# Patient Record
Sex: Female | Born: 1965
Health system: Southern US, Community
[De-identification: ages and names within clinical notes are randomized; demographics above are authoritative.]

## PROBLEM LIST (undated history)

## (undated) DIAGNOSIS — Z720 Tobacco use: Secondary | ICD-10-CM

## (undated) DIAGNOSIS — I429 Cardiomyopathy, unspecified: Secondary | ICD-10-CM

## (undated) DIAGNOSIS — I10 Essential (primary) hypertension: Secondary | ICD-10-CM

## (undated) HISTORY — DX: Cardiomyopathy, unspecified: I42.9

## (undated) HISTORY — DX: Tobacco use: Z72.0

## (undated) HISTORY — PX: CARDIAC CATHETERIZATION: SHX172

## (undated) HISTORY — DX: Essential (primary) hypertension: I10

---

## 2006-12-12 ENCOUNTER — Emergency Department: Payer: Self-pay | Admitting: Unknown Physician Specialty

## 2017-08-29 ENCOUNTER — Telehealth: Payer: Self-pay | Admitting: Oncology

## 2017-08-29 NOTE — Telephone Encounter (Signed)
08/24/17 [facility] phone message. pls call pts daughter

## 2017-08-29 NOTE — Telephone Encounter (Signed)
Please disregard message. Sent in error

## 2017-11-25 ENCOUNTER — Encounter: Payer: Self-pay | Admitting: Emergency Medicine

## 2017-11-25 ENCOUNTER — Observation Stay
Admission: EM | Admit: 2017-11-25 | Discharge: 2017-11-26 | Disposition: A | Discharge status: Home / Self Care | Payer: BLUE CROSS/BLUE SHIELD | Attending: Internal Medicine | Admitting: Internal Medicine

## 2017-11-25 ENCOUNTER — Other Ambulatory Visit: Payer: Self-pay

## 2017-11-25 DIAGNOSIS — F17210 Nicotine dependence, cigarettes, uncomplicated: Secondary | ICD-10-CM | POA: Insufficient documentation

## 2017-11-25 DIAGNOSIS — J309 Allergic rhinitis, unspecified: Secondary | ICD-10-CM | POA: Insufficient documentation

## 2017-11-25 DIAGNOSIS — R7989 Other specified abnormal findings of blood chemistry: Secondary | ICD-10-CM

## 2017-11-25 DIAGNOSIS — E669 Obesity, unspecified: Secondary | ICD-10-CM | POA: Insufficient documentation

## 2017-11-25 DIAGNOSIS — Z7982 Long term (current) use of aspirin: Secondary | ICD-10-CM | POA: Diagnosis not present

## 2017-11-25 DIAGNOSIS — E785 Hyperlipidemia, unspecified: Secondary | ICD-10-CM | POA: Insufficient documentation

## 2017-11-25 DIAGNOSIS — I10 Essential (primary) hypertension: Secondary | ICD-10-CM | POA: Diagnosis not present

## 2017-11-25 DIAGNOSIS — J209 Acute bronchitis, unspecified: Secondary | ICD-10-CM | POA: Diagnosis not present

## 2017-11-25 DIAGNOSIS — F17200 Nicotine dependence, unspecified, uncomplicated: Secondary | ICD-10-CM | POA: Diagnosis not present

## 2017-11-25 DIAGNOSIS — J019 Acute sinusitis, unspecified: Secondary | ICD-10-CM

## 2017-11-25 DIAGNOSIS — R946 Abnormal results of thyroid function studies: Secondary | ICD-10-CM | POA: Diagnosis not present

## 2017-11-25 DIAGNOSIS — Z79899 Other long term (current) drug therapy: Secondary | ICD-10-CM | POA: Diagnosis not present

## 2017-11-25 DIAGNOSIS — R748 Abnormal levels of other serum enzymes: Secondary | ICD-10-CM | POA: Insufficient documentation

## 2017-11-25 DIAGNOSIS — I160 Hypertensive urgency: Principal | ICD-10-CM | POA: Insufficient documentation

## 2017-11-25 DIAGNOSIS — R778 Other specified abnormalities of plasma proteins: Secondary | ICD-10-CM

## 2017-11-25 DIAGNOSIS — Z6831 Body mass index (BMI) 31.0-31.9, adult: Secondary | ICD-10-CM | POA: Diagnosis not present

## 2017-11-25 LAB — CBC WITH DIFFERENTIAL/PLATELET
BASOS ABS: 0 10*3/uL (ref 0–0.1)
BASOS PCT: 1 %
EOS PCT: 1 %
Eosinophils Absolute: 0 10*3/uL (ref 0–0.7)
HCT: 49.7 % — ABNORMAL HIGH (ref 35.0–47.0)
Hemoglobin: 16.7 g/dL — ABNORMAL HIGH (ref 12.0–16.0)
Lymphocytes Relative: 22 %
Lymphs Abs: 0.6 10*3/uL — ABNORMAL LOW (ref 1.0–3.6)
MCH: 32 pg (ref 26.0–34.0)
MCHC: 33.5 g/dL (ref 32.0–36.0)
MCV: 95.6 fL (ref 80.0–100.0)
MONO ABS: 0.2 10*3/uL (ref 0.2–0.9)
Monocytes Relative: 7 %
Neutro Abs: 2 10*3/uL (ref 1.4–6.5)
Neutrophils Relative %: 69 %
PLATELETS: 111 10*3/uL — AB (ref 150–440)
RBC: 5.2 MIL/uL (ref 3.80–5.20)
RDW: 13.2 % (ref 11.5–14.5)
WBC: 2.9 10*3/uL — ABNORMAL LOW (ref 3.6–11.0)

## 2017-11-25 LAB — URINALYSIS, COMPLETE (UACMP) WITH MICROSCOPIC
Bacteria, UA: NONE SEEN
Bilirubin Urine: NEGATIVE
GLUCOSE, UA: NEGATIVE mg/dL
KETONES UR: 5 mg/dL — AB
LEUKOCYTES UA: NEGATIVE
Nitrite: NEGATIVE
PROTEIN: NEGATIVE mg/dL
Specific Gravity, Urine: 1.01 (ref 1.005–1.030)
WBC, UA: NONE SEEN WBC/hpf (ref 0–5)
pH: 6 (ref 5.0–8.0)

## 2017-11-25 LAB — TSH
TSH: 3.717 u[IU]/mL (ref 0.350–4.500)
TSH: 5.503 u[IU]/mL — ABNORMAL HIGH (ref 0.350–4.500)

## 2017-11-25 LAB — COMPREHENSIVE METABOLIC PANEL
ALBUMIN: 4.5 g/dL (ref 3.5–5.0)
ALK PHOS: 86 U/L (ref 38–126)
ALT: 24 U/L (ref 14–54)
ANION GAP: 9 (ref 5–15)
AST: 31 U/L (ref 15–41)
BILIRUBIN TOTAL: 0.8 mg/dL (ref 0.3–1.2)
BUN: 10 mg/dL (ref 6–20)
CALCIUM: 9.2 mg/dL (ref 8.9–10.3)
CO2: 26 mmol/L (ref 22–32)
CREATININE: 0.74 mg/dL (ref 0.44–1.00)
Chloride: 100 mmol/L — ABNORMAL LOW (ref 101–111)
GFR calc non Af Amer: >60 mL/min (ref >60)
GLUCOSE: 123 mg/dL — AB (ref 65–99)
Potassium: 4 mmol/L (ref 3.5–5.1)
SODIUM: 135 mmol/L (ref 135–145)
TOTAL PROTEIN: 7.6 g/dL (ref 6.5–8.1)

## 2017-11-25 LAB — URINE DRUG SCREEN, QUALITATIVE (ARMC ONLY)
AMPHETAMINES, UR SCREEN: NOT DETECTED
BENZODIAZEPINE, UR SCRN: NOT DETECTED
Barbiturates, Ur Screen: NOT DETECTED
Cannabinoid 50 Ng, Ur ~~LOC~~: NOT DETECTED
Cocaine Metabolite,Ur ~~LOC~~: NOT DETECTED
MDMA (ECSTASY) UR SCREEN: NOT DETECTED
METHADONE SCREEN, URINE: NOT DETECTED
OPIATE, UR SCREEN: NOT DETECTED
Phencyclidine (PCP) Ur S: NOT DETECTED
Tricyclic, Ur Screen: NOT DETECTED

## 2017-11-25 LAB — RAPID HIV SCREEN (HIV 1/2 AB+AG)
HIV 1/2 ANTIBODIES: NONREACTIVE
HIV-1 P24 Antigen - HIV24: NONREACTIVE

## 2017-11-25 LAB — TROPONIN I
Troponin I: 0.07 ng/mL (ref <0.03)
Troponin I: 0.07 ng/mL (ref <0.03)

## 2017-11-25 MED ORDER — ACETAMINOPHEN 650 MG RE SUPP: 650.0000 mg | Freq: Four times a day (QID) | RECTAL | Status: DC | PRN

## 2017-11-25 MED ORDER — NICOTINE 14 MG/24HR TD PT24
14.0000 mg | MEDICATED_PATCH | Freq: Every day | TRANSDERMAL | Status: DC
  Filled 2017-11-25: qty 1

## 2017-11-25 MED ORDER — LABETALOL HCL 5 MG/ML IV SOLN
20.0000 mg | Freq: Once | INTRAVENOUS | Status: AC
  Administered 2017-11-25: 20 mg via INTRAVENOUS

## 2017-11-25 MED ORDER — ACETAMINOPHEN 325 MG PO TABS: 650.0000 mg | ORAL_TABLET | Freq: Four times a day (QID) | ORAL | Status: DC | PRN

## 2017-11-25 MED ORDER — DIAZEPAM 5 MG PO TABS
5.0000 mg | ORAL_TABLET | Freq: Once | ORAL | Status: AC
Start: 2017-11-25 — End: 2017-11-25
  Administered 2017-11-25: 5 mg via ORAL
  Filled 2017-11-25: qty 1

## 2017-11-25 MED ORDER — LABETALOL HCL 5 MG/ML IV SOLN
INTRAVENOUS | Status: AC
  Administered 2017-11-25: 20 mg via INTRAVENOUS
  Filled 2017-11-25: qty 4

## 2017-11-25 MED ORDER — LORATADINE 10 MG PO TABS
10.0000 mg | ORAL_TABLET | Freq: Every day | ORAL | Status: DC
  Administered 2017-11-26: 10 mg via ORAL
  Filled 2017-11-25: qty 1

## 2017-11-25 MED ORDER — ASPIRIN 81 MG PO CHEW
CHEWABLE_TABLET | ORAL | Status: AC
  Filled 2017-11-25: qty 4

## 2017-11-25 MED ORDER — POLYETHYLENE GLYCOL 3350 17 G PO PACK: 17.0000 g | PACK | Freq: Every day | ORAL | Status: DC | PRN

## 2017-11-25 MED ORDER — ONDANSETRON HCL 4 MG/2ML IJ SOLN: 4.0000 mg | Freq: Four times a day (QID) | INTRAMUSCULAR | Status: DC | PRN

## 2017-11-25 MED ORDER — METOPROLOL TARTRATE 25 MG/10 ML ORAL SUSPENSION: 50.0000 mg | Freq: Two times a day (BID) | ORAL | Status: DC

## 2017-11-25 MED ORDER — MORPHINE SULFATE (PF) 2 MG/ML IV SOLN: 2.0000 mg | INTRAVENOUS | Status: DC | PRN

## 2017-11-25 MED ORDER — ASPIRIN EC 81 MG PO TBEC
81.0000 mg | DELAYED_RELEASE_TABLET | Freq: Every day | ORAL | Status: DC
  Administered 2017-11-25 – 2017-11-26 (×2): 81 mg via ORAL
  Filled 2017-11-25 (×2): qty 1

## 2017-11-25 MED ORDER — NITROGLYCERIN 0.4 MG SL SUBL: 0.4000 mg | SUBLINGUAL_TABLET | SUBLINGUAL | Status: DC | PRN

## 2017-11-25 MED ORDER — IBUPROFEN 400 MG PO TABS: 400.0000 mg | ORAL_TABLET | Freq: Four times a day (QID) | ORAL | Status: DC | PRN

## 2017-11-25 MED ORDER — LISINOPRIL 10 MG PO TABS
10.0000 mg | ORAL_TABLET | Freq: Two times a day (BID) | ORAL | Status: DC
  Administered 2017-11-25 – 2017-11-26 (×2): 10 mg via ORAL
  Filled 2017-11-25 (×2): qty 1

## 2017-11-25 MED ORDER — ENOXAPARIN SODIUM 40 MG/0.4ML ~~LOC~~ SOLN
40.0000 mg | SUBCUTANEOUS | Status: DC
  Administered 2017-11-25: 40 mg via SUBCUTANEOUS
  Filled 2017-11-25: qty 0.4

## 2017-11-25 MED ORDER — ONDANSETRON HCL 4 MG PO TABS: 4.0000 mg | ORAL_TABLET | Freq: Four times a day (QID) | ORAL | Status: DC | PRN

## 2017-11-25 MED ORDER — SALINE SPRAY 0.65 % NA SOLN
1.0000 | NASAL | Status: DC | PRN
  Administered 2017-11-26: 1 via NASAL
  Filled 2017-11-25: qty 44

## 2017-11-25 MED ORDER — HYDRALAZINE HCL 20 MG/ML IJ SOLN: 10.0000 mg | INTRAMUSCULAR | Status: DC | PRN

## 2017-11-25 MED ORDER — ASPIRIN 81 MG PO CHEW
324.0000 mg | CHEWABLE_TABLET | Freq: Once | ORAL | Status: AC
  Administered 2017-11-25: 324 mg via ORAL

## 2017-11-25 MED ORDER — METOPROLOL TARTRATE 50 MG PO TABS
50.0000 mg | ORAL_TABLET | Freq: Two times a day (BID) | ORAL | Status: DC
  Administered 2017-11-25 – 2017-11-26 (×2): 50 mg via ORAL
  Filled 2017-11-25 (×2): qty 1

## 2017-11-25 NOTE — ED Provider Notes (Signed)
[facility] Emergency Department Provider Note       Time seen: ----------------------------------------- 3:45 PM on 11/25/2017 -----------------------------------------   I have reviewed the triage vital signs and the nursing notes.  HISTORY   Chief Complaint Hypertension    HPI Victoria Avery is a 51 y.o. female with no significant past medical history who presents to the ED for hypertension.  [facility] for [facility] problems where she is having pressure in her sinuses, ear pain and runny nose.  Patient states she does not have a primary care doctor and does not know if she actually has high blood pressure.  She was found to have high blood pressure at the clinic today and was sent to the ER for further evaluation.  Blood pressure was well into the 200s when it was checked there.  She denies any fevers, chills, chest pain, shortness of breath or other complaints at this time.  History reviewed. No pertinent past medical history.  There are no active problems to display for this patient.   Past Surgical History:  Procedure Laterality Date  . CESAREAN SECTION      Allergies Patient has no known allergies.  Social History Social History   Tobacco Use  . Smoking status: Current Every Day Smoker    Packs/day: 1.00    Types: Cigarettes  . Smokeless tobacco: Never Used  Substance Use Topics  . Alcohol use: Yes  . Drug use: No    Review of Systems Constitutional: Negative for fever. Eyes: Negative for vision changes ENT: Positive for sinus congestion and pressure Cardiovascular: Negative for chest pain. Respiratory: Negative for shortness of breath. Gastrointestinal: Negative for abdominal pain, vomiting and diarrhea. Genitourinary: Negative for dysuria. Musculoskeletal: Negative for back pain. Skin: Negative for rash. Neurological: Negative for headaches, focal weakness or numbness.  All systems  negative/normal/unremarkable except as stated in the HPI  ____________________________________________   PHYSICAL EXAM:  VITAL SIGNS: ED Triage Vitals  Enc Vitals Group     BP 11/25/17 1254 (!) 197/137     Pulse Rate 11/25/17 1254 (!) 117     Resp 11/25/17 1254 18     Temp 11/25/17 1254 99.6 F (37.6 C)     Temp Source 11/25/17 1254 Oral     SpO2 11/25/17 1254 97 %     Weight 11/25/17 1255 180 lb (81.6 kg)     Height 11/25/17 1255 5\' 3"  (1.6 m)     Head Circumference --      Peak Flow --      Pain Score --      Pain Loc --      Pain Edu? --      Excl. in GC? --     Constitutional: Alert and oriented. Well appearing and in no distress. Eyes: Conjunctivae are normal. Normal extraocular movements. ENT   Head: Normocephalic and atraumatic.   Nose: No congestion/rhinnorhea.   Mouth/Throat: Mucous membranes are moist.   Neck: No stridor. Cardiovascular: Rapid rate, regular rhythm. No murmurs, rubs, or gallops. Respiratory: Normal respiratory effort without tachypnea nor retractions. Breath sounds are clear and equal bilaterally. No wheezes/rales/rhonchi. Gastrointestinal: Soft and nontender. Normal bowel sounds Musculoskeletal: Nontender with normal range of motion in extremities. No lower extremity tenderness nor edema. Neurologic:  Normal speech and language. No gross focal neurologic deficits are appreciated.  Skin:  Skin is warm, dry and intact. No rash noted. Psychiatric: Mood and affect are normal. Speech and behavior are  normal.  ____________________________________________  EKG: Interpreted by me.  Sinus tachycardia with a rate of 121 bpm, normal PR interval, normal QRS, normal QT.  Nonspecific T wave changes.  ____________________________________________  ED COURSE:  As part of my medical decision making, I reviewed the following data within the electronic MEDICAL RECORD NUMBER History obtained from family if available, nursing notes, old chart and ekg, as  well as notes from prior ED visits. Patient presented for hypertension and was found to be tachycardic, we will assess with labs and imaging as indicated at this time.   Procedures ____________________________________________   LABS (pertinent positives/negatives)  Labs Reviewed  CBC WITH DIFFERENTIAL/PLATELET - Abnormal; Notable for the following components:      Result Value   WBC 2.9 (*)    Hemoglobin 16.7 (*)    HCT 49.7 (*)    Platelets 111 (*)    Lymphs Abs 0.6 (*)    All other components within normal limits  COMPREHENSIVE METABOLIC PANEL - Abnormal; Notable for the following components:   Chloride 100 (*)    Glucose, Bld 123 (*)    All other components within normal limits  TROPONIN I - Abnormal; Notable for the following components:   Troponin I 0.07 (*)    All other components within normal limits  TSH  RAPID HIV SCREEN (HIV 1/2 AB+AG)  URINE DRUG SCREEN, QUALITATIVE ([facility] ONLY)  URINALYSIS, COMPLETE (UACMP) WITH MICROSCOPIC  ____________________________________________  CRITICAL CARE Performed by: Emily FilbertWilliams, Jonathan E   Total critical care time: 30 minutes  Critical care time was exclusive of separately billable procedures and treating other patients.  Critical care was necessary to treat or prevent imminent or life-threatening deterioration.  Critical care was time spent personally by me on the following activities: development of treatment plan with patient and/or surrogate as well as nursing, discussions with consultants, evaluation of patient's response to treatment, examination of patient, obtaining history from patient or surrogate, ordering and performing treatments and interventions, ordering and review of laboratory studies, ordering and review of radiographic studies, pulse oximetry and re-evaluation of patient's condition.  DIFFERENTIAL DIAGNOSIS   Essential hypertension, anxiety, dehydration, electrolyte abnormality,  FINAL ASSESSMENT AND  PLAN  Hypertension, elevated troponin   Plan: Patient had presented for reevaluation concerning hypertension that was found earlier today. Patient's labs reveal slightly low white blood cell count of uncertain etiology.  She has recently had a viral illness.  Were concerning was her elevated troponin of 0.07 for which we will be treating with IV labetalol.  We will also give her oral aspirin.  Due to the abnormal findings and markedly elevated blood pressure I will discussed with the hospitalist for admission.   Emily FilbertWilliams, Jonathan E, MD   Note: This note was generated in part or whole with voice recognition software. Voice recognition is usually quite accurate but there are transcription errors that can and very often do occur. I apologize for any typographical errors that were not detected and corrected.     Emily FilbertWilliams, Jonathan E, MD 11/25/17 [phone]

## 2017-11-25 NOTE — H&P (Signed)
[facility] [place] [facility]   PATIENT NAME: Victoria Avery    MR#:  [id number]  DATE OF BIRTH:  [birth year]  DATE OF ADMISSION:  11/25/2017  PRIMARY CARE PHYSICIAN: Patient, No Pcp Per   REQUESTING/REFERRING PHYSICIAN:   CHIEF COMPLAINT:   Chief Complaint  Patient presents with  . Hypertension    HISTORY OF PRESENT ILLNESS: Victoria Avery  is a 51 y.o. female with a known history of allergic rhinitis and tobacco smoking abuse presenting with hypertension, patient's blood pressure noted to be in the 200s systolically-referred to ER for further evaluation/care, only complaint is of sinus disease/chronic postnasal drip with sinus problems, patient denies sinus tenderness/face pain at this time, in the emergency room patient noted to have sinus tachycardia on EKG, systolic blood pressure greater than 220/1 100s, improved on IV labetalol, white count 2.9, hemoglobin 16, TSH was normal, troponin 0.07, patient evaluated emergency room, husband at the bedside, patient denies chest pain/shortness of breath, patient is now admitted for acute hypertensive urgency, acute elevated troponins.  PAST MEDICAL HISTORY:  History reviewed. No pertinent past medical history.  PAST SURGICAL HISTORY:  Past Surgical History:  Procedure Laterality Date  . CESAREAN SECTION      SOCIAL HISTORY:  Social History   Tobacco Use  . Smoking status: Current Every Day Smoker    Packs/day: 1.00    Types: Cigarettes  . Smokeless tobacco: Never Used  Substance Use Topics  . Alcohol use: Yes    FAMILY HISTORY: No family history on file.  DRUG ALLERGIES: No Known Allergies  REVIEW OF SYSTEMS:   CONSTITUTIONAL: No fever, fatigue or weakness.  EYES: No blurred or double vision.  EARS, NOSE, AND THROAT: No tinnitus or ear pain.  Postnasal drip, chronic allergies RESPIRATORY: No cough, shortness of breath, wheezing or hemoptysis.  CARDIOVASCULAR: No chest pain, orthopnea, edema.   GASTROINTESTINAL: No nausea, vomiting, diarrhea or abdominal pain.  GENITOURINARY: No dysuria, hematuria.  ENDOCRINE: No polyuria, nocturia,  HEMATOLOGY: No anemia, easy bruising or bleeding SKIN: No rash or lesion. MUSCULOSKELETAL: No joint pain or arthritis.   NEUROLOGIC: No tingling, numbness, weakness.  PSYCHIATRY: No anxiety or depression.   MEDICATIONS AT HOME:  Prior to Admission medications   Not on File      PHYSICAL EXAMINATION:   VITAL SIGNS: Blood pressure (!) 194/124, pulse (!) 137, temperature 99.6 F (37.6 C), temperature source Oral, resp. rate 18, height 5\' 3"  (1.6 m), weight 81.6 kg (180 lb), last menstrual period 11/25/2017, SpO2 98 %.  GENERAL:  [age] y.o.-year-old patient lying in the bed with no acute distress.  Obese, nontoxic-appearing EYES: Pupils equal, round, reactive to light and accommodation. No scleral icterus. Extraocular muscles intact.  HEENT: Head atraumatic, normocephalic. Oropharynx and nasopharynx clear.  NECK:  Supple, no jugular venous distention. No thyroid enlargement, no tenderness.  LUNGS: Normal breath sounds bilaterally, no wheezing, rales,rhonchi or crepitation. No use of accessory muscles of respiration.  CARDIOVASCULAR: S1, S2 normal. No murmurs, rubs, or gallops.  ABDOMEN: Soft, nontender, nondistended. Bowel sounds present. No organomegaly or mass.  EXTREMITIES: No pedal edema, cyanosis, or clubbing.  NEUROLOGIC: Cranial nerves II through XII are intact. MAES.  Gait not checked.  PSYCHIATRIC: The patient is alert and oriented x 3.  SKIN: No obvious rash, lesion, or ulcer.   LABORATORY PANEL:   CBC Recent Labs  Lab 11/25/17 1528  WBC 2.9*  HGB 16.7*  HCT 49.7*  PLT 111*  MCV 95.6  MCH 32.0  MCHC 33.5  RDW 13.2  LYMPHSABS 0.6*  MONOABS 0.2  EOSABS 0.0  BASOSABS 0.0   ------------------------------------------------------------------------------------------------------------------  Chemistries  Recent Labs  Lab  11/25/17 1528  NA 135  K 4.0  CL 100*  CO2 26  GLUCOSE 123*  BUN 10  CREATININE 0.74  CALCIUM 9.2  AST 31  ALT 24  ALKPHOS 86  BILITOT 0.8   ------------------------------------------------------------------------------------------------------------------ estimated creatinine clearance is 86.1 mL/min (by C-G formula based on SCr of 0.74 mg/dL). ------------------------------------------------------------------------------------------------------------------ Recent Labs    11/25/17 1528  TSH 3.717     Coagulation profile No results for input(s): INR, PROTIME in the last 168 hours. ------------------------------------------------------------------------------------------------------------------- No results for input(s): DDIMER in the last 72 hours. -------------------------------------------------------------------------------------------------------------------  Cardiac Enzymes Recent Labs  Lab 11/25/17 1528  TROPONINI 0.07*   ------------------------------------------------------------------------------------------------------------------ Invalid input(s): POCBNP  ---------------------------------------------------------------------------------------------------------------  Urinalysis No results found for: COLORURINE, APPEARANCEUR, LABSPEC, PHURINE, GLUCOSEU, HGBUR, BILIRUBINUR, KETONESUR, PROTEINUR, UROBILINOGEN, NITRITE, LEUKOCYTESUR   RADIOLOGY: No results found.  EKG: Orders placed or performed during the hospital encounter of 11/25/17  . ED EKG  . ED EKG    IMPRESSION AND PLAN: 1 Acute hypertensive urgency Admit to regular nursing floor bed, start Lopressor, lisinopril, as needed hydralazine for systolic blood pressure greater than 160, low-sodium/cardiac diet, vitals per routine, make changes as per necessary  2 acute elevated troponins Most likely secondary to above No clinical signs/symptoms of acute coronary syndrome We will cycle a set of  cardiac enzymes, check echocardiogram, consult cardiology for expert opinion, and blood pressure control per above  3 chronic tobacco smoking abuse/dependency Nicotine patch and cessation counseling ordered  4 chronic allergic rhinitis Claritin daily Nasal saline as needed  5 obesity, chronic Most likely secondary to excess calories  Full code  Condition stable Prognosis good DVT prophylaxis with Lovenox subcu Disposition Home in 1-2 days barring any complications    All the records are reviewed and case discussed with ED provider. Management plans discussed with the patient, family and they are in agreement.  CODE STATUS: Code Status History    This patient does not have a recorded code status. Please follow your organizational policy for patients in this situation.       TOTAL TIME TAKING CARE OF THIS PATIENT: 45 minutes.    Evelena AsaMontell D Mccayla Shimada M.D on 11/25/2017   Between 7am to 6pm - Pager - [phone]  After 6pm go to [email or web address] - password [email or web address]  [facility]  Office  [phone]  CC: Primary care physician; Patient, No Pcp Per   Note: This dictation was prepared with Dragon dictation along with smaller phrase technology. Any transcriptional errors that result from this process are unintentional.

## 2017-11-25 NOTE — Progress Notes (Signed)
Pt received from ED. Admission profile and assessment completed. Pt acclimated to the room, call system, and how to order meals through dining services. Inquired with admitting provider regarding the need for telemetry, he stated the pt did not need cardiac monitoring. Reviewed plan of care with patient and family.

## 2017-11-25 NOTE — ED Notes (Signed)
Pt reports high blood pressure. No PCP. States she had high BP in pregnancy x 21 years ago. Denies chest pain, shortness of breath. Does report pain while coughing. No vomiting, no diarrhea. ++ anxiety.

## 2017-11-25 NOTE — ED Notes (Signed)
Lab reports they will add on Rapid HIV test at this time

## 2017-11-25 NOTE — ED Triage Notes (Signed)
Pt comes into the ED via POV c/o HTN.  [facility] problems where she is having pressure on her sinus cavities, ear pain, and runny nose.  Patient states she doesn't have a pcp and doesn't know if she has high blood pressure.  Patient states she did have it when she was pregnant with her son, but she does not check it now.  Patient has a BP of 220/140 [facility].  Denies any chest pain, shortness of breath, dizziness, or blurred vision.  Patient asymptomatic at this time and in NAD.

## 2017-11-25 NOTE — ED Triage Notes (Signed)
FIRST NURSE NOTE-here for elevated bp. Went to doctor for sinus problems and bp was elevated so told pt to come to ED.  No hx of htn but doesn't go to doctor. 240/140. Denies symptoms.

## 2017-11-25 NOTE — Consult Note (Signed)
Cardiology Consultation:   Patient ID: KENYON ESHLEMAN; [id number]; 10/04/1966   Admit date: 11/25/2017 Date of Consult: 11/25/2017  Primary Care Provider: Patient, No Pcp Per Primary Cardiologist: New to [facility] Physician requesting consult: Dr. Katheren Shams Reason for consult: Malignant hypertension, elevated troponin   Patient Profile:   Victoria Avery is a 51 y.o. female with a hx  Of smoking, who continues to smoke 1 pack/day Presenting with sinus pressure, congestion, pain in her face, Referred from urgent care for markedly elevated blood pressure   History of Present Illness:   Ms. Victoria Avery presented to the emergency room noted to have sinus tachycardia rate 120 bpm, markedly elevated blood pressure 220/110 She was given IV labetalol, hydralazine with minimal improvement of her blood pressure Initial troponin 0 0.07 Admitted to the hospital for further management of her acute hypertensive urgency and rule out MI  Patient denies any chest pain, shortness of breath Main complaint is sinus pressure and pain  She does not have primary care, does not check her blood pressure at home Reports that she has been in usual state of health until recent sinus pain  Currently on the floor feels well, continued sinus drainage, cough Reports cough has been significant, has "pulled" some of her stomach muscles  History reviewed. No pertinent past medical history.  Past Surgical History:  Procedure Laterality Date  . CESAREAN SECTION       Home Medications:  Prior to Admission medications   Not on File    Inpatient Medications: Scheduled Meds: . aspirin      . aspirin EC  81 mg Oral Daily  . enoxaparin (LOVENOX) injection  40 mg Subcutaneous Q24H  . lisinopril  10 mg Oral BID  . [START ON 11/26/2017] loratadine  10 mg Oral Daily  . metoprolol tartrate  50 mg Oral BID  . nicotine  14 mg Transdermal Daily   Continuous Infusions:  PRN Meds: acetaminophen **OR**  acetaminophen, hydrALAZINE, ibuprofen, morphine injection, nitroGLYCERIN, ondansetron **OR** ondansetron (ZOFRAN) IV, polyethylene glycol, sodium chloride  Allergies:   No Known Allergies  Social History:   Social History   Socioeconomic History  . Marital status: Married    Spouse name: Not on file  . Number of children: Not on file  . Years of education: Not on file  . Highest education level: Not on file  Social Needs  . Financial resource strain: Not on file  . Food insecurity - worry: Not on file  . Food insecurity - inability: Not on file  . Transportation needs - medical: Not on file  . Transportation needs - non-medical: Not on file  Occupational History  . Not on file  Tobacco Use  . Smoking status: Current Every Day Smoker    Packs/day: 1.00    Types: Cigarettes  . Smokeless tobacco: Never Used  Substance and Sexual Activity  . Alcohol use: Yes  . Drug use: No  . Sexual activity: Not on file  Other Topics Concern  . Not on file  Social History Narrative  . Not on file    Family History:   No family history on file.   ROS:  Please see the history of present illness.  Review of Systems  Constitution: Negative for diaphoresis, fever, weakness, malaise/fatigue and night sweats.  HENT: Negative.        Sinus pressure and pain  Eyes: Negative.   Cardiovascular: Negative for chest pain, claudication, cyanosis, dyspnea on exertion, irregular heartbeat, leg swelling, near-syncope, orthopnea, palpitations  and paroxysmal nocturnal dyspnea.  Respiratory: Negative for cough, shortness of breath, sleep disturbances due to breathing and wheezing.   Endocrine: Negative.   Hematologic/Lymphatic: Negative.   Skin: Negative.   Musculoskeletal: Negative for falls, joint pain, joint swelling and myalgias.  Gastrointestinal: Negative.   Neurological: Negative for difficulty with concentration, excessive daytime sleepiness, dizziness, focal weakness, light-headedness and  numbness.  Psychiatric/Behavioral: Negative.    All other ROS reviewed and negative.     Physical Exam/Data:   Vitals:   11/25/17 1723 11/25/17 1754 11/25/17 1817 11/25/17 1929  BP: (!) 171/105 (!) 166/100 (!) 167/100 (!) 157/82  Pulse: 93  93 92  Resp: 18  18 17   Temp:   99.2 F (37.3 C) 99.1 F (37.3 C)  TempSrc:   Oral Oral  SpO2: 94% 94% 97% 94%  Weight:      Height:       No intake or output data in the 24 hours ending 11/25/17 2154 Filed Weights   11/25/17 1255  Weight: 180 lb (81.6 kg)   Body mass index is 31.89 kg/m.  General:  Well nourished, well developed, in no acute distress HEENT: normal Lymph: no adenopathy Neck: no JVD Endocrine:  No thryomegaly Vascular: No carotid bruits; FA pulses 2+ bilaterally without bruits  Cardiac:  normal S1, S2; RRR; no murmur  Lungs:  clear to auscultation bilaterally, no wheezing, rhonchi or rales  Abd: soft, nontender, no hepatomegaly  Ext: no edema Musculoskeletal:  No deformities, BUE and BLE strength normal and equal Skin: warm and dry  Neuro:  CNs 2-12 intact, no focal abnormalities noted Psych:  Normal affect   EKG:  The EKG was personally reviewed and demonstrates: Sinus tachycardia rate 121 bpm with nonspecific T wave abnormality anterolateral leads Telemetry:  Telemetry was personally reviewed and demonstrates: Normal sinus rhythm  Relevant CV Studies: Echocardiogram pending  Laboratory Data:  Chemistry Recent Labs  Lab 11/25/17 1528  NA 135  K 4.0  CL 100*  CO2 26  GLUCOSE 123*  BUN 10  CREATININE 0.74  CALCIUM 9.2  GFRNONAA >60  GFRAA >60  ANIONGAP 9    Recent Labs  Lab 11/25/17 1528  PROT 7.6  ALBUMIN 4.5  AST 31  ALT 24  ALKPHOS 86  BILITOT 0.8   Hematology Recent Labs  Lab 11/25/17 1528  WBC 2.9*  RBC 5.20  HGB 16.7*  HCT 49.7*  MCV 95.6  MCH 32.0  MCHC 33.5  RDW 13.2  PLT 111*   Cardiac Enzymes Recent Labs  Lab 11/25/17 1528 11/25/17 1815  TROPONINI 0.07* 0.07*    No results for input(s): TROPIPOC in the last 168 hours.  BNPNo results for input(s): BNP, PROBNP in the last 168 hours.  DDimer No results for input(s): DDIMER in the last 168 hours.  Radiology/Studies:  No results found.  Assessment and Plan:   1 acute hypertensive urgency.  Likely exacerbated by sinus pain and pressure, possible sinusitis Received labetalol and hydralazine in the emergency room Started on metoprolol, lisinopril on the floor Mild improvement in her pressures Currently running 150 up to 160s systolic diastolic of 100 -Consider changing metoprolol tartrate to metoprolol succinate 50 mg daily  -Continue lisinopril or ARB, can titrate dose upwards as needed recommended she purchase blood pressure cuff, Outpatient monitoring with follow-up in clinic for further medication titration  2.  Elevated troponin  0.07 x 2, likely minimal elevation in setting of severe hypertension Risk factors include smoking but she is asymptomatic, likely does  not warrant further ischemic workup at this time including stress testing.  Echocardiogram has been ordered to evaluate ejection fraction  3.  Smoker We have encouraged her to continue to work on weaning her cigarettes and smoking cessation. She will continue to work on this and does not want any assistance with chantix.   4. Sinusitis Symptoms may warrant short course of antibiotics Will defer to primary medicine service   Total encounter time more than 110 minutes  Greater than 50% was spent in counseling and coordination of care with the patient   For questions or updates, please contact [facility] Please consult [email or web address] for contact info under Cardiology/STEMI.   Signed, Julien Nordmannimothy Gollan, MD  11/25/2017 9:54 PM

## 2017-11-26 ENCOUNTER — Observation Stay (HOSPITAL_BASED_OUTPATIENT_CLINIC_OR_DEPARTMENT_OTHER)
Admit: 2017-11-26 | Discharge: 2017-11-26 | Disposition: A | Discharge status: Home / Self Care | Payer: BLUE CROSS/BLUE SHIELD | Attending: Cardiovascular Disease | Admitting: Cardiovascular Disease

## 2017-11-26 DIAGNOSIS — I10 Essential (primary) hypertension: Secondary | ICD-10-CM | POA: Diagnosis not present

## 2017-11-26 DIAGNOSIS — J309 Allergic rhinitis, unspecified: Secondary | ICD-10-CM | POA: Diagnosis not present

## 2017-11-26 DIAGNOSIS — J329 Chronic sinusitis, unspecified: Secondary | ICD-10-CM | POA: Diagnosis not present

## 2017-11-26 DIAGNOSIS — I340 Nonrheumatic mitral (valve) insufficiency: Secondary | ICD-10-CM

## 2017-11-26 DIAGNOSIS — J019 Acute sinusitis, unspecified: Secondary | ICD-10-CM | POA: Diagnosis not present

## 2017-11-26 DIAGNOSIS — I160 Hypertensive urgency: Secondary | ICD-10-CM | POA: Diagnosis not present

## 2017-11-26 DIAGNOSIS — R748 Abnormal levels of other serum enzymes: Secondary | ICD-10-CM | POA: Diagnosis not present

## 2017-11-26 LAB — TROPONIN I
TROPONIN I: 0.06 ng/mL — AB (ref <0.03)
Troponin I: 0.06 ng/mL (ref <0.03)

## 2017-11-26 LAB — LIPID PANEL
CHOLESTEROL: 200 mg/dL (ref 0–200)
HDL: 30 mg/dL — AB (ref >40)
LDL CALC: 126 mg/dL — AB (ref 0–99)
TRIGLYCERIDES: 219 mg/dL — AB (ref <150)
Total CHOL/HDL Ratio: 6.7 RATIO
VLDL: 44 mg/dL — ABNORMAL HIGH (ref 0–40)

## 2017-11-26 LAB — ECHOCARDIOGRAM COMPLETE
Height: 63 in
Weight: 2880 oz

## 2017-11-26 MED ORDER — ASPIRIN 81 MG PO TBEC: 81.0000 mg | DELAYED_RELEASE_TABLET | Freq: Every day | ORAL | 0 refills | Status: DC

## 2017-11-26 MED ORDER — LISINOPRIL-HYDROCHLOROTHIAZIDE 20-12.5 MG PO TABS: 1.0000 | ORAL_TABLET | Freq: Every day | ORAL | 11 refills | Status: DC

## 2017-11-26 MED ORDER — AMOXICILLIN-POT CLAVULANATE 875-125 MG PO TABS: 1.0000 | ORAL_TABLET | Freq: Two times a day (BID) | ORAL | 0 refills | Status: DC

## 2017-11-26 MED ORDER — METOPROLOL SUCCINATE ER 50 MG PO TB24: 50.0000 mg | ORAL_TABLET | Freq: Every day | ORAL | 0 refills | Status: DC

## 2017-11-26 MED ORDER — AMOXICILLIN-POT CLAVULANATE 875-125 MG PO TABS
1.0000 | ORAL_TABLET | Freq: Two times a day (BID) | ORAL | Status: DC
  Administered 2017-11-26: 1 via ORAL
  Filled 2017-11-26: qty 1

## 2017-11-26 MED ORDER — ATORVASTATIN CALCIUM 40 MG PO TABS: 40.0000 mg | ORAL_TABLET | Freq: Every day | ORAL | 11 refills | Status: DC

## 2017-11-26 MED ORDER — ALBUTEROL SULFATE HFA 108 (90 BASE) MCG/ACT IN AERS: 2.0000 | INHALATION_SPRAY | Freq: Four times a day (QID) | RESPIRATORY_TRACT | 2 refills | Status: DC | PRN

## 2017-11-26 MED ORDER — ATORVASTATIN CALCIUM 10 MG PO TABS: 10.0000 mg | ORAL_TABLET | Freq: Every day | ORAL | 11 refills | Status: DC

## 2017-11-26 NOTE — Discharge Instructions (Signed)
Hydrochlorothiazide, HCTZ; Lisinopril tablets What is this medicine? HYDROCHLOROTHIAZIDE; LISINOPRIL (hye droe klor oh THYE a zide; lyse IN oh pril) is a combination of a diuretic and an ACE inhibitor. It is used to treat high blood pressure. This medicine may be used for other purposes; ask your health care provider or pharmacist if you have questions. COMMON BRAND NAME(S): Prinzide, Zestoretic What should I tell my health care provider before I take this medicine? They need to know if you have any of these conditions: -bone marrow disease -decreased urine -heart or blood vessel disease -if you are on a special diet like a low salt diet -immune system problems, like lupus -kidney disease -liver disease -previous swelling of the tongue, face, or lips with difficulty breathing, difficulty swallowing, hoarseness, or tightening of the throat -recent heart attack or stroke -an unusual or allergic reaction to lisinopril, hydrochlorothiazide, sulfa drugs, other medicines, insect venom, foods, dyes, or preservatives -pregnant or trying to get pregnant -breast-feeding How should I use this medicine? Take this medicine by mouth with a glass of water. Follow the directions on the prescription label. You can take it with or without food. If it upsets your stomach, take it with food. Take your medicine at regular intervals. Do not take it more often than directed. Do not stop taking except on your doctor's advice. Talk to your pediatrician regarding the use of this medicine in children. Special care may be needed. Overdosage: If you think you have taken too much of this medicine contact a poison control center or emergency room at once. NOTE: This medicine is only for you. Do not share this medicine with others. What if I miss a dose? If you miss a dose, take it as soon as you can. If it is almost time for your next dose, take only that dose. Do not take double or extra doses. What may interact with  this medicine? Do not take this medication with any of the following medications: -sacubitril; valsartan This medicine may also interact with the following: -barbiturates like phenobarbital -blood pressure medicines -corticosteroids like prednisone -diabetic medications -diuretics, especially triamterene, spironolactone or amiloride -lithium -NSAIDs, medicines for pain and inflammation, like ibuprofen or naproxen -potassium salts or potassium supplements -prescription pain medicines -skeletal muscle relaxants like tubocurarine -some cholesterol lowering medications like cholestyramine or colestipol This list may not describe all possible interactions. Give your health care provider a list of all the medicines, herbs, non-prescription drugs, or dietary supplements you use. Also tell them if you smoke, drink alcohol, or use illegal drugs. Some items may interact with your medicine. What should I watch for while using this medicine? Visit your doctor or health care professional for regular checks on your progress. Check your blood pressure as directed. Ask your doctor or health care professional what your blood pressure should be and when you should contact him or her. Call your doctor or health care professional if you notice an irregular or fast heart beat. You must not get dehydrated. Ask your doctor or health care professional how much fluid you need to drink a day. Check with him or her if you get an attack of severe diarrhea, nausea and vomiting, or if you sweat a lot. The loss of too much body fluid can make it dangerous for you to take this medicine. Women should inform their doctor if they wish to become pregnant or think they might be pregnant. There is a potential for serious side effects to an unborn child. Talk to  your health care professional or pharmacist for more information. You may get drowsy or dizzy. Do not drive, use machinery, or do anything that needs mental alertness until  you know how this drug affects you. Do not stand or sit up quickly, especially if you are an older patient. This reduces the risk of dizzy or fainting spells. Alcohol can make you more drowsy and dizzy. Avoid alcoholic drinks. This medicine may affect your blood sugar level. If you have diabetes, check with your doctor or health care professional before changing the dose of your diabetic medicine. Avoid salt substitutes unless you are told otherwise by your doctor or health care professional. This medicine can make you more sensitive to the sun. Keep out of the sun. If you cannot avoid being in the sun, wear protective clothing and use sunscreen. Do not use sun lamps or tanning beds/booths. Do not treat yourself for coughs, colds, or pain while you are taking this medicine without asking your doctor or health care professional for advice. Some ingredients may increase your blood pressure. What side effects may I notice from receiving this medicine? Side effects that you should report to your doctor or health care professional as soon as possible: -changes in vision -confusion, dizziness, light headedness or fainting spells -decreased amount of urine passed -difficulty breathing or swallowing, hoarseness, or tightening of the throat -eye pain -fast or irregular heart beat, palpitations, or chest pain -muscle cramps -nausea and vomiting -persistent dry cough -redness, blistering, peeling or loosening of the skin, including inside the mouth -stomach pain -swelling of your face, lips, tongue, hands, or feet -unusual rash, bleeding or bruising, or pinpoint red spots on the skin -worsened gout pain -yellowing of the eyes or skin Side effects that usually do not require medical attention (report to your doctor or health care professional if they continue or are bothersome): -change in sex drive or performance -cough -headache This list may not describe all possible side effects. Call your doctor  for medical advice about side effects. You may report side effects to FDA at 1-800-FDA-1088. Where should I keep my medicine? Keep out of the reach of children. Store at room temperature between 20 and 25 degrees C (68 and 77 degrees F). Protect from moisture and excessive light. Keep container tightly closed. Throw away any unused medicine after the expiration date. NOTE: This sheet is a summary. It may not cover all possible information. If you have questions about this medicine, talk to your doctor, pharmacist, or health care provider.  2018 Elsevier/Gold Standard (2016-01-09 11:42:20) Metoprolol extended-release tablets What is this medicine? METOPROLOL (me TOE proe lole) is a beta-blocker. Beta-blockers reduce the workload on the heart and help it to beat more regularly. This medicine is used to treat high blood pressure and to prevent chest pain. It is also used to after a heart attack and to prevent an additional heart attack from occurring. This medicine may be used for other purposes; ask your health care provider or pharmacist if you have questions. COMMON BRAND NAME(S): toprol, Toprol XL What should I tell my health care provider before I take this medicine? They need to know if you have any of these conditions: -diabetes -heart or vessel disease like slow heart rate, worsening heart failure, heart block, sick sinus syndrome or Raynaud's disease -kidney disease -liver disease -lung or breathing disease, like asthma or emphysema -pheochromocytoma -thyroid disease -an unusual or allergic reaction to metoprolol, other beta-blockers, medicines, foods, dyes, or preservatives -pregnant or trying  to get pregnant -breast-feeding How should I use this medicine? Take this medicine by mouth with a glass of water. Follow the directions on the prescription label. Do not crush or chew. Take this medicine with or immediately after meals. Take your doses at regular intervals. Do not take more  medicine than directed. Do not stop taking this medicine suddenly. This could lead to serious heart-related effects. Talk to your pediatrician regarding the use of this medicine in children. While this drug may be prescribed for children as young as 6 years for selected conditions, precautions do apply. Overdosage: If you think you have taken too much of this medicine contact a poison control center or emergency room at once. NOTE: This medicine is only for you. Do not share this medicine with others. What if I miss a dose? If you miss a dose, take it as soon as you can. If it is almost time for your next dose, take only that dose. Do not take double or extra doses. What may interact with this medicine? This medicine may interact with the following medications: -certain medicines for blood pressure, heart disease, irregular heart beat -certain medicines for depression, like monoamine oxidase (MAO) inhibitors, fluoxetine, or paroxetine -clonidine -dobutamine -epinephrine -isoproterenol -reserpine This list may not describe all possible interactions. Give your health care provider a list of all the medicines, herbs, non-prescription drugs, or dietary supplements you use. Also tell them if you smoke, drink alcohol, or use illegal drugs. Some items may interact with your medicine. What should I watch for while using this medicine? Visit your doctor or health care professional for regular check ups. Contact your doctor right away if your symptoms worsen. Check your blood pressure and pulse rate regularly. Ask your health care professional what your blood pressure and pulse rate should be, and when you should contact them. You may get drowsy or dizzy. Do not drive, use machinery, or do anything that needs mental alertness until you know how this medicine affects you. Do not sit or stand up quickly, especially if you are an older patient. This reduces the risk of dizzy or fainting spells. Contact your  doctor if these symptoms continue. Alcohol may interfere with the effect of this medicine. Avoid alcoholic drinks. What side effects may I notice from receiving this medicine? Side effects that you should report to your doctor or health care professional as soon as possible: -allergic reactions like skin rash, itching or hives -cold or numb hands or feet -depression -difficulty breathing -faint -fever with sore throat -irregular heartbeat, chest pain -rapid weight gain -swollen legs or ankles Side effects that usually do not require medical attention (report to your doctor or health care professional if they continue or are bothersome): -anxiety or nervousness -change in sex drive or performance -dry skin -headache -nightmares or trouble sleeping -short term memory loss -stomach upset or diarrhea -unusually tired This list may not describe all possible side effects. Call your doctor for medical advice about side effects. You may report side effects to FDA at 1-800-FDA-1088. Where should I keep my medicine? Keep out of the reach of children. Store at room temperature between 15 and 30 degrees C (59 and 86 degrees F). Throw away any unused medicine after the expiration date. NOTE: This sheet is a summary. It may not cover all possible information. If you have questions about this medicine, talk to your doctor, pharmacist, or health care provider.  2018 Elsevier/Gold Standard (2013-07-20 14:41:37) Aspirin and Your Heart Aspirin is  a medicine that affects the way blood clots. Aspirin can be used to help reduce the risk of blood clots, heart attacks, and other heart-related problems. Should I take aspirin? Your health care provider will help you determine whether it is safe and beneficial for you to take aspirin daily. Taking aspirin daily may be beneficial if you:  Have had a heart attack or chest pain.  Have undergone open heart surgery such as coronary artery bypass surgery  (CABG).  Have had coronary angioplasty.  Have experienced a stroke or transient ischemic attack (TIA).  Have peripheral vascular disease (PVD).  Have chronic heart rhythm problems such as atrial fibrillation.  Are there any risks of taking aspirin daily? Daily use of aspirin can increase your risk of side effects. Some of these include:  Bleeding. Bleeding problems can be minor or serious. An example of a minor problem is a cut that does not stop bleeding. An example of a more serious problem is stomach bleeding or bleeding into the brain. Your risk of bleeding is increased if you are also taking non-steroidal anti-inflammatory medicine (NSAIDs).  Increased bruising.  Upset stomach.  An allergic reaction. People who have nasal polyps have an increased risk of developing an aspirin allergy.  What are some guidelines I should follow when taking aspirin?  Take aspirin only as directed by your health care provider. Make sure you understand how much you should take and what form you should take. The two forms of aspirin are: ? Non-enteric-coated. This type of aspirin does not have a coating and is absorbed quickly. Non-enteric-coated aspirin is usually recommended for people with chest pain. This type of aspirin also comes in a chewable form. ? Enteric-coated. This type of aspirin has a special coating that releases the medicine very slowly. Enteric-coated aspirin causes less stomach upset than non-enteric-coated aspirin. This type of aspirin should not be chewed or crushed.  Drink alcohol in moderation. Drinking alcohol increases your risk of bleeding. When should I seek medical care?  You have unusual bleeding or bruising.  You have stomach pain.  You have an allergic reaction. Symptoms of an allergic reaction include: ? Hives. ? Itchy skin. ? Swelling of the lips, tongue, or face.  You have ringing in your ears. When should I seek immediate medical care?  Your bowel movements  are bloody, dark red, or black in color.  You vomit or cough up blood.  You have blood in your urine.  You cough, wheeze, or feel short of breath. If you have any of the following symptoms, this is an emergency. Do not wait to see if the pain will go away. Get medical help at once. Call your local emergency services (911 in the [place] Do not drive yourself to the hospital.  You have severe chest pain, especially if the pain is crushing or pressure-like and spreads to the arms, back, neck, or jaw.  You have stroke-like symptoms, such as: ? Loss of vision. ? Difficulty talking. ? Numbness or weakness on one side of your body. ? Numbness or weakness in your arm or leg. ? Not thinking clearly or feeling confused.  This information is not intended to replace advice given to you by your health care provider. Make sure you discuss any questions you have with your health care provider. Document Released: 10/28/2008 Document Revised: 03/24/2016 Document Reviewed: 02/20/2014 Elsevier Interactive Patient Education  2018 [employer]. Amoxicillin; Clavulanic Acid tablets What is this medicine? AMOXICILLIN; CLAVULANIC ACID (a mox i SIL  in; KLAV yoo lan ic AS id) is a penicillin antibiotic. It is used to treat certain kinds of bacterial infections. It will not work for colds, flu, or other viral infections. This medicine may be used for other purposes; ask your health care provider or pharmacist if you have questions. COMMON BRAND NAME(S): Augmentin What should I tell my health care provider before I take this medicine? They need to know if you have any of these conditions: -bowel disease, like colitis -kidney disease -liver disease -mononucleosis -an unusual or allergic reaction to amoxicillin, penicillin, cephalosporin, other antibiotics, clavulanic acid, other medicines, foods, dyes, or preservatives -pregnant or trying to get pregnant -breast-feeding How should I use this medicine? Take  this medicine by mouth with a full glass of water. Follow the directions on the prescription label. Take at the start of a meal. Do not crush or chew. If the tablet has a score line, you may cut it in half at the score line for easier swallowing. Take your medicine at regular intervals. Do not take your medicine more often than directed. Take all of your medicine as directed even if you think you are better. Do not skip doses or stop your medicine early. Talk to your pediatrician regarding the use of this medicine in children. Special care may be needed. Overdosage: If you think you have taken too much of this medicine contact a poison control center or emergency room at once. NOTE: This medicine is only for you. Do not share this medicine with others. What if I miss a dose? If you miss a dose, take it as soon as you can. If it is almost time for your next dose, take only that dose. Do not take double or extra doses. What may interact with this medicine? -allopurinol -anticoagulants -birth control pills -methotrexate -probenecid This list may not describe all possible interactions. Give your health care provider a list of all the medicines, herbs, non-prescription drugs, or dietary supplements you use. Also tell them if you smoke, drink alcohol, or use illegal drugs. Some items may interact with your medicine. What should I watch for while using this medicine? Tell your doctor or health care professional if your symptoms do not improve. Do not treat diarrhea with over the counter products. Contact your doctor if you have diarrhea that lasts more than 2 days or if it is severe and watery. If you have diabetes, you may get a false-positive result for sugar in your urine. Check with your doctor or health care professional. Birth control pills may not work properly while you are taking this medicine. Talk to your doctor about using an extra method of birth control. What side effects may I notice from  receiving this medicine? Side effects that you should report to your doctor or health care professional as soon as possible: -allergic reactions like skin rash, itching or hives, swelling of the face, lips, or tongue -breathing problems -dark urine -fever or chills, sore throat -redness, blistering, peeling or loosening of the skin, including inside the mouth -seizures -trouble passing urine or change in the amount of urine -unusual bleeding, bruising -unusually weak or tired -white patches or sores in the mouth or throat Side effects that usually do not require medical attention (report to your doctor or health care professional if they continue or are bothersome): -diarrhea -dizziness -headache -nausea, vomiting -stomach upset -vaginal or anal irritation This list may not describe all possible side effects. Call your doctor for medical advice about side  effects. You may report side effects to FDA at 1-800-FDA-1088. Where should I keep my medicine? Keep out of the reach of children. Store at room temperature below 25 degrees C (77 degrees F). Keep container tightly closed. Throw away any unused medicine after the expiration date. NOTE: This sheet is a summary. It may not cover all possible information. If you have questions about this medicine, talk to your doctor, pharmacist, or health care provider.  2018 Elsevier/Gold Standard (2008-02-08 12:04:30) Albuterol inhalation aerosol What is this medicine? ALBUTEROL (al Gaspar Bidding) is a bronchodilator. It helps open up the airways in your lungs to make it easier to breathe. This medicine is used to treat and to prevent bronchospasm. This medicine may be used for other purposes; ask your health care provider or pharmacist if you have questions. COMMON BRAND NAME(S): Proair HFA, Proventil, Proventil HFA, Respirol, Ventolin, Ventolin HFA What should I tell my health care provider before I take this medicine? They need to know if you have  any of the following conditions: -diabetes -heart disease or irregular heartbeat -high blood pressure -pheochromocytoma -seizures -thyroid disease -an unusual or allergic reaction to albuterol, levalbuterol, sulfites, other medicines, foods, dyes, or preservatives -pregnant or trying to get pregnant -breast-feeding How should I use this medicine? This medicine is for inhalation through the mouth. Follow the directions on your prescription label. Take your medicine at regular intervals. Do not use more often than directed. Make sure that you are using your inhaler correctly. Ask you doctor or health care provider if you have any questions. Talk to your pediatrician regarding the use of this medicine in children. Special care may be needed. Overdosage: If you think you have taken too much of this medicine contact a poison control center or emergency room at once. NOTE: This medicine is only for you. Do not share this medicine with others. What if I miss a dose? If you miss a dose, use it as soon as you can. If it is almost time for your next dose, use only that dose. Do not use double or extra doses. What may interact with this medicine? -anti-infectives like chloroquine and pentamidine -caffeine -cisapride -diuretics -medicines for colds -medicines for depression or for emotional or psychotic conditions -medicines for weight loss including some herbal products -methadone -some antibiotics like clarithromycin, erythromycin, levofloxacin, and linezolid -some heart medicines -steroid hormones like dexamethasone, cortisone, hydrocortisone -theophylline -thyroid hormones This list may not describe all possible interactions. Give your health care provider a list of all the medicines, herbs, non-prescription drugs, or dietary supplements you use. Also tell them if you smoke, drink alcohol, or use illegal drugs. Some items may interact with your medicine. What should I watch for while using  this medicine? Tell your doctor or health care professional if your symptoms do not improve. Do not use extra albuterol. If your asthma or bronchitis gets worse while you are using this medicine, call your doctor right away. If your mouth gets dry try chewing sugarless gum or sucking hard candy. Drink water as directed. What side effects may I notice from receiving this medicine? Side effects that you should report to your doctor or health care professional as soon as possible: -allergic reactions like skin rash, itching or hives, swelling of the face, lips, or tongue -breathing problems -chest pain -feeling faint or lightheaded, falls -high blood pressure -irregular heartbeat -fever -muscle cramps or weakness -pain, tingling, numbness in the hands or feet -vomiting Side effects that usually do not  require medical attention (report to your doctor or health care professional if they continue or are bothersome): -cough -difficulty sleeping -headache -nervousness or trembling -stomach upset -stuffy or runny nose -throat irritation -unusual taste This list may not describe all possible side effects. Call your doctor for medical advice about side effects. You may report side effects to FDA at 1-800-FDA-1088. Where should I keep my medicine? Keep out of the reach of children. Store at room temperature between 15 and 30 degrees C (59 and 86 degrees F). The contents are under pressure and may burst when exposed to heat or flame. Do not freeze. This medicine does not work as well if it is too cold. Throw away any unused medicine after the expiration date. Inhalers need to be thrown away after the labeled number of puffs have been used or by the expiration date; whichever comes first. Ventolin HFA should be thrown away 12 months after removing from foil pouch. Check the instructions that come with your medicine. NOTE: This sheet is a summary. It may not cover all possible information. If you have  questions about this medicine, talk to your doctor, pharmacist, or health care provider.  2018 Elsevier/Gold Standard (2013-05-03 10:57:17) Hypertension Hypertension is another name for high blood pressure. High blood pressure forces your heart to work harder to pump blood. This can cause problems over time. There are two numbers in a blood pressure reading. There is a top number (systolic) over a bottom number (diastolic). It is best to have a blood pressure below 120/80. Healthy choices can help lower your blood pressure. You may need medicine to help lower your blood pressure if:  Your blood pressure cannot be lowered with healthy choices.  Your blood pressure is higher than 130/80.  Follow these instructions at home: Eating and drinking  If directed, follow the DASH eating plan. This diet includes: ? Filling half of your plate at each meal with fruits and vegetables. ? Filling one quarter of your plate at each meal with whole grains. Whole grains include whole wheat pasta, brown rice, and whole grain bread. ? Eating or drinking low-fat dairy products, such as skim milk or low-fat yogurt. ? Filling one quarter of your plate at each meal with low-fat (lean) proteins. Low-fat proteins include fish, skinless chicken, eggs, beans, and tofu. ? Avoiding fatty meat, cured and processed meat, or chicken with skin. ? Avoiding premade or processed food.  Eat less than 1,500 mg of salt (sodium) a day.  Limit alcohol use to no more than 1 drink a day for nonpregnant women and 2 drinks a day for men. One drink equals 12 oz of beer, 5 oz of wine, or 1 oz of hard liquor. Lifestyle  Work with your doctor to stay at a healthy weight or to lose weight. Ask your doctor what the best weight is for you.  Get at least 30 minutes of exercise that causes your heart to beat faster (aerobic exercise) most days of the week. This may include walking, swimming, or biking.  Get at least 30 minutes of exercise  that strengthens your muscles (resistance exercise) at least 3 days a week. This may include lifting weights or pilates.  Do not use any products that contain nicotine or tobacco. This includes cigarettes and e-cigarettes. If you need help quitting, ask your doctor.  Check your blood pressure at home as told by your doctor.  Keep all follow-up visits as told by your doctor. This is important. Medicines  Take  over-the-counter and prescription medicines only as told by your doctor. Follow directions carefully.  Do not skip doses of blood pressure medicine. The medicine does not work as well if you skip doses. Skipping doses also puts you at risk for problems.  Ask your doctor about side effects or reactions to medicines that you should watch for. Contact a doctor if:  You think you are having a reaction to the medicine you are taking.  You have headaches that keep coming back (recurring).  You feel dizzy.  You have swelling in your ankles.  You have trouble with your vision. Get help right away if:  You get a very bad headache.  You start to feel confused.  You feel weak or numb.  You feel faint.  You get very bad pain in your: ? Chest. ? Belly (abdomen).  You throw up (vomit) more than once.  You have trouble breathing. Summary  Hypertension is another name for high blood pressure.  Making healthy choices can help lower blood pressure. If your blood pressure cannot be controlled with healthy choices, you may need to take medicine. This information is not intended to replace advice given to you by your health care provider. Make sure you discuss any questions you have with your health care provider. Document Released: 05/03/2008 Document Revised: 10/13/2016 Document Reviewed: 10/13/2016 Elsevier Interactive Patient Education  2018 [employer].  Sinusitis, Adult Sinusitis is soreness and inflammation of your sinuses. Sinuses are hollow spaces in the bones around  your face. They are located:  Around your eyes.  In the middle of your forehead.  Behind your nose.  In your cheekbones.  Your sinuses and nasal passages are lined with a stringy fluid (mucus). Mucus normally drains out of your sinuses. When your nasal tissues get inflamed or swollen, the mucus can get trapped or blocked so air cannot flow through your sinuses. This lets bacteria, viruses, and funguses grow, and that leads to infection. Follow these instructions at home: Medicines  Take, use, or apply over-the-counter and prescription medicines only as told by your doctor. These may include nasal sprays.  If you were prescribed an antibiotic medicine, take it as told by your doctor. Do not stop taking the antibiotic even if you start to feel better. Hydrate and Humidify  Drink enough water to keep your pee (urine) clear or pale yellow.  Use a cool mist humidifier to keep the humidity level in your home above 50%.  Breathe in steam for 10-15 minutes, 3-4 times a day or as told by your doctor. You can do this in the bathroom while a hot shower is running.  Try not to spend time in cool or dry air. Rest  Rest as much as possible.  Sleep with your head raised (elevated).  Make sure to get enough sleep each night. General instructions  Put a warm, moist washcloth on your face 3-4 times a day or as told by your doctor. This will help with discomfort.  Wash your hands often with soap and water. If there is no soap and water, use hand sanitizer.  Do not smoke. Avoid being around people who are smoking (secondhand smoke).  Keep all follow-up visits as told by your doctor. This is important. Contact a doctor if:  You have a fever.  Your symptoms get worse.  Your symptoms do not get better within 10 days. Get help right away if:  You have a very bad headache.  You cannot stop throwing up (vomiting).  You have pain or swelling around your face or eyes.  You have trouble  seeing.  You feel confused.  Your neck is stiff.  You have trouble breathing. This information is not intended to replace advice given to you by your health care provider. Make sure you discuss any questions you have with your health care provider. Document Released: 05/03/2008 Document Revised: 07/11/2016 Document Reviewed: 09/10/2015 Elsevier Interactive Patient Education  [employer].

## 2017-11-26 NOTE — Progress Notes (Signed)
Progress Note  Patient Name: Victoria Avery Date of Encounter: 11/26/2017  Primary Cardiologist: New to [facility]  Subjective   Continued sinus pressure, postnasal drip, coughing "Ribs are sore" from coughing Blood pressure trending 150 up to 160 systolic No other complaints Echocardiogram pending ambulating without significant shortness of breath or chest discomfort   Inpatient Medications    Scheduled Meds: . amoxicillin-clavulanate  1 tablet Oral BID  . aspirin EC  81 mg Oral Daily  . enoxaparin (LOVENOX) injection  40 mg Subcutaneous Q24H  . lisinopril  10 mg Oral BID  . loratadine  10 mg Oral Daily  . metoprolol tartrate  50 mg Oral BID  . nicotine  14 mg Transdermal Daily   Continuous Infusions:  PRN Meds: acetaminophen **OR** acetaminophen, hydrALAZINE, ibuprofen, morphine injection, nitroGLYCERIN, ondansetron **OR** ondansetron (ZOFRAN) IV, polyethylene glycol, sodium chloride   Vital Signs    Vitals:   11/25/17 1929 11/26/17 0402 11/26/17 0406 11/26/17 0800  BP: (!) 157/82 (!) 169/99 (!) 150/96 (!) 155/96  Pulse: 92 87 85 87  Resp: 17 18  17   Temp: 99.1 F (37.3 C) 98.6 F (37 C)  98.7 F (37.1 C)  TempSrc: Oral Oral  Oral  SpO2: 94% 96%  96%  Weight:      Height:        Intake/Output Summary (Last 24 hours) at 11/26/2017 1243 Last data filed at 11/26/2017 1029 Gross per 24 hour  Intake 0 ml  Output 400 ml  Net -400 ml   Filed Weights   11/25/17 1255  Weight: 180 lb (81.6 kg)    Telemetry    Normal sinus rhythm- Personally Reviewed  ECG      Physical Exam   GEN: No acute distress.   Neck: No JVD Cardiac: RRR, no murmurs, rubs, or gallops.  Respiratory: Clear to auscultation bilaterally. GI: Soft, nontender, non-distended  MS: No edema; No deformity. Neuro:  Nonfocal  Psych: Normal affect   Labs    Chemistry Recent Labs  Lab 11/25/17 1528  NA 135  K 4.0  CL 100*  CO2 26  GLUCOSE 123*  BUN 10  CREATININE 0.74    CALCIUM 9.2  PROT 7.6  ALBUMIN 4.5  AST 31  ALT 24  ALKPHOS 86  BILITOT 0.8  GFRNONAA >60  GFRAA >60  ANIONGAP 9     Hematology Recent Labs  Lab 11/25/17 1528  WBC 2.9*  RBC 5.20  HGB 16.7*  HCT 49.7*  MCV 95.6  MCH 32.0  MCHC 33.5  RDW 13.2  PLT 111*    Cardiac Enzymes Recent Labs  Lab 11/25/17 1528 11/25/17 1815 11/26/17 0006 11/26/17 0555  TROPONINI 0.07* 0.07* 0.06* 0.06*   No results for input(s): TROPIPOC in the last 168 hours.   BNPNo results for input(s): BNP, PROBNP in the last 168 hours.   DDimer No results for input(s): DDIMER in the last 168 hours.   Radiology    No results found.  Cardiac Studies   Echocardiogram pending  Patient Profile     Victoria Avery is a 51 y.o. female with a hx  Of smoking, who continues to smoke 1 pack/day Presenting with sinus pressure, congestion, pain in her face, Referred from urgent care for markedly elevated blood pressure, elevated troponin  Assessment & Plan    1 acute hypertensive urgency.  Likely exacerbated by sinus pain and pressure, possible sinusitis Received labetalol and hydralazine in the emergency room Started on metoprolol, lisinopril on the floor  Currently running 150 up to 160s systolic diastolic of 100 --- Consider changing  to metoprolol succinate 50 mg daily, hold the metoprolol tartrate --- Consider changing lisinopril 10 twice daily to lisinopril HCTZ 10/25 mg daily Outpatient monitoring with follow-up in clinic for further medication titration  2.  Elevated troponin  0.07 - 0.06 x4 Likely secondary to hypertension Risk factors include smoking asymptomatic,  Current findings do  not warrant further ischemic workup at this time including stress testing.  Echocardiogram can be done as inpatient or outpatient if unable to be performed in a timely manner  3.  Smoker We have encouraged her to continue to work on weaning her cigarettes and smoking cessation. She will continue  to work on this and does not want any assistance with chantix.  Discussed with her again today  4. Sinusitis Symptoms may warrant short course of antibiotics   Total encounter time more than 25 minutes  Greater than 50% was spent in counseling and coordination of care with the patient   For questions or updates, please contact [facility] Please consult [email or web address] for contact info under Cardiology/STEMI.      Signed, Julien Nordmannimothy Felice Deem, MD  11/26/2017, 12:43 PM

## 2017-11-26 NOTE — Discharge Summary (Signed)
Victoria Avery, is a 51 y.o. female  DOB [birth year]  MRN [id number].  Admission date:  11/25/2017  Admitting Physician  Bertrum SolMontell D Salary, MD  Discharge Date:  11/26/2017   Primary MD  Patient, No Pcp Per  Recommendations for primary care physician for things to follow:   Follow Up with Dr. Dossie Arbourim Gollan in 1 week  Admission Diagnosis  Elevated troponin I level [R74.8] Hypertension, unspecified type [I10]   Discharge Diagnosis  Elevated troponin I level [R74.8] Hypertension, unspecified type [I10]    Active Problems:   HTN (hypertension)      History reviewed. No pertinent past medical history.  Past Surgical History:  Procedure Laterality Date  . CESAREAN SECTION         History of present illness and  Hospital Course:     Kindly see H&P for history of present illness and admission details, please review complete Labs, Consult reports and Test reports for all details in brief  HPI  from the history and physical done on the day of admission 51 year old female patient with history of tobacco abuse, continues to smoke 1 pack of cigarettes a day came in because of sinus pressure, congestion, cough, referred from urgent care because of markedly elevated blood pressure.  Pressure was 220/110 in the emergency room with sinus tachycardia with heart rate 120.  Hospital Course   #1. malignant hypertension: Admitted to telemetry, received IV labetalol, hydralazine, started on metoprolol tartrate 50 mg twice daily, lisinopril 20 mg daily, troponins trended down.  Patient troponins stayed around 0.07, 0.06 without any EKG changes.  Cardiology from [facility] Dr. Mariah MillingGollan, saw the patient, commended Toprol-XL 50 mg daily, h lisinopril HCTZ combination 20/25.  Patient blood pressure improved today and it is around 155/96.   And Dr.: Recommended that patient can be discharged and follow-up with him as an outpatient for echocardiogram and blood pressure checks.  Acute sinusitis, acute bronchitis: Added Augmentin, patient also has slight wheezing with her king history added albuterol also.  Advised her to quit smoking.   #3 .hyperlipidemia: LDL 126.  Will add small dose statins and have follow-up with Dr. Dossie Arbourim Gollan as an outpatient. Impaired thyroid function with elevated TSH up to 5.5.  Needs repeat TSH levels in Dr. Windell HummingbirdGollan's office just to make sure if she needs she needs to be on Synthyroid.  Discharge Condition: full  Follow UP  Follow-up Information    Antonieta IbaGollan, Timothy J, MD Follow up in 1 week(s).   Specialty:  Cardiology Contact information: [address] [place] [address] [phone]             Discharge Instructions  and  Discharge Medications      Allergies as of 11/26/2017   No Known Allergies     Medication List    TAKE these medications   albuterol 108 (90 Base) MCG/ACT inhaler Commonly known as:  PROVENTIL HFA;VENTOLIN HFA Inhale 2 puffs into the lungs every 6 (six) hours as needed for wheezing or shortness of breath.   amoxicillin-clavulanate 875-125 MG tablet Commonly known as:  AUGMENTIN Take 1 tablet by mouth 2 (two) times daily.   aspirin 81 MG EC tablet Take 1 tablet (81 mg total) by mouth daily. Start taking on:  11/27/2017   lisinopril-hydrochlorothiazide 20-12.5 MG tablet Commonly known as:  ZESTORETIC Take 1 tablet by mouth daily.   metoprolol succinate 50 MG 24 hr tablet Commonly known as:  TOPROL XL Take 1 tablet (50  mg total) by mouth daily. Take with or immediately following a meal.         Diet and Activity recommendation: See Discharge Instructions above   Consults obtained - cardiology   Major procedures and Radiology Reports - PLEASE review detailed and final reports for all details, in brief -     No results  found.  Micro Results    No results found for this or any previous visit (from the past 240 hour(s)).     Today   Subjective:   Victoria Avery today has no headache,no chest abdominal pain,no new weakness tingling or numbness, feels much better wants to go home today.  Objective:   Blood pressure (!) 155/96, pulse 87, temperature 98.7 F (37.1 C), temperature source Oral, resp. rate 17, height 5\' 3"  (1.6 m), weight 81.6 kg (180 lb), last menstrual period 11/25/2017, SpO2 96 %.   Intake/Output Summary (Last 24 hours) at 11/26/2017 1325 Last data filed at 11/26/2017 1029 Gross per 24 hour  Intake 0 ml  Output 400 ml  Net -400 ml    Exam Awake Alert, Oriented x 3, No new F.N deficits, Normal affect [place].AT,PERRAL Supple Neck,No JVD, No cervical lymphadenopathy appriciated.  Symmetrical Chest wall movement, Good air movement bilaterally, CTAB RRR,No Gallops,Rubs or new Murmurs, No Parasternal Heave +ve B.Sounds, Abd Soft, Non tender, No organomegaly appriciated, No rebound -guarding or rigidity. No Cyanosis, Clubbing or edema, No new Rash or bruise  Data Review   CBC w Diff:  Lab Results  Component Value Date   WBC 2.9 (L) 11/25/2017   HGB 16.7 (H) 11/25/2017   HCT 49.7 (H) 11/25/2017   PLT 111 (L) 11/25/2017   LYMPHOPCT 22 11/25/2017   MONOPCT 7 11/25/2017   EOSPCT 1 11/25/2017   BASOPCT 1 11/25/2017    CMP:  Lab Results  Component Value Date   NA 135 11/25/2017   K 4.0 11/25/2017   CL 100 (L) 11/25/2017   CO2 26 11/25/2017   BUN 10 11/25/2017   CREATININE 0.74 11/25/2017   PROT 7.6 11/25/2017   ALBUMIN 4.5 11/25/2017   BILITOT 0.8 11/25/2017   ALKPHOS 86 11/25/2017   AST 31 11/25/2017   ALT 24 11/25/2017  .   Total Time in preparing paper work, data evaluation and todays exam - 35 minutes  Katha HammingSnehalatha Kely Dohn M.D on 11/26/2017 at 1:25 PM    Note: This dictation was prepared with Dragon dictation along with smaller phrase technology. Any  transcriptional errors that result from this process are unintentional.

## 2017-11-30 ENCOUNTER — Telehealth: Payer: Self-pay | Admitting: *Deleted

## 2017-11-30 NOTE — Telephone Encounter (Signed)
-----   Message from Coralee RudSabrina F Gilley sent at 11/30/2017 10:52 AM EST ----- Regarding: tcm/ph 1/14 4:00 Dr. Mariah MillingGollan

## 2017-11-30 NOTE — Telephone Encounter (Signed)
Patient contacted regarding [facility] on 11/26/17.   Patient understands to follow up with provider ? [facility]  Patient understands discharge instructions? Yes   Patient understands medications and regiment? Yes  Patient understands to bring all medications to this visit? Yes

## 2017-12-10 DIAGNOSIS — E785 Hyperlipidemia, unspecified: Secondary | ICD-10-CM | POA: Insufficient documentation

## 2017-12-10 DIAGNOSIS — R7989 Other specified abnormal findings of blood chemistry: Secondary | ICD-10-CM | POA: Insufficient documentation

## 2017-12-10 DIAGNOSIS — F17200 Nicotine dependence, unspecified, uncomplicated: Secondary | ICD-10-CM | POA: Insufficient documentation

## 2017-12-10 DIAGNOSIS — J329 Chronic sinusitis, unspecified: Secondary | ICD-10-CM | POA: Insufficient documentation

## 2017-12-10 DIAGNOSIS — I160 Hypertensive urgency: Secondary | ICD-10-CM | POA: Insufficient documentation

## 2017-12-10 DIAGNOSIS — R778 Other specified abnormalities of plasma proteins: Secondary | ICD-10-CM | POA: Insufficient documentation

## 2017-12-10 NOTE — Progress Notes (Signed)
Cardiology Office Note  Date:  12/12/2017   ID:  Victoria Avery, DOB 17-Mar-1966, MRN [id number]  PCP:  Patient, No Pcp Per   Chief Complaint  Patient presents with  . other    Follow up from [facility]; HTN. Meds reviewed by the pt. verbally. Denies chest pain nor shortness of breath.     HPI:  Victoria Avery is a 52 y.o. female with a hx  Of malignant hypertension Of smoking, who continues to smoke 1 pack/day Presenting to the hospital December 2018 with sinus pressure, congestion, pain in her face, Referred from urgent care for markedly elevated blood pressure, elevated troponin Who presents to the outpatient clinic after recent hospital discharge for follow-up of her hypertension  Hospital records reviewed with the patient in detail acute hypertensive urgency in the hospital  exacerbated by sinus pain and pressure, possible sinusitis Received labetalol and hydralazine in the emergency room Started on metoprolol, lisinopril on the floor  At discharge she was changed to metoprolol succinate 50 mg daily,  lisinopril HCTZ 20/12.5 mg daily  Minimally elevated troponin  0.07 - 0.06 x4 Likely secondary to hypertension Risk factors include smoking asymptomatic,   Smoker We have encouraged her to continue to work on weaning her cigarettes and smoking cessation. She will continue to work on this and does not want any assistance with chantix.  Discussed with her again today  Sinusitis Received antibiotics  Blood pressure at home ranging 116 up to 150s systolic  EKG personally reviewed by myself on todays visit Shows normal sinus rhythm rate 100 bpm nonspecific T wave abnormality V5, V6 1 and aVL  Echocardiogram results reviewed with her from the hospital showing ejection fraction 45-50%    PMH:   has a past medical history of Hypertension.  PSH:    Past Surgical History:  Procedure Laterality Date  . CESAREAN SECTION      Current Outpatient Medications   Medication Sig Dispense Refill  . albuterol (PROVENTIL HFA;VENTOLIN HFA) 108 (90 Base) MCG/ACT inhaler Inhale 2 puffs into the lungs every 6 (six) hours as needed for wheezing or shortness of breath. 1 Inhaler 2  . aspirin EC 81 MG EC tablet Take 1 tablet (81 mg total) by mouth daily. 30 tablet 0  . lisinopril-hydrochlorothiazide (ZESTORETIC) 20-12.5 MG tablet Take 1 tablet by mouth daily. 30 tablet 11  . metoprolol succinate (TOPROL XL) 50 MG 24 hr tablet Take 1 tablet (50 mg total) by mouth daily. Take with or immediately following a meal. 30 tablet 0   No current facility-administered medications for this visit.      Allergies:   Patient has no known allergies.   Social History:  The patient  reports that she has been smoking cigarettes.  She has a 30.00 pack-year smoking history. she has never used smokeless tobacco. She reports that she drinks alcohol. She reports that she does not use drugs.   Family History:   family history is not on file.    Review of Systems: Review of Systems  Constitutional: Negative.   Respiratory: Negative.   Cardiovascular: Negative.   Gastrointestinal: Negative.   Musculoskeletal: Negative.   Neurological: Negative.   Psychiatric/Behavioral: Negative.   All other systems reviewed and are negative.    PHYSICAL EXAM: VS:  BP (!) 152/104 (BP Location: Left Arm, Patient Position: Sitting, Cuff Size: Normal)   Pulse 100   Ht 5\' 3"  (1.6 m)   Wt 178 lb 12 oz (81.1 kg)   LMP 11/25/2017  BMI 31.66 kg/m  , BMI Body mass index is 31.66 kg/m. GEN: Well nourished, well developed, in no acute distress  HEENT: normal  Neck: no JVD, carotid bruits, or masses Cardiac: RRR; no murmurs, rubs, or gallops,no edema  Respiratory:  clear to auscultation bilaterally, normal work of breathing GI: soft, nontender, nondistended, + BS MS: no deformity or atrophy  Skin: warm and dry, no rash Neuro:  Strength and sensation are intact Psych: euthymic mood, full  affect    Recent Labs: 11/25/2017: ALT 24; BUN 10; Creatinine, Ser 0.74; Hemoglobin 16.7; Platelets 111; Potassium 4.0; Sodium 135; TSH 5.503    Lipid Panel Lab Results  Component Value Date   CHOL 200 11/26/2017   HDL 30 (L) 11/26/2017   LDLCALC 126 (H) 11/26/2017   TRIG 219 (H) 11/26/2017      Wt Readings from Last 3 Encounters:  12/12/17 178 lb 12 oz (81.1 kg)  11/25/17 180 lb (81.6 kg)       ASSESSMENT AND PLAN:  Hypertensive urgency - Plan: EKG 12-Lead Reasonable blood pressureReasonable blood pressure 116 up to 150 systolic 116 up to 150 systolic.  Recommend she continue to take current medications, monitor numbersRecommend she continue to take current medications, monitor numbers and if blood pressure runs high and blood pressure runs high we would increase lisinopril HCTZ up to up to 40 mg / 25 mg daily 40 mg/25 mill grams daily we would increase lisinopril HCTZ  Smoker We have encouraged her to continue to work on weaning her cigarettes and smoking cessation. She will continue to work on this and does not want any assistance with chantix.   Elevated troponin - Plan: EKG 12-Lead Minimal elevated troponin non trending in the setting of hypertension  Ejection fraction mildly depressed 45-50% She does not want ischemic workup at this time  Mixed hyperlipidemia She has declined cholesterol medication  Acute non-recurrent frontal sinusitis If symptoms persist she will contact ear nose throat Still with minimal postnasal drip, symptoms have improved  Disposition:   F/U  6 months   Total encounter time more than 45 minutes  Greater than 50% was spent in counseling and coordination of care with the patient    Orders Placed This Encounter  Procedures  . EKG 12-Lead     Signed, Dossie Arbourim Deamber Buckhalter, M.D., Ph.D. 12/12/2017  [facility] [place] [phone]

## 2017-12-12 ENCOUNTER — Ambulatory Visit: Payer: BLUE CROSS/BLUE SHIELD | Admitting: Cardiovascular Disease

## 2017-12-12 ENCOUNTER — Encounter: Payer: Self-pay | Admitting: Cardiovascular Disease

## 2017-12-12 DIAGNOSIS — I160 Hypertensive urgency: Secondary | ICD-10-CM | POA: Diagnosis not present

## 2017-12-12 DIAGNOSIS — J0110 Acute frontal sinusitis, unspecified: Secondary | ICD-10-CM

## 2017-12-12 DIAGNOSIS — F17200 Nicotine dependence, unspecified, uncomplicated: Secondary | ICD-10-CM

## 2017-12-12 DIAGNOSIS — R7989 Other specified abnormal findings of blood chemistry: Secondary | ICD-10-CM

## 2017-12-12 DIAGNOSIS — R748 Abnormal levels of other serum enzymes: Secondary | ICD-10-CM

## 2017-12-12 DIAGNOSIS — E782 Mixed hyperlipidemia: Secondary | ICD-10-CM

## 2017-12-12 DIAGNOSIS — R778 Other specified abnormalities of plasma proteins: Secondary | ICD-10-CM

## 2017-12-12 MED ORDER — LISINOPRIL-HYDROCHLOROTHIAZIDE 20-12.5 MG PO TABS: 1.0000 | ORAL_TABLET | Freq: Every day | ORAL | 3 refills | Status: DC

## 2017-12-12 MED ORDER — METOPROLOL SUCCINATE ER 50 MG PO TB24: 50.0000 mg | ORAL_TABLET | Freq: Every day | ORAL | 3 refills | Status: DC

## 2017-12-12 NOTE — Patient Instructions (Signed)

## 2018-02-24 ENCOUNTER — Telehealth: Payer: Self-pay | Admitting: Cardiovascular Disease

## 2018-02-24 DIAGNOSIS — I10 Essential (primary) hypertension: Secondary | ICD-10-CM

## 2018-02-24 MED ORDER — LISINOPRIL-HYDROCHLOROTHIAZIDE 20-12.5 MG PO TABS: ORAL_TABLET | ORAL | 1 refills | Status: DC

## 2018-02-24 NOTE — Telephone Encounter (Signed)
Reviewed with Dr. Mariah MillingGollan- ok to refill lisinopril 20/12.5 mg - take 1 & 1/2 tablets (30/18.75 mg) by mouth once daily. The patient will need a repeat BMP.  I have notified the patient of the above. [facility] placed [facility]. RX refill [facility] to [facility] for [facility] supply. The patient voices understanding and is agreeable with the above.

## 2018-02-24 NOTE — Telephone Encounter (Signed)
Please call to discuss Lisinopril doseage 

## 2018-02-24 NOTE — Telephone Encounter (Signed)
I spoke with the patient.  She called today to report she has been taking lisinopril 20/12.5 mg- 1 & 1/2 tablets (30/18.75 mg) by mouth once daily for BP control since she saw Dr. Mariah MillingGollan on 12/12/17. BP's have been running about 140/80 on average.  She is currently out of her lisinopril/hctz and is requesting a refill.  She states she feels good on the 1 & 1/2 tablets (30/18.75 mg) dose that she has been taking. I advised her that per Dr. Windell HummingbirdGollan's note, she could increase lisinopril/hctz up to 40/25 mg once daily. She advises she would prefer to stay on the pill and half a day. I advised her I would review with Dr. Mariah MillingGollan as her pressures are still 140/80 on average. I will call her back with recommendations. RX needs to [facility] to [facility] on [address]

## 2018-09-09 ENCOUNTER — Other Ambulatory Visit: Payer: Self-pay | Admitting: Cardiovascular Disease

## 2018-12-09 ENCOUNTER — Telehealth: Payer: Self-pay | Admitting: Cardiovascular Disease

## 2018-12-10 ENCOUNTER — Other Ambulatory Visit: Payer: Self-pay | Admitting: Cardiovascular Disease

## 2018-12-11 NOTE — Telephone Encounter (Signed)
Patient never went and got blood work done from her increasing her medication to a pill and a half per phone note 01/2018. Please advise if it is okay to refill this or if we need to contact patient to get blood work done before hand.   Thanks!

## 2018-12-12 ENCOUNTER — Telehealth: Payer: Self-pay | Admitting: Cardiovascular Disease

## 2018-12-12 NOTE — Telephone Encounter (Signed)
LMOV to schedule follow up 

## 2018-12-12 NOTE — Telephone Encounter (Signed)
-----   Message from Margrett Rud, [place] sent at 12/11/2018 10:34 AM EST ----- Regarding: appointment for refills Patient needs an appointment for further refills. If patient does not want to schedule an appointment please make them aware to contact PCP for refills. I have sent in enough medication until appointment can be made.   Thanks Ladies!

## 2018-12-12 NOTE — Telephone Encounter (Signed)
Patient last saw Dr Mariah Milling 11/2017. She is overdue for follow up appointment.  Left message for patient to call back to discuss and arrange a follow up appointment.

## 2018-12-13 NOTE — Telephone Encounter (Signed)
Called patient and she is agreeable to scheduling follow up appointment. She will need in the afternoon as she works until 3 pm. She is agreeable to seeing Ryan on 01/03/19 at 3:30 pm. Patient then stated she had enough medication to get to that appointment. Refill refused.

## 2019-01-02 ENCOUNTER — Telehealth: Payer: Self-pay | Admitting: Cardiovascular Disease

## 2019-01-02 ENCOUNTER — Other Ambulatory Visit: Payer: Self-pay

## 2019-01-02 MED ORDER — LISINOPRIL-HYDROCHLOROTHIAZIDE 20-12.5 MG PO TABS: ORAL_TABLET | ORAL | 0 refills | Status: DC

## 2019-01-02 NOTE — Telephone Encounter (Signed)
°*  STAT* If patient is at the pharmacy, call can be transferred to refill team.   1. Which medications need to be refilled? (please list name of each medication and dose if known) Lisinopril Hctz 20-12.5 mg take 1.5 tabs po BID   2. Which pharmacy/location (including street and city if local pharmacy) is medication to be sent to? [facility] [address] [place] [place]    3. Do they need a 30 day or 90 day supply? 30

## 2019-01-02 NOTE — Telephone Encounter (Signed)
Requested Prescriptions   Signed Prescriptions Disp Refills  . lisinopril-hydrochlorothiazide (PRINZIDE,ZESTORETIC) 20-12.5 MG tablet 45 tablet 0    Sig: Take 1.5 tablets (30/18.75MG) by mouth once daily    Authorizing Provider: GOLLAN, TIMOTHY J    Ordering User: Kamarri Fischetti N    

## 2019-01-02 NOTE — Telephone Encounter (Signed)
Requested Prescriptions   Signed Prescriptions Disp Refills  . lisinopril-hydrochlorothiazide (PRINZIDE,ZESTORETIC) 20-12.5 MG tablet 45 tablet 0    Sig: Take 1.5 tablets (30/18.75MG ) by mouth once daily    Authorizing Provider: Antonieta IbaGOLLAN, TIMOTHY J    Ordering User: Margrett RudSLAYTON, Sheleen Conchas N

## 2019-01-03 ENCOUNTER — Ambulatory Visit: Payer: BLUE CROSS/BLUE SHIELD | Admitting: Physician Assistant

## 2019-01-30 ENCOUNTER — Encounter: Payer: Self-pay | Admitting: Nurse Practitioner

## 2019-01-30 ENCOUNTER — Ambulatory Visit (INDEPENDENT_AMBULATORY_CARE_PROVIDER_SITE_OTHER): Payer: BLUE CROSS/BLUE SHIELD | Admitting: Nurse Practitioner

## 2019-01-30 VITALS — BP 140/84 | HR 88 | Ht 63.0 in | Wt 185.0 lb

## 2019-01-30 DIAGNOSIS — I429 Cardiomyopathy, unspecified: Secondary | ICD-10-CM

## 2019-01-30 DIAGNOSIS — I10 Essential (primary) hypertension: Secondary | ICD-10-CM

## 2019-01-30 DIAGNOSIS — Z720 Tobacco use: Secondary | ICD-10-CM | POA: Diagnosis not present

## 2019-01-30 MED ORDER — LISINOPRIL-HYDROCHLOROTHIAZIDE 20-12.5 MG PO TABS: ORAL_TABLET | ORAL | 3 refills | Status: DC

## 2019-01-30 MED ORDER — METOPROLOL SUCCINATE ER 50 MG PO TB24: 50.0000 mg | ORAL_TABLET | Freq: Every day | ORAL | 3 refills | Status: DC

## 2019-01-30 NOTE — Patient Instructions (Signed)
Medication Instructions:  Your physician recommends that you continue on your current medications as directed. Please refer to the Current Medication list given to you today.  If you need a refill on your cardiac medications before your next appointment, please call your pharmacy.   Lab work: Your physician recommends that you return to clinic for lab work tomorrow @__________AM /PM. Labs included are; BMET. We will contact you with results in 1-2 business days.   If you have labs (blood work) drawn today and your tests are completely normal, you will receive your results only by: Marland Kitchen MyChart Message (if you have MyChart) OR . A paper copy in the mail If you have any lab test that is abnormal or we need to change your treatment, we will call you to review the results.  Testing/Procedures: None ordered   Follow-Up: At [facility], you and your health needs are our priority.  As part of our continuing mission to provide you with exceptional heart care, we have created designated Provider Care Teams.  These Care Teams include your primary Cardiologist (physician) and Advanced Practice Providers (APPs -  Physician Assistants and Nurse Practitioners) who all work together to provide you with the care you need, when you need it. You will need a follow up appointment in 1 years.  Please see Julien Nordmann, MD

## 2019-01-30 NOTE — Progress Notes (Signed)
Office Visit    Patient Name: Victoria Avery Date of Encounter: 01/30/2019  Primary Care Provider:  Patient, No Pcp Per Primary Cardiologist:  Julien Nordmann, MD  Chief Complaint    53 y/o ? w/ a h/o HTN, tob abuse, and mild LV dysfxn, who presents for f/u of HTN.  Past Medical History    Past Medical History:  Diagnosis Date  . Cardiomyopathy (HCC)    a. 10/2017 Echo: EF 45-50% Gr1 DD, mild MR. Nl LA size. Nl RV fxn and PASP.  Marland Kitchen Hypertension   . Tobacco abuse    Past Surgical History:  Procedure Laterality Date  . CESAREAN SECTION      Allergies  No Known Allergies  History of Present Illness    53 year old female with a history of hypertension, tobacco abuse, and mild LV dysfunction with prior admission in December 2018 in the setting of hypertensive urgency.  She was last seen in clinic in early 2019.  She wished to defer ischemic evaluation at that time..  Since her last visit, she reports doing well.  She walks regularly without chest pain or dyspnea.  She denies palpitations, PND, orthopnea, dizziness, syncope, edema, or early satiety.  Blood pressures at home typically run in the 120s to 130s and she is careful with her salt intake.  Home Medications    Prior to Admission medications   Medication Sig Start Date End Date Taking? Authorizing Provider  albuterol (PROVENTIL HFA;VENTOLIN HFA) 108 (90 Base) MCG/ACT inhaler Inhale 2 puffs into the lungs every 6 (six) hours as needed for wheezing or shortness of breath. 11/26/17  Yes Katha Hamming, MD  aspirin EC 81 MG EC tablet Take 1 tablet (81 mg total) by mouth daily. 11/27/17  Yes Katha Hamming, MD  lisinopril-hydrochlorothiazide (PRINZIDE,ZESTORETIC) 20-12.5 MG tablet Take 1.5 tablets (30/18.75MG ) by mouth once daily 01/02/19  Yes Gollan, Tollie Pizza, MD  metoprolol succinate (TOPROL-XL) 50 MG 24 hr tablet TAKE 1 TABLET (50 MG TOTAL) BY MOUTH DAILY. TAKE WITH OR IMMEDIATELY FOLLOWING A MEAL. 12/11/18   Yes Antonieta Iba, MD    Review of Systems    She denies chest pain, palpitations, dyspnea, pnd, orthopnea, n, v, dizziness, syncope, edema, weight gain, or early satiety.  All other systems reviewed and are otherwise negative except as noted above.  Physical Exam    VS:  BP 140/84 (BP Location: Left Arm, Patient Position: Sitting, Cuff Size: Normal)   Pulse 88   Ht 5\' 3"  (1.6 m)   Wt 185 lb (83.9 kg)   BMI 32.77 kg/m  , BMI Body mass index is 32.77 kg/m. GEN: Well nourished, well developed, in no acute distress. HEENT: normal. Neck: Supple, no JVD, carotid bruits, or masses. Cardiac: RRR, no murmurs, rubs, or gallops. No clubbing, cyanosis, edema.  Radials/DP/PT 2+ and equal bilaterally.  Respiratory:  Respirations regular and unlabored, clear to auscultation bilaterally. GI: Soft, nontender, nondistended, BS + x 4. MS: no deformity or atrophy. Skin: warm and dry, no rash. Neuro:  Strength and sensation are intact. Psych: Normal affect.  Accessory Clinical Findings    ECG personally reviewed by me today -regular sinus rhythm, 88, left atrial enlargement- no acute changes.  Assessment & Plan    1.  Essential hypertension: Blood pressure mildly elevated today at 140/84.  She is due for refills on her lisinopril HCTZ and metoprolol.  She says blood pressures at home typically run in the 120s to 130s.  I will follow-up a basic  metabolic panel today and send in refills for current medications.  I have asked her to continue to follow her blood pressures closely at home and contact us if she is consistently greater than 130, at which point we can titrate her lisinopril HCTZ further.  2.  Tobacco abuse: Still smoking a pack a half a day.  She says that she is not ready to consider quitting.  I did recommend complete cessation and offered her any pharmacologic assistance she might require in helping her to quit.  She will contact us in the future if she reconsiders.  3.   Cardiomyopathy: Echocardiogram performed in December 2018 in the setting of hypertensive urgency showed an EF of 45 to 50%.  She is not interested in ischemic evaluation.  She has not had any chest pain or dyspnea.  She remains on beta-blocker and ACE inhibitor therapy.  4.  Disposition: Follow-up basic metabolic panel today.  Provided that this is stable, follow-up in clinic in 1 year or sooner if necessary.   Nicolasa Ducking, NP 01/30/2019, 5:03 PM

## 2019-01-31 ENCOUNTER — Other Ambulatory Visit (INDEPENDENT_AMBULATORY_CARE_PROVIDER_SITE_OTHER): Payer: BLUE CROSS/BLUE SHIELD

## 2019-01-31 DIAGNOSIS — I10 Essential (primary) hypertension: Secondary | ICD-10-CM | POA: Diagnosis not present

## 2019-02-01 ENCOUNTER — Telehealth: Payer: Self-pay | Admitting: *Deleted

## 2019-02-01 DIAGNOSIS — Z79899 Other long term (current) drug therapy: Secondary | ICD-10-CM

## 2019-02-01 DIAGNOSIS — I10 Essential (primary) hypertension: Secondary | ICD-10-CM

## 2019-02-01 LAB — BASIC METABOLIC PANEL
BUN / CREAT RATIO: 12 (ref 9–23)
BUN: 15 mg/dL (ref 6–24)
CALCIUM: 10.1 mg/dL (ref 8.7–10.2)
CHLORIDE: 101 mmol/L (ref 96–106)
CO2: 25 mmol/L (ref 20–29)
Creatinine, Ser: 1.22 mg/dL — ABNORMAL HIGH (ref 0.57–1.00)
GFR calc Af Amer: 60 mL/min/{1.73_m2} (ref >59)
GFR calc non Af Amer: 52 mL/min/{1.73_m2} — ABNORMAL LOW (ref >59)
Glucose: 86 mg/dL (ref 65–99)
Potassium: 3.9 mmol/L (ref 3.5–5.2)
Sodium: 139 mmol/L (ref 134–144)

## 2019-02-01 NOTE — Telephone Encounter (Signed)
-----   Message from Creig Hines, NP sent at 02/01/2019  8:47 AM EST ----- Creatinine mildly elevated on lisinopril-hctz.  Be sure to adequately hydrate and f/u bmet in 2 wks to reassess.

## 2019-02-01 NOTE — Telephone Encounter (Signed)
Results called to pt. Pt verbalized understanding. She is scheduled for 3/19 at 3:30 pm to come in for lab work.

## 2019-02-15 ENCOUNTER — Other Ambulatory Visit: Payer: Self-pay

## 2019-02-15 ENCOUNTER — Other Ambulatory Visit (INDEPENDENT_AMBULATORY_CARE_PROVIDER_SITE_OTHER): Payer: BLUE CROSS/BLUE SHIELD

## 2019-02-15 DIAGNOSIS — Z79899 Other long term (current) drug therapy: Secondary | ICD-10-CM

## 2019-02-15 DIAGNOSIS — I10 Essential (primary) hypertension: Secondary | ICD-10-CM | POA: Diagnosis not present

## 2019-02-16 LAB — BASIC METABOLIC PANEL
BUN / CREAT RATIO: 18 (ref 9–23)
BUN: 18 mg/dL (ref 6–24)
CO2: 21 mmol/L (ref 20–29)
CREATININE: 0.98 mg/dL (ref 0.57–1.00)
Calcium: 9.8 mg/dL (ref 8.7–10.2)
Chloride: 101 mmol/L (ref 96–106)
GFR calc Af Amer: 78 mL/min/{1.73_m2} (ref >59)
GFR calc non Af Amer: 67 mL/min/{1.73_m2} (ref >59)
GLUCOSE: 88 mg/dL (ref 65–99)
Potassium: 4 mmol/L (ref 3.5–5.2)
SODIUM: 139 mmol/L (ref 134–144)

## 2019-02-16 NOTE — Progress Notes (Signed)
Pt called and lab results reviewed. Pt verbalized understanding.

## 2019-09-24 DIAGNOSIS — M7751 Other enthesopathy of right foot: Secondary | ICD-10-CM | POA: Diagnosis not present

## 2019-09-24 DIAGNOSIS — M79671 Pain in right foot: Secondary | ICD-10-CM | POA: Diagnosis not present

## 2020-01-13 ENCOUNTER — Encounter: Payer: Self-pay | Admitting: Emergency Medicine

## 2020-01-13 ENCOUNTER — Inpatient Hospital Stay
Admission: EM | Admit: 2020-01-13 | Discharge: 2020-01-16 | DRG: 281 | Disposition: A | Discharge status: Home / Self Care | Payer: BC Managed Care – PPO | Attending: Internal Medicine | Admitting: Internal Medicine

## 2020-01-13 ENCOUNTER — Emergency Department: Payer: BC Managed Care – PPO

## 2020-01-13 ENCOUNTER — Other Ambulatory Visit: Payer: Self-pay

## 2020-01-13 DIAGNOSIS — I429 Cardiomyopathy, unspecified: Secondary | ICD-10-CM | POA: Diagnosis not present

## 2020-01-13 DIAGNOSIS — Z7982 Long term (current) use of aspirin: Secondary | ICD-10-CM | POA: Diagnosis not present

## 2020-01-13 DIAGNOSIS — E785 Hyperlipidemia, unspecified: Secondary | ICD-10-CM | POA: Diagnosis not present

## 2020-01-13 DIAGNOSIS — I214 Non-ST elevation (NSTEMI) myocardial infarction: Secondary | ICD-10-CM | POA: Diagnosis not present

## 2020-01-13 DIAGNOSIS — I519 Heart disease, unspecified: Secondary | ICD-10-CM | POA: Diagnosis not present

## 2020-01-13 DIAGNOSIS — F17210 Nicotine dependence, cigarettes, uncomplicated: Secondary | ICD-10-CM | POA: Diagnosis present

## 2020-01-13 DIAGNOSIS — I340 Nonrheumatic mitral (valve) insufficiency: Secondary | ICD-10-CM | POA: Diagnosis present

## 2020-01-13 DIAGNOSIS — F17200 Nicotine dependence, unspecified, uncomplicated: Secondary | ICD-10-CM | POA: Diagnosis not present

## 2020-01-13 DIAGNOSIS — I2510 Atherosclerotic heart disease of native coronary artery without angina pectoris: Secondary | ICD-10-CM | POA: Diagnosis not present

## 2020-01-13 DIAGNOSIS — Z20822 Contact with and (suspected) exposure to covid-19: Secondary | ICD-10-CM | POA: Diagnosis not present

## 2020-01-13 DIAGNOSIS — F419 Anxiety disorder, unspecified: Secondary | ICD-10-CM | POA: Diagnosis not present

## 2020-01-13 DIAGNOSIS — R079 Chest pain, unspecified: Secondary | ICD-10-CM | POA: Diagnosis not present

## 2020-01-13 DIAGNOSIS — Z79899 Other long term (current) drug therapy: Secondary | ICD-10-CM

## 2020-01-13 DIAGNOSIS — Z720 Tobacco use: Secondary | ICD-10-CM | POA: Diagnosis not present

## 2020-01-13 DIAGNOSIS — R0602 Shortness of breath: Secondary | ICD-10-CM | POA: Diagnosis not present

## 2020-01-13 DIAGNOSIS — I10 Essential (primary) hypertension: Secondary | ICD-10-CM | POA: Diagnosis present

## 2020-01-13 DIAGNOSIS — R739 Hyperglycemia, unspecified: Secondary | ICD-10-CM | POA: Diagnosis not present

## 2020-01-13 LAB — CBC
HCT: 43.3 % (ref 36.0–46.0)
Hemoglobin: 15 g/dL (ref 12.0–15.0)
MCH: 33.5 pg (ref 26.0–34.0)
MCHC: 34.6 g/dL (ref 30.0–36.0)
MCV: 96.7 fL (ref 80.0–100.0)
Platelets: 190 10*3/uL (ref 150–400)
RBC: 4.48 MIL/uL (ref 3.87–5.11)
RDW: 12.4 % (ref 11.5–15.5)
WBC: 8.4 10*3/uL (ref 4.0–10.5)
nRBC: 0 % (ref 0.0–0.2)

## 2020-01-13 LAB — PROTIME-INR
INR: 1 (ref 0.8–1.2)
Prothrombin Time: 12.6 seconds (ref 11.4–15.2)

## 2020-01-13 LAB — BASIC METABOLIC PANEL
Anion gap: 8 (ref 5–15)
BUN: 23 mg/dL — ABNORMAL HIGH (ref 6–20)
CO2: 26 mmol/L (ref 22–32)
Calcium: 9.4 mg/dL (ref 8.9–10.3)
Chloride: 104 mmol/L (ref 98–111)
Creatinine, Ser: 1.06 mg/dL — ABNORMAL HIGH (ref 0.44–1.00)
GFR calc Af Amer: >60 mL/min (ref >60)
GFR calc non Af Amer: >60 mL/min (ref >60)
Glucose, Bld: 122 mg/dL — ABNORMAL HIGH (ref 70–99)
Potassium: 4.2 mmol/L (ref 3.5–5.1)
Sodium: 138 mmol/L (ref 135–145)

## 2020-01-13 LAB — TROPONIN I (HIGH SENSITIVITY)
Troponin I (High Sensitivity): 6334 ng/L (ref <18)
Troponin I (High Sensitivity): 9364 ng/L (ref <18)

## 2020-01-13 LAB — APTT: aPTT: 29 seconds (ref 24–36)

## 2020-01-13 LAB — HEPARIN LEVEL (UNFRACTIONATED): Heparin Unfractionated: <0.1 IU/mL — ABNORMAL LOW (ref 0.30–0.70)

## 2020-01-13 MED ORDER — INFLUENZA VAC SPLIT QUAD 0.5 ML IM SUSY: 0.5000 mL | PREFILLED_SYRINGE | INTRAMUSCULAR | Status: DC

## 2020-01-13 MED ORDER — NITROGLYCERIN 0.4 MG SL SUBL
0.4000 mg | SUBLINGUAL_TABLET | SUBLINGUAL | Status: DC | PRN
  Administered 2020-01-13 – 2020-01-16 (×6): 0.4 mg via SUBLINGUAL
  Filled 2020-01-13 (×2): qty 1

## 2020-01-13 MED ORDER — NITROGLYCERIN 0.4 MG SL SUBL
0.4000 mg | SUBLINGUAL_TABLET | SUBLINGUAL | Status: DC | PRN
  Administered 2020-01-13: 0.4 mg via SUBLINGUAL
  Filled 2020-01-13 (×2): qty 1

## 2020-01-13 MED ORDER — ALPRAZOLAM 0.25 MG PO TABS
0.2500 mg | ORAL_TABLET | Freq: Three times a day (TID) | ORAL | Status: DC | PRN
  Administered 2020-01-14 – 2020-01-16 (×4): 0.25 mg via ORAL
  Filled 2020-01-13 (×4): qty 1

## 2020-01-13 MED ORDER — HEPARIN BOLUS VIA INFUSION
4000.0000 [IU] | Freq: Once | INTRAVENOUS | Status: AC
  Administered 2020-01-13: 4000 [IU] via INTRAVENOUS
  Filled 2020-01-13: qty 4000

## 2020-01-13 MED ORDER — LABETALOL HCL 5 MG/ML IV SOLN: 10.0000 mg | INTRAVENOUS | Status: DC | PRN

## 2020-01-13 MED ORDER — SODIUM CHLORIDE 0.9% FLUSH: 3.0000 mL | Freq: Once | INTRAVENOUS | Status: DC

## 2020-01-13 MED ORDER — METOPROLOL TARTRATE 25 MG PO TABS
25.0000 mg | ORAL_TABLET | Freq: Two times a day (BID) | ORAL | Status: DC
  Administered 2020-01-13 – 2020-01-16 (×6): 25 mg via ORAL
  Filled 2020-01-13 (×6): qty 1

## 2020-01-13 MED ORDER — ALBUTEROL SULFATE HFA 108 (90 BASE) MCG/ACT IN AERS
2.0000 | INHALATION_SPRAY | Freq: Four times a day (QID) | RESPIRATORY_TRACT | Status: DC | PRN
  Filled 2020-01-13: qty 6.7

## 2020-01-13 MED ORDER — ATORVASTATIN CALCIUM 80 MG PO TABS
80.0000 mg | ORAL_TABLET | Freq: Every day | ORAL | Status: DC
  Administered 2020-01-13 – 2020-01-15 (×3): 80 mg via ORAL
  Filled 2020-01-13 (×3): qty 1

## 2020-01-13 MED ORDER — ONDANSETRON HCL 4 MG/2ML IJ SOLN: 4.0000 mg | Freq: Four times a day (QID) | INTRAMUSCULAR | Status: DC | PRN

## 2020-01-13 MED ORDER — ASPIRIN 81 MG PO CHEW
324.0000 mg | CHEWABLE_TABLET | Freq: Once | ORAL | Status: AC
  Administered 2020-01-13: 324 mg via ORAL
  Filled 2020-01-13: qty 4

## 2020-01-13 MED ORDER — ACETAMINOPHEN 325 MG PO TABS
650.0000 mg | ORAL_TABLET | ORAL | Status: DC | PRN
  Administered 2020-01-14 – 2020-01-15 (×5): 650 mg via ORAL
  Filled 2020-01-13 (×4): qty 2

## 2020-01-13 MED ORDER — ASPIRIN EC 81 MG PO TBEC: 81.0000 mg | DELAYED_RELEASE_TABLET | Freq: Every day | ORAL | Status: DC

## 2020-01-13 MED ORDER — NITROGLYCERIN 0.4 MG/SPRAY TL SOLN: 1.0000 | Status: DC | PRN

## 2020-01-13 MED ORDER — ASPIRIN EC 81 MG PO TBEC
81.0000 mg | DELAYED_RELEASE_TABLET | Freq: Every day | ORAL | Status: DC
  Administered 2020-01-14 – 2020-01-16 (×3): 81 mg via ORAL
  Filled 2020-01-13 (×3): qty 1

## 2020-01-13 MED ORDER — HEPARIN (PORCINE) 25000 UT/250ML-% IV SOLN
1200.0000 [IU]/h | INTRAVENOUS | Status: DC
  Administered 2020-01-13: 800 [IU]/h via INTRAVENOUS
  Administered 2020-01-14: 1200 [IU]/h via INTRAVENOUS
  Filled 2020-01-13 (×2): qty 250

## 2020-01-13 NOTE — Progress Notes (Addendum)
Pt C/o chest pain, mid chest and back, 7/10 intensity. EKG done. Sl NTG administered. Cliffton Asters APP on call notified.   IVT in to place new IV as left AC IV positional and causing pump to alar. Pt very anxious about pump alarming.

## 2020-01-13 NOTE — H&P (Signed)
History and Physical    HENLEY BLYTH KDX:[id number] DOB: [birth year] DOA: 01/13/2020  PCP: Patient, No Pcp Per  Patient coming from: Home  I have personally briefly reviewed patient's old medical records in [facility]  Chief Complaint: Chest pain  HPI: Victoria Avery is a 54 y.o. female with medical history significant of hypertension, tobacco abuse who presents for evaluation after acute onset of chest pressure x1 day.  Per the patient the pain started approximately 2 hours prior to presentation.  Patient does endorse some radiation to the back however denies any of the neck or down the arm.  No other associated symptoms.  Denies shortness of breath.  Patient's only cardiac history is hypertension however she does have a active smoking history.  Patient smokes 1 pack a day and has been doing so "forever".  On my evaluation patient is resting comfortably in bed.  She is in no visible distress.  She answers all questions appropriately.  She is alert and oriented x3.  Vital signs within normal limits.  Initial laboratory investigation positive for high-sensitivity troponin greater than 6000.  EKG demonstrates normal sinus rhythm with nonspecific ST-T wave changes.  Cardiology consulted from the emergency department and recommended initiation of heparin infusion, nitroglycerin, aspirin with plans to trend troponin and evaluate patient for cardiac catheterization.   ED Course: On admission vital signs were overall stable except for some mild hypertension.  Initial labs revealed troponin as above.  ED contacted cardiology who recommended initiation of heparin infusion with plans to trend troponin and evaluate for need for catheterization.  Patient was administered IV heparin infusion, aspirin, and sublingual nitroglycerin.  Hospitalist called for admission.  Review of Systems: As per HPI otherwise 10 point review of systems negative.    Past Medical History:  Diagnosis Date  .  Cardiomyopathy (HCC)    a. 10/2017 Echo: EF 45-50% Gr1 DD, mild MR. Nl LA size. Nl RV fxn and PASP.  Marland Kitchen Hypertension   . Tobacco abuse     Past Surgical History:  Procedure Laterality Date  . CESAREAN SECTION       reports that she has been smoking cigarettes. She has a 30.00 pack-year smoking history. She has never used smokeless tobacco. She reports current alcohol use. She reports that she does not use drugs.  No Known Allergies  History reviewed. No pertinent family history. Family history negative for heart disease  Prior to Admission medications   Medication Sig Start Date End Date Taking? Authorizing Provider  albuterol (PROVENTIL HFA;VENTOLIN HFA) 108 (90 Base) MCG/ACT inhaler Inhale 2 puffs into the lungs every 6 (six) hours as needed for wheezing or shortness of breath. 11/26/17   Katha Hamming, MD  aspirin EC 81 MG EC tablet Take 1 tablet (81 mg total) by mouth daily. 11/27/17   Katha Hamming, MD  lisinopril-hydrochlorothiazide (PRINZIDE,ZESTORETIC) 20-12.5 MG tablet Take 1.5 tablets (30/18.75MG ) by mouth once daily 01/30/19   04/01/19, NP  metoprolol succinate (TOPROL-XL) 50 MG 24 hr tablet Take 1 tablet (50 mg total) by mouth daily. Take with or immediately following a meal. 01/30/19   04/01/19, NP    Physical Exam: Vitals:   01/13/20 1514 01/13/20 1614 01/13/20 1628 01/13/20 1629  BP:    (!) 143/95  Pulse:  73 92   Resp:  12 16   Temp:      TempSrc:      SpO2:  98% 99%   Weight: 86.2 kg  Height: 5\' 2"  (1.575 m)       Vitals:   01/13/20 1514 01/13/20 1614 01/13/20 1628 01/13/20 1629  BP:    (!) 143/95  Pulse:  73 92   Resp:  12 16   Temp:      TempSrc:      SpO2:  98% 99%   Weight: 86.2 kg     Height: 5\' 2"  (1.575 m)      Constitutional: NAD, calm, comfortable Eyes: PERRL, lids and conjunctivae normal ENMT: Mucous membranes are moist. Posterior pharynx clear of any exudate or lesions.Normal dentition.    Neck: normal, supple, no masses, no thyromegaly Respiratory: clear to auscultation bilaterally, no wheezing, no crackles. Normal respiratory effort. No accessory muscle use.  Cardiovascular: Regular rate and rhythm, no murmurs / rubs / gallops. No extremity edema. 2+ pedal pulses. No carotid bruits.  Abdomen: no tenderness, no masses palpated. No hepatosplenomegaly. Bowel sounds positive.  Musculoskeletal: no clubbing / cyanosis. No joint deformity upper and lower extremities. Good ROM, no contractures. Normal muscle tone.  Skin: no rashes, lesions, ulcers. No induration Neurologic: CN 2-12 grossly intact. Sensation intact, DTR normal. Strength 5/5 in all 4.  Psychiatric: Normal judgment and insight. Alert and oriented x 3. Normal mood.    Labs on Admission: I have personally reviewed following labs and imaging studies  CBC: Recent Labs  Lab 01/13/20 1517  WBC 8.4  HGB 15.0  HCT 43.3  MCV 96.7  PLT 190   Basic Metabolic Panel: Recent Labs  Lab 01/13/20 1517  NA 138  K 4.2  CL 104  CO2 26  GLUCOSE 122*  BUN 23*  CREATININE 1.06*  CALCIUM 9.4   GFR: Estimated Creatinine Clearance: 63.9 mL/min (A) (by C-G formula based on SCr of 1.06 mg/dL (H)). Liver Function Tests: No results for input(s): AST, ALT, ALKPHOS, BILITOT, PROT, ALBUMIN in the last 168 hours. No results for input(s): LIPASE, AMYLASE in the last 168 hours. No results for input(s): AMMONIA in the last 168 hours. Coagulation Profile: No results for input(s): INR, PROTIME in the last 168 hours. Cardiac Enzymes: No results for input(s): CKTOTAL, CKMB, CKMBINDEX, TROPONINI in the last 168 hours. BNP (last 3 results) No results for input(s): PROBNP in the last 8760 hours. HbA1C: No results for input(s): HGBA1C in the last 72 hours. CBG: No results for input(s): GLUCAP in the last 168 hours. Lipid Profile: No results for input(s): CHOL, HDL, LDLCALC, TRIG, CHOLHDL, LDLDIRECT in the last 72 hours. Thyroid  Function Tests: No results for input(s): TSH, T4TOTAL, FREET4, T3FREE, THYROIDAB in the last 72 hours. Anemia Panel: No results for input(s): VITAMINB12, FOLATE, FERRITIN, TIBC, IRON, RETICCTPCT in the last 72 hours. Urine analysis:    Component Value Date/Time   COLORURINE YELLOW (A) 11/25/2017 1638   APPEARANCEUR CLEAR (A) 11/25/2017 1638   LABSPEC 1.010 11/25/2017 1638   PHURINE 6.0 11/25/2017 1638   GLUCOSEU NEGATIVE 11/25/2017 1638   HGBUR MODERATE (A) 11/25/2017 1638   BILIRUBINUR NEGATIVE 11/25/2017 1638   KETONESUR 5 (A) 11/25/2017 1638   PROTEINUR NEGATIVE 11/25/2017 1638   NITRITE NEGATIVE 11/25/2017 1638   LEUKOCYTESUR NEGATIVE 11/25/2017 1638    Radiological Exams on Admission: DG Chest 2 View  Result Date: 01/13/2020 CLINICAL DATA:  Chest pain chest tightness mild shortness of breath EXAM: CHEST - 2 VIEW COMPARISON:  None. FINDINGS: The heart size and mediastinal contours are within normal limits. Both lungs are clear. The visualized skeletal structures are unremarkable. IMPRESSION: No active cardiopulmonary disease.  Electronically Signed   By: Donavan Foil M.D.   On: 01/13/2020 15:33    EKG: Independently reviewed.  Sinus rhythm  Assessment/Plan Active Problems:   NSTEMI (non-ST elevated myocardial infarction) ([place])  NSTEMI Patient with pressure-like chest pain 2 to 3 hours prior to admission Does have history of hypertension and tobacco abuse No known family history of coronary disease Plan: Admit to telemetry Trend troponins Heparin GTT Metoprolol tartrate 25 mg twice daily Atorvastatin 80 mg daily Daily aspirin Nitro sublingual as needed 2D echo Cardiac diet for today, n.p.o. after midnight in case any intervention necessary Dr. Nehemiah Massed consulted by emergency department  Hypertension Metoprolol tartrate 25 mg twice daily As needed labetalol  Tobacco abuse Counseled patient Offered nicotine patch, patient:  DVT prophylaxis: Heparin GTT Code  Status: Full code Family Communication: None Disposition Plan: Home, anticipate 24 to 48 hours Consults called: Cardiology-[place] Admission status: Telemetry   Sidney Ace MD [facility] Pager [phone]  If 7PM-7AM, please contact night-coverage   01/13/2020, 4:43 PM

## 2020-01-13 NOTE — Progress Notes (Signed)
NTG repeated, no relief from first NTG

## 2020-01-13 NOTE — ED Notes (Signed)
Placed pt on cardiac monitor in room

## 2020-01-13 NOTE — ED Notes (Signed)
Called to give heads up on pt arrival. RN busy per Licensed conveyancer. Will call back. Room 235 per Unit.

## 2020-01-13 NOTE — ED Notes (Signed)
See triage note. Pt c/o chest pressure only at this time. States she was seen for HTN years ago and follows with a cardiologist.

## 2020-01-13 NOTE — ED Provider Notes (Addendum)
[facility] Emergency Department Provider Note  ____________________________________________  Time seen: Approximately 4:09 PM  I have reviewed the triage vital signs and the nursing notes.   HISTORY  Chief Complaint Chest Pain    HPI Victoria Avery is a 54 y.o. female who presents the emergency department complaining of substernal pressure-like chest pain.  Patient states that she began to experience this symptom approximately 2 to 3 hours ago.  Patient denies any radiation to her neck, down the left arm.  Patient states that it does feel like it is traveling to her back somewhat.  She denies any other complaints of fevers or chills, nasal congestion, sore throat, shortness of breath, abdominal pain, nausea vomiting, diarrhea or constipation.  Patient does have a history of cardiomyopathy, hypertension.  Patient does not have any other cardiac history such as CHF, MI.  Patient does not take any medications prior to arrival.         Past Medical History:  Diagnosis Date  . Cardiomyopathy (HCC)    a. 10/2017 Echo: EF 45-50% Gr1 DD, mild MR. Nl LA size. Nl RV fxn and PASP.  Marland Kitchen Hypertension   . Tobacco abuse     Patient Active Problem List   Diagnosis Date Noted  . NSTEMI (non-ST elevated myocardial infarction) (HCC) 01/13/2020  . Hypertensive urgency 12/10/2017  . Smoker 12/10/2017  . Elevated troponin 12/10/2017  . Hyperlipidemia 12/10/2017  . Sinusitis 12/10/2017  . HTN (hypertension) 11/25/2017    Past Surgical History:  Procedure Laterality Date  . CESAREAN SECTION      Prior to Admission medications   Medication Sig Start Date End Date Taking? Authorizing Provider  lisinopril-hydrochlorothiazide (ZESTORETIC) 20-12.5 MG tablet Take 1.5 tablets by mouth daily.    Yes [provider]  metoprolol succinate (TOPROL-XL) 50 MG 24 hr tablet Take 50 mg by mouth daily. Take with or immediately following a meal.   Yes [provider]    Allergies Patient has no known allergies.  History reviewed. No pertinent family history.  Social History Social History   Tobacco Use  . Smoking status: Current Every Day Smoker    Packs/day: 1.00    Years: 30.00    Pack years: 30.00    Types: Cigarettes  . Smokeless tobacco: Never Used  Substance Use Topics  . Alcohol use: Yes    Comment: social   . Drug use: No     Review of Systems  Constitutional: No fever/chills Eyes: No visual changes. No discharge ENT: No upper respiratory complaints. Cardiovascular: Positive for substernal chest pain. Respiratory: no cough. No SOB. Gastrointestinal: No abdominal pain.  No nausea, no vomiting.  No diarrhea.  No constipation. Musculoskeletal: Negative for musculoskeletal pain. Skin: Negative for rash, abrasions, lacerations, ecchymosis. Neurological: Negative for headaches, focal weakness or numbness. 10-point ROS otherwise negative.  ____________________________________________   PHYSICAL EXAM:  VITAL SIGNS: ED Triage Vitals  Enc Vitals Group     BP 01/13/20 1513 (!) 166/96     Pulse Rate 01/13/20 1513 79     Resp 01/13/20 1513 18     Temp 01/13/20 1513 98.5 F (36.9 C)     Temp Source 01/13/20 1513 Oral     SpO2 01/13/20 1513 97 %     Weight 01/13/20 1514 190 lb (86.2 kg)     Height 01/13/20 1514 5\' 2"  (1.575 m)     Head Circumference --      Peak Flow --  Pain Score 01/13/20 1513 2     Pain Loc --      Pain Edu? --      Excl. in GC? --      Constitutional: Alert and oriented. Well appearing and in no acute distress. Eyes: Conjunctivae are normal. PERRL. EOMI. Head: Atraumatic. ENT:      Ears:       Nose: No congestion/rhinnorhea.      Mouth/Throat: Mucous membranes are moist.  Neck: No stridor.   Hematological/Lymphatic/Immunilogical: No cervical lymphadenopathy. Cardiovascular: Normal rate, regular rhythm. Normal S1 and S2.  No murmurs, rubs, gallops.  No apical heave.  Good peripheral  circulation. Respiratory: Normal respiratory effort without tachypnea or retractions. Lungs CTAB. Good air entry to the bases with no decreased or absent breath sounds. Musculoskeletal: Full range of motion to all extremities. No gross deformities appreciated. Neurologic:  Normal speech and language. No gross focal neurologic deficits are appreciated.  Skin:  Skin is warm, dry and intact. No rash noted. Psychiatric: Mood and affect are normal. Speech and behavior are normal. Patient exhibits appropriate insight and judgement.   ____________________________________________   LABS (all labs ordered are listed, but only abnormal results are displayed)  Labs Reviewed  BASIC METABOLIC PANEL - Abnormal; Notable for the following components:      Result Value   Glucose, Bld 122 (*)    BUN 23 (*)    Creatinine, Ser 1.06 (*)    All other components within normal limits  TROPONIN I (HIGH SENSITIVITY) - Abnormal; Notable for the following components:   Troponin I (High Sensitivity) 6,334 (*)    All other components within normal limits  SARS CORONAVIRUS 2 (TAT 6-24 HRS)  CBC  PROTIME-INR  APTT  HIV ANTIBODY (ROUTINE TESTING W REFLEX)  BASIC METABOLIC PANEL  CBC  PROTIME-INR  HEPARIN LEVEL (UNFRACTIONATED)  TROPONIN I (HIGH SENSITIVITY)   ____________________________________________  EKG   ____________________________________________  RADIOLOGY I personally viewed and evaluated these images as part of my medical decision making, as well as reviewing the written report by the radiologist.  DG Chest 2 View  Result Date: 01/13/2020 CLINICAL DATA:  Chest pain chest tightness mild shortness of breath EXAM: CHEST - 2 VIEW COMPARISON:  None. FINDINGS: The heart size and mediastinal contours are within normal limits. Both lungs are clear. The visualized skeletal structures are unremarkable. IMPRESSION: No active cardiopulmonary disease. Electronically Signed   By: Jasmine Pang M.D.   On:  01/13/2020 15:33    ____________________________________________    PROCEDURES  Procedure(s) performed:    .Critical Care Performed by: Racheal Patches, PA-C Authorized by: Racheal Patches, PA-C   Critical care provider statement:    Critical care time (minutes):  30   Critical care time was exclusive of:  Separately billable procedures and treating other patients   Critical care was necessary to treat or prevent imminent or life-threatening deterioration of the following conditions:  Cardiac failure   Critical care was time spent personally by me on the following activities:  Obtaining history from patient or surrogate, development of treatment plan with patient or surrogate, examination of patient, discussions with consultants, ordering and performing treatments and interventions, ordering and review of laboratory studies, ordering and review of radiographic studies and re-evaluation of patient's condition Comments:     Patient's findings were consistent with NSTEMI.  Discussed patient's symptoms, physical exam, EKG, labs with cardiology.  Recommended starting heparin, nitro, aspirin.  Admit to hospital service.      Medications  sodium chloride flush (NS) 0.9 % injection 3 mL (3 mLs Intravenous Not Given 01/13/20 1621)  heparin ADULT infusion 100 units/mL (25000 units/262mL sodium chloride 0.45%) (800 Units/hr Intravenous Transfusing/Transfer 01/13/20 1715)  aspirin EC tablet 81 mg (81 mg Oral Not Given 01/13/20 1708)  albuterol (VENTOLIN HFA) 108 (90 Base) MCG/ACT inhaler 2 puff (has no administration in time range)  nitroGLYCERIN (NITROSTAT) SL tablet 0.4 mg (0.4 mg Sublingual Given 01/13/20 1708)  acetaminophen (TYLENOL) tablet 650 mg (has no administration in time range)  ondansetron (ZOFRAN) injection 4 mg (has no administration in time range)  metoprolol tartrate (LOPRESSOR) tablet 25 mg (has no administration in time range)  atorvastatin (LIPITOR) tablet 80 mg  (has no administration in time range)  aspirin chewable tablet 324 mg (324 mg Oral Given 01/13/20 1637)  heparin bolus via infusion 4,000 Units (4,000 Units Intravenous Bolus from Bag 01/13/20 1706)     ____________________________________________   INITIAL IMPRESSION / ASSESSMENT AND PLAN / ED COURSE  Pertinent labs & imaging results that were available during my care of the patient were reviewed by me and considered in my medical decision making (see chart for details).  Review of the [place] CSRS was performed in accordance of the [place] prior to dispensing any controlled drugs.           Patient's diagnosis is consistent with NSTEMI.  Patient presented to emergency department complaining of substernal chest pain.  Patient had no cardiac history other than hypertension.  Basic labs were reassuring however her troponin was 6334.  At this time, I consulted cardiology who advised to start patient on heparin, nitro, aspirin, admit to the hospital service for observation and trending of troponin.. Patient is started on heparin, nitro, aspirin.  Patient will be admitted to the hospitalist service.  Hospital service accepts the patient.     ____________________________________________  FINAL CLINICAL IMPRESSION(S) / ED DIAGNOSES  Final diagnoses:  NSTEMI (non-ST elevated myocardial infarction) ([place])      NEW MEDICATIONS STARTED DURING THIS VISIT:  ED Discharge Orders    None          This chart was dictated using voice recognition software/Dragon. Despite best efforts to proofread, errors can occur which can change the meaning. Any change was purely unintentional.    Darletta Moll, PA-C 01/13/20 1715    Denell Cothern, Charline Bills, PA-C 01/13/20 Cato Mulligan    Lavonia Drafts, MD 01/13/20 [phone]

## 2020-01-13 NOTE — Progress Notes (Signed)
ANTICOAGULATION CONSULT NOTE - Initial Consult  Pharmacy Consult for Heparin  Indication: chest pain/ACS  No Known Allergies  Patient Measurements: Height: 5\' 2"  (157.5 cm) Weight: 190 lb (86.2 kg) IBW/kg (Calculated) : 50.1 Heparin Dosing Weight: 69.7 kg   Vital Signs: Temp: 98.5 F (36.9 C) (02/14 1513) Temp Source: Oral (02/14 1513) BP: 143/95 (02/14 1629) Pulse Rate: 92 (02/14 1628)  Labs: Recent Labs    01/13/20 1517  HGB 15.0  HCT 43.3  PLT 190  CREATININE 1.06*  TROPONINIHS 6,334*    Estimated Creatinine Clearance: 63.9 mL/min (A) (by C-G formula based on SCr of 1.06 mg/dL (H)).   Medical History: Past Medical History:  Diagnosis Date  . Cardiomyopathy (HCC)    a. 10/2017 Echo: EF 45-50% Gr1 DD, mild MR. Nl LA size. Nl RV fxn and PASP.  11/2017 Hypertension   . Tobacco abuse     Medications:  (Not in a hospital admission)   Assessment: Pharmacy consulted to dose heparin in this 54 year old female admitted with ACS/NSTEMI.  CrCl = 63.9 ml/min , no prior anticoag noted.   Goal of Therapy:  Heparin level 0.3-0.7 units/ml Monitor platelets by anticoagulation protocol: Yes   Plan:  Give 4000 units bolus x 1 Start heparin infusion at 800 units/hr Check anti-Xa level in 6 hours and daily while on heparin Continue to monitor H&H and platelets  Victoria Avery D 01/13/2020,4:33 PM

## 2020-01-13 NOTE — ED Triage Notes (Signed)
Pt to triage with c/o chest tightness that started approximately 1 hours PTA.  Pt staets mild sHOB, denies n/v.  Pt denies precipitating or alleviating factors.  States pain is mostly subsided at this time.

## 2020-01-13 NOTE — ED Notes (Signed)
Report given to Jennifer RN

## 2020-01-14 ENCOUNTER — Encounter
Admission: EM | Disposition: A | Discharge status: Home / Self Care | Payer: Self-pay | Source: Home / Self Care | Attending: Internal Medicine

## 2020-01-14 ENCOUNTER — Encounter: Payer: Self-pay | Admitting: Internal Medicine

## 2020-01-14 ENCOUNTER — Inpatient Hospital Stay (HOSPITAL_COMMUNITY)
Admit: 2020-01-14 | Discharge: 2020-01-14 | Disposition: A | Discharge status: Home / Self Care | Payer: BC Managed Care – PPO | Attending: Internal Medicine | Admitting: Internal Medicine

## 2020-01-14 DIAGNOSIS — Z720 Tobacco use: Secondary | ICD-10-CM

## 2020-01-14 DIAGNOSIS — I519 Heart disease, unspecified: Secondary | ICD-10-CM

## 2020-01-14 DIAGNOSIS — I214 Non-ST elevation (NSTEMI) myocardial infarction: Principal | ICD-10-CM

## 2020-01-14 DIAGNOSIS — I10 Essential (primary) hypertension: Secondary | ICD-10-CM

## 2020-01-14 DIAGNOSIS — E785 Hyperlipidemia, unspecified: Secondary | ICD-10-CM

## 2020-01-14 DIAGNOSIS — I2510 Atherosclerotic heart disease of native coronary artery without angina pectoris: Secondary | ICD-10-CM

## 2020-01-14 HISTORY — PX: LEFT HEART CATH AND CORONARY ANGIOGRAPHY: CATH118249

## 2020-01-14 LAB — ECHOCARDIOGRAM COMPLETE
Height: 62 in
Weight: 2821.89 oz

## 2020-01-14 LAB — CBC
HCT: 41.7 % (ref 36.0–46.0)
Hemoglobin: 14.3 g/dL (ref 12.0–15.0)
MCH: 32.7 pg (ref 26.0–34.0)
MCHC: 34.3 g/dL (ref 30.0–36.0)
MCV: 95.4 fL (ref 80.0–100.0)
Platelets: 168 10*3/uL (ref 150–400)
RBC: 4.37 MIL/uL (ref 3.87–5.11)
RDW: 12.3 % (ref 11.5–15.5)
WBC: 7.1 10*3/uL (ref 4.0–10.5)
nRBC: 0 % (ref 0.0–0.2)

## 2020-01-14 LAB — SARS CORONAVIRUS 2 (TAT 6-24 HRS): SARS Coronavirus 2: NEGATIVE

## 2020-01-14 LAB — LIPID PANEL
Cholesterol: 197 mg/dL (ref 0–200)
HDL: 38 mg/dL — ABNORMAL LOW (ref >40)
LDL Cholesterol: 112 mg/dL — ABNORMAL HIGH (ref 0–99)
Total CHOL/HDL Ratio: 5.2 RATIO
Triglycerides: 234 mg/dL — ABNORMAL HIGH (ref <150)
VLDL: 47 mg/dL — ABNORMAL HIGH (ref 0–40)

## 2020-01-14 LAB — BASIC METABOLIC PANEL
Anion gap: 9 (ref 5–15)
BUN: 18 mg/dL (ref 6–20)
CO2: 24 mmol/L (ref 22–32)
Calcium: 9.2 mg/dL (ref 8.9–10.3)
Chloride: 102 mmol/L (ref 98–111)
Creatinine, Ser: 0.97 mg/dL (ref 0.44–1.00)
GFR calc Af Amer: >60 mL/min (ref >60)
GFR calc non Af Amer: >60 mL/min (ref >60)
Glucose, Bld: 140 mg/dL — ABNORMAL HIGH (ref 70–99)
Potassium: 3.9 mmol/L (ref 3.5–5.1)
Sodium: 135 mmol/L (ref 135–145)

## 2020-01-14 LAB — PROTIME-INR
INR: 1 (ref 0.8–1.2)
Prothrombin Time: 12.8 seconds (ref 11.4–15.2)

## 2020-01-14 LAB — TROPONIN I (HIGH SENSITIVITY)
Troponin I (High Sensitivity): 10477 ng/L (ref <18)
Troponin I (High Sensitivity): 11713 ng/L (ref <18)

## 2020-01-14 LAB — HEPARIN LEVEL (UNFRACTIONATED)
Heparin Unfractionated: 0.19 IU/mL — ABNORMAL LOW (ref 0.30–0.70)
Heparin Unfractionated: 0.37 IU/mL (ref 0.30–0.70)

## 2020-01-14 LAB — HEMOGLOBIN A1C
Hgb A1c MFr Bld: 5.7 % — ABNORMAL HIGH (ref 4.8–5.6)
Mean Plasma Glucose: 116.89 mg/dL

## 2020-01-14 LAB — HIV ANTIBODY (ROUTINE TESTING W REFLEX): HIV Screen 4th Generation wRfx: NONREACTIVE

## 2020-01-14 SURGERY — LEFT HEART CATH AND CORONARY ANGIOGRAPHY
Anesthesia: Moderate Sedation

## 2020-01-14 MED ORDER — HEPARIN SODIUM (PORCINE) 1000 UNIT/ML IJ SOLN
INTRAMUSCULAR | Status: DC | PRN
  Administered 2020-01-14: 4000 [IU] via INTRAVENOUS

## 2020-01-14 MED ORDER — IOHEXOL 300 MG/ML  SOLN
INTRAMUSCULAR | Status: DC | PRN
  Administered 2020-01-14: 45 mL

## 2020-01-14 MED ORDER — SODIUM CHLORIDE 0.9 % IV SOLN: INTRAVENOUS | Status: DC

## 2020-01-14 MED ORDER — HEPARIN BOLUS VIA INFUSION
2100.0000 [IU] | Freq: Once | INTRAVENOUS | Status: AC
  Administered 2020-01-14: 2100 [IU] via INTRAVENOUS
  Filled 2020-01-14: qty 2100

## 2020-01-14 MED ORDER — SODIUM CHLORIDE 0.9% FLUSH: 3.0000 mL | INTRAVENOUS | Status: DC | PRN

## 2020-01-14 MED ORDER — SODIUM CHLORIDE 0.9 % IV SOLN: INTRAVENOUS | Status: AC

## 2020-01-14 MED ORDER — HEPARIN SODIUM (PORCINE) 1000 UNIT/ML IJ SOLN
INTRAMUSCULAR | Status: AC
  Filled 2020-01-14: qty 1

## 2020-01-14 MED ORDER — VERAPAMIL HCL 2.5 MG/ML IV SOLN
INTRAVENOUS | Status: DC | PRN
  Administered 2020-01-14: 2.5 mg via INTRA_ARTERIAL

## 2020-01-14 MED ORDER — VERAPAMIL HCL 2.5 MG/ML IV SOLN
INTRAVENOUS | Status: AC
  Filled 2020-01-14: qty 2

## 2020-01-14 MED ORDER — SODIUM CHLORIDE 0.9 % IV SOLN: 250.0000 mL | INTRAVENOUS | Status: DC | PRN

## 2020-01-14 MED ORDER — SODIUM CHLORIDE 0.9% FLUSH: 3.0000 mL | Freq: Two times a day (BID) | INTRAVENOUS | Status: DC

## 2020-01-14 MED ORDER — FENTANYL CITRATE (PF) 100 MCG/2ML IJ SOLN
INTRAMUSCULAR | Status: AC
  Filled 2020-01-14: qty 2

## 2020-01-14 MED ORDER — MORPHINE SULFATE (PF) 2 MG/ML IV SOLN
2.0000 mg | INTRAVENOUS | Status: DC | PRN
  Administered 2020-01-15: 2 mg via INTRAVENOUS
  Filled 2020-01-14: qty 1

## 2020-01-14 MED ORDER — HEPARIN (PORCINE) 25000 UT/250ML-% IV SOLN
1600.0000 [IU]/h | INTRAVENOUS | Status: DC
  Administered 2020-01-15: 1200 [IU]/h via INTRAVENOUS
  Administered 2020-01-15 – 2020-01-16 (×2): 1600 [IU]/h via INTRAVENOUS
  Filled 2020-01-14 (×3): qty 250

## 2020-01-14 MED ORDER — SODIUM CHLORIDE 0.9% FLUSH
3.0000 mL | Freq: Two times a day (BID) | INTRAVENOUS | Status: DC
  Administered 2020-01-14 – 2020-01-16 (×4): 3 mL via INTRAVENOUS

## 2020-01-14 MED ORDER — FENTANYL CITRATE (PF) 100 MCG/2ML IJ SOLN
INTRAMUSCULAR | Status: DC | PRN
  Administered 2020-01-14: 50 ug via INTRAVENOUS

## 2020-01-14 MED ORDER — HEPARIN (PORCINE) IN NACL 1000-0.9 UT/500ML-% IV SOLN
INTRAVENOUS | Status: DC | PRN
  Administered 2020-01-14: 500 mL

## 2020-01-14 MED ORDER — MIDAZOLAM HCL 2 MG/2ML IJ SOLN
INTRAMUSCULAR | Status: DC | PRN
  Administered 2020-01-14: 1 mg via INTRAVENOUS

## 2020-01-14 MED ORDER — MIDAZOLAM HCL 2 MG/2ML IJ SOLN
INTRAMUSCULAR | Status: AC
  Filled 2020-01-14: qty 2

## 2020-01-14 MED ORDER — ACETAMINOPHEN 325 MG PO TABS
ORAL_TABLET | ORAL | Status: AC
  Filled 2020-01-14: qty 2

## 2020-01-14 SURGICAL SUPPLY — 6 items
CATH INFINITI 5FR JK (CATHETERS) ×2 IMPLANT
DEVICE RAD TR BAND REGULAR (VASCULAR PRODUCTS) ×2 IMPLANT
GLIDESHEATH SLEND SS 6F .021 (SHEATH) ×2 IMPLANT
KIT MANI 3VAL PERCEP (MISCELLANEOUS) ×2 IMPLANT
PACK CARDIAC CATH (CUSTOM PROCEDURE TRAY) ×2 IMPLANT
WIRE ROSEN-J .035X260CM (WIRE) ×2 IMPLANT

## 2020-01-14 NOTE — Progress Notes (Signed)
Heparin adjusted as ordered. Bolus 2100 units given and drip increased to 1000 units per hour. Pt was sleeping soundly, but awoke when this RN entered room to adjust heparin. She reports 3/10 intensity CP and headache. Given Tylenol and Xanax. Reminded of NPO after midnight status. Troponin trending upwards, cycling every 2 hours; B. Morrison notified of troponin greater than 13,000. Pt notified of q 2 hour blood draws for troponin. Pt verbalized understanding of all information given. Cliffton Asters updated regarding pt's troponin, and level of CP.

## 2020-01-14 NOTE — Progress Notes (Signed)
ANTICOAGULATION CONSULT NOTE - Initial Consult  Pharmacy Consult for Heparin  Indication: chest pain/ACS  No Known Allergies  Patient Measurements: Height: 5\' 2"  (157.5 cm) Weight: 176 lb 5.9 oz (80 kg) IBW/kg (Calculated) : 50.1 Heparin Dosing Weight: 69.7 kg   Vital Signs: Temp: 98.3 F (36.8 C) (02/15 0402) Temp Source: Oral (02/15 0402) BP: 130/86 (02/15 0402) Pulse Rate: 92 (02/15 0402)  Labs: Recent Labs    01/13/20 1517 01/13/20 1517 01/13/20 1638 01/13/20 1809 01/13/20 2304 01/14/20 0058 01/14/20 0613  HGB 15.0  --   --   --   --   --  14.3  HCT 43.3  --   --   --   --   --  41.7  PLT 190  --   --   --   --   --  168  APTT  --   --  29  --   --   --   --   LABPROT  --   --  12.6  --   --   --  12.8  INR  --   --  1.0  --   --   --  1.0  HEPARINUNFRC  --   --   --   --  <0.10*  --  0.19*  CREATININE 1.06*  --   --   --   --   --  0.97  TROPONINIHS 6,334*   < >  --  9,364* 11,713* 10,477*  --    < > = values in this interval not displayed.    Estimated Creatinine Clearance: 67.3 mL/min (by C-G formula based on SCr of 0.97 mg/dL).   Medical History: Past Medical History:  Diagnosis Date  . Cardiomyopathy (HCC)    a. 10/2017 Echo: EF 45-50% Gr1 DD, mild MR. Nl LA size. Nl RV fxn and PASP.  11/2017 Hypertension   . Tobacco abuse     Medications:  Medications Prior to Admission  Medication Sig Dispense Refill Last Dose  . lisinopril-hydrochlorothiazide (ZESTORETIC) 20-12.5 MG tablet Take 1.5 tablets by mouth daily.    01/13/2020 at 0800  . metoprolol succinate (TOPROL-XL) 50 MG 24 hr tablet Take 50 mg by mouth daily. Take with or immediately following a meal.   01/13/2020 at 0800    Assessment: Pharmacy consulted to dose heparin in this 54 year old female admitted with ACS/NSTEMI.  CrCl = 63.9 ml/min , no prior anticoag noted.   Heparin Course: 2/14 1707 Heparin initiated at 800 units/hr 2/14 2304 HL <0.10, bolus 2100 units and increase to 1000  units/hr 2/15 0613 HL 0.19  Goal of Therapy:  Heparin level 0.3-0.7 units/ml Monitor platelets by anticoagulation protocol: Yes   Plan:  2/15 0613 HL 0.19 is subtherapeutic. Will order Heparin bolus of 2100 units and increase rate to 1200 units/hr. Next HL in 6 hours at 1300. Daily CBC while on Heparin drip.  3/15, PharmD, BCPS 01/14/2020 7:10 AM

## 2020-01-14 NOTE — Progress Notes (Signed)
Dr. Kirke Corin at bedside, speaking with pt. And her spouse re: cath results. Both verbalize understanding of conversation.

## 2020-01-14 NOTE — Progress Notes (Signed)
*  PRELIMINARY RESULTS* Echocardiogram 2D Echocardiogram has been performed.  Joanette Gula Haislee Corso 01/14/2020, 10:00 AM

## 2020-01-14 NOTE — Progress Notes (Signed)
ANTICOAGULATION CONSULT NOTE - Initial Consult  Pharmacy Consult for Heparin  Indication: chest pain/ACS  No Known Allergies  Patient Measurements: Height: 5\' 2"  (157.5 cm) Weight: 180 lb 11.2 oz (82 kg) IBW/kg (Calculated) : 50.1 Heparin Dosing Weight: 69.7 kg   Vital Signs: Temp: 98 F (36.7 C) (02/14 2326) Temp Source: Oral (02/14 2105) BP: 114/79 (02/14 2141) Pulse Rate: 87 (02/14 2326)  Labs: Recent Labs    01/13/20 1517 01/13/20 1638 01/13/20 1809 01/13/20 2304  HGB 15.0  --   --   --   HCT 43.3  --   --   --   PLT 190  --   --   --   APTT  --  29  --   --   LABPROT  --  12.6  --   --   INR  --  1.0  --   --   HEPARINUNFRC  --   --   --  <0.10*  CREATININE 1.06*  --   --   --   TROPONINIHS 6,334*  --  9,364* 11,713*    Estimated Creatinine Clearance: 62.3 mL/min (A) (by C-G formula based on SCr of 1.06 mg/dL (H)).   Medical History: Past Medical History:  Diagnosis Date  . Cardiomyopathy (HCC)    a. 10/2017 Echo: EF 45-50% Gr1 DD, mild MR. Nl LA size. Nl RV fxn and PASP.  11/2017 Hypertension   . Tobacco abuse     Medications:  Medications Prior to Admission  Medication Sig Dispense Refill Last Dose  . lisinopril-hydrochlorothiazide (ZESTORETIC) 20-12.5 MG tablet Take 1.5 tablets by mouth daily.    01/13/2020 at 0800  . metoprolol succinate (TOPROL-XL) 50 MG 24 hr tablet Take 50 mg by mouth daily. Take with or immediately following a meal.   01/13/2020 at 0800    Assessment: Pharmacy consulted to dose heparin in this 54 year old female admitted with ACS/NSTEMI.  CrCl = 63.9 ml/min , no prior anticoag noted.   Heparin Course: 2/14 1707 Heparin initiated at 800 units/hr 2/14 2304 HL <0.10  Goal of Therapy:  Heparin level 0.3-0.7 units/ml Monitor platelets by anticoagulation protocol: Yes   Plan:  2/14 23004 HL <0.10 is subtherapeutic. Will order Heparin bolus of 2100 units and increase rate to 1000 units/hr. Next HL in 6 hours at 0600. Daily CBC while  on Heparin drip.  3/14, PharmD, BCPS 01/14/2020 12:08 AM

## 2020-01-14 NOTE — Progress Notes (Addendum)
ANTICOAGULATION CONSULT NOTE - Initial Consult  Pharmacy Consult for Heparin  Indication: chest pain/ACS  No Known Allergies  Patient Measurements: Height: 5\' 2"  (157.5 cm) Weight: 176 lb 5.9 oz (80 kg) IBW/kg (Calculated) : 50.1 Heparin Dosing Weight: 69.7 kg   Vital Signs: Temp: 98.6 F (37 C) (02/15 1456) Temp Source: Oral (02/15 1456) BP: 119/86 (02/15 1620) Pulse Rate: 97 (02/15 1630)  Labs: Recent Labs    01/13/20 1517 01/13/20 1517 01/13/20 1638 01/13/20 1809 01/13/20 2304 01/14/20 0058 01/14/20 0613 01/14/20 1415  HGB 15.0  --   --   --   --   --  14.3  --   HCT 43.3  --   --   --   --   --  41.7  --   PLT 190  --   --   --   --   --  168  --   APTT  --   --  29  --   --   --   --   --   LABPROT  --   --  12.6  --   --   --  12.8  --   INR  --   --  1.0  --   --   --  1.0  --   HEPARINUNFRC  --   --   --   --  <0.10*  --  0.19* 0.37  CREATININE 1.06*  --   --   --   --   --  0.97  --   TROPONINIHS 6,334*   < >  --  9,364* 11,713* 10,477*  --   --    < > = values in this interval not displayed.    Estimated Creatinine Clearance: 67.3 mL/min (by C-G formula based on SCr of 0.97 mg/dL).   Medical History: Past Medical History:  Diagnosis Date  . Cardiomyopathy (HCC)    a. 10/2017 Echo: EF 45-50% Gr1 DD, mild MR. Nl LA size. Nl RV fxn and PASP.  11/2017 Hypertension   . Tobacco abuse     Medications:  Medications Prior to Admission  Medication Sig Dispense Refill Last Dose  . lisinopril-hydrochlorothiazide (ZESTORETIC) 20-12.5 MG tablet Take 1.5 tablets by mouth daily.    01/13/2020 at 0800  . metoprolol succinate (TOPROL-XL) 50 MG 24 hr tablet Take 50 mg by mouth daily. Take with or immediately following a meal.   01/13/2020 at 0800    Assessment: Pharmacy consulted to dose heparin in this 54 year old female admitted with ACS/NSTEMI.  CrCl = 63.9 ml/min , no prior anticoag noted.   Heparin Course: 2/14 1707 Heparin initiated at 800 units/hr 2/14 2304 HL  <0.10, bolus 2100 units and increase to 1000 units/hr 2/15 0613 HL 0.19 2/15 1415 HL: 0.37 Level therapeutic x 1   Goal of Therapy:  Heparin level 0.3-0.7 units/ml Monitor platelets by anticoagulation protocol: Yes   Plan:  2/15 1415 HL: 0.37 Level therapeutic x 1.Heparin infusion rate of 1200 units/hr. Heparin infusion stopped @ 1455 when patient taken to Cath Lab.    Pharmacy consulted for heparin infusion restart 8 hours post sheath removal.   Heparin infusion ordered to start @ 0030 2/16.   Will order HL and CBC 6 hours after infusion restarted.   Continue to monitor HL and CBCs daily while on heparin infusion.   3/16, PharmD, BCPS Clinical Pharmacist 01/14/2020 4:50 PM

## 2020-01-14 NOTE — Progress Notes (Signed)
PROGRESS NOTE    Victoria Avery  JSE:[id number] DOB: 05-21-1966 DOA: 01/13/2020 PCP: Patient, No Pcp Per    Brief Narrative:   HPI: Victoria Avery is a 54 y.o. female with medical history significant of hypertension, tobacco abuse who presents for evaluation after acute onset of chest pressure x1 day.  Per the patient the pain started approximately 2 hours prior to presentation.  Patient does endorse some radiation to the back however denies any of the neck or down the arm.  No other associated symptoms.  Denies shortness of breath.  Patient's only cardiac history is hypertension however she does have a active smoking history.  Patient smokes 1 pack a day and has been doing so "forever".  On my evaluation patient is resting comfortably in bed.  She is in no visible distress.  She answers all questions appropriately.  She is alert and oriented x3.  Vital signs within normal limits.  Initial laboratory investigation positive for high-sensitivity troponin greater than 6000.  EKG demonstrates normal sinus rhythm with nonspecific ST-T wave changes.  Cardiology consulted from the emergency department and recommended initiation of heparin infusion, nitroglycerin, aspirin with plans to trend troponin and evaluate patient for cardiac catheterization.  2/15: Patient seen and examined.  Remains on heparin infusion.  Troponins peaked at near 12,000.  After review it appears the patient is established with [facility].  Consult incorrectly placed by ED provider.  I have contacted the appropriate cardiologist. Plan for cath today   Assessment & Plan:   Active Problems:   NSTEMI (non-ST elevated myocardial infarction) (HCC)  NSTEMI Patient with pressure-like chest pain 2 to 3 hours prior to admission Does have history of hypertension and tobacco abuse No known family history of coronary disease Troponins peaked at near 12,000 Plan: Consulted [facility], discussed with Dr. Kirke Corin Plan for  cath today Heparin GTT Metoprolol 25 twice daily Statin Aspirin N.p.o. until completion of catheterization Further recommendations following completion of cath  Hypertension Metoprolol tartrate 25 mg twice daily As needed labetalol  Tobacco abuse Counseled patient Offered nicotine patch, patient declined but is interested at time of discharge   DVT prophylaxis: Heparin GTT Code Status: Full code Family Communication: None today Disposition Plan: Home, anticipate discharge on 01/15/2020 after completion of catheterization.  No anticipated barriers   Consultants:   Cardiology-[facility]  Procedures:   Cardiac cath (01/14/2020)  Antimicrobials:  None   Subjective: Seen and examined Chest pain improved but still present No other complaints  Objective: Vitals:   01/14/20 1456 01/14/20 1620 01/14/20 1625 01/14/20 1630  BP:  119/86    Pulse: 76 93 87 97  Resp: 20 18 10 16   Temp: 98.6 F (37 C)     TempSrc: Oral     SpO2: 96% 93% 94% 93%  Weight: 80 kg     Height: 5\' 2"  (1.575 m)       Intake/Output Summary (Last 24 hours) at 01/14/2020 1643 Last data filed at 01/14/2020 1500 Gross per 24 hour  Intake 280.09 ml  Output 250 ml  Net 30.09 ml   Filed Weights   01/13/20 1745 01/14/20 0402 01/14/20 1456  Weight: 82 kg 80 kg 80 kg    Examination:  General exam: Appears calm and comfortable  Respiratory system: Clear to auscultation. Respiratory effort normal. Cardiovascular system: S1 & S2 heard, RRR. No JVD, murmurs, rubs, gallops or clicks. No pedal edema. Gastrointestinal system: Abdomen is nondistended, soft and nontender. No organomegaly or  masses felt. Normal bowel sounds heard. Central nervous system: Alert and oriented. No focal neurological deficits. Extremities: Symmetric 5 x 5 power. Skin: No rashes, lesions or ulcers Psychiatry: Judgement and insight appear normal. Mood & affect appropriate.     Data Reviewed: I have personally reviewed following  labs and imaging studies  CBC: Recent Labs  Lab 01/13/20 1517 01/14/20 0613  WBC 8.4 7.1  HGB 15.0 14.3  HCT 43.3 41.7  MCV 96.7 95.4  PLT 190 168   Basic Metabolic Panel: Recent Labs  Lab 01/13/20 1517 01/14/20 0613  NA 138 135  K 4.2 3.9  CL 104 102  CO2 26 24  GLUCOSE 122* 140*  BUN 23* 18  CREATININE 1.06* 0.97  CALCIUM 9.4 9.2   GFR: Estimated Creatinine Clearance: 67.3 mL/min (by C-G formula based on SCr of 0.97 mg/dL). Liver Function Tests: No results for input(s): AST, ALT, ALKPHOS, BILITOT, PROT, ALBUMIN in the last 168 hours. No results for input(s): LIPASE, AMYLASE in the last 168 hours. No results for input(s): AMMONIA in the last 168 hours. Coagulation Profile: Recent Labs  Lab 01/13/20 1638 01/14/20 0613  INR 1.0 1.0   Cardiac Enzymes: No results for input(s): CKTOTAL, CKMB, CKMBINDEX, TROPONINI in the last 168 hours. BNP (last 3 results) No results for input(s): PROBNP in the last 8760 hours. HbA1C: No results for input(s): HGBA1C in the last 72 hours. CBG: No results for input(s): GLUCAP in the last 168 hours. Lipid Profile: Recent Labs    01/14/20 1415  CHOL 197  HDL 38*  LDLCALC 112*  TRIG 234*  CHOLHDL 5.2   Thyroid Function Tests: No results for input(s): TSH, T4TOTAL, FREET4, T3FREE, THYROIDAB in the last 72 hours. Anemia Panel: No results for input(s): VITAMINB12, FOLATE, FERRITIN, TIBC, IRON, RETICCTPCT in the last 72 hours. Sepsis Labs: No results for input(s): PROCALCITON, LATICACIDVEN in the last 168 hours.  Recent Results (from the past 240 hour(s))  SARS CORONAVIRUS 2 (TAT 6-24 HRS) Nasopharyngeal Nasopharyngeal Swab     Status: None   Collection Time: 01/13/20  4:38 PM   Specimen: Nasopharyngeal Swab  Result Value Ref Range Status   SARS Coronavirus 2 NEGATIVE NEGATIVE Final    Comment: (NOTE) SARS-CoV-2 target nucleic acids are NOT DETECTED. The SARS-CoV-2 RNA is generally detectable in upper and lower respiratory  specimens during the acute phase of infection. Negative results do not preclude SARS-CoV-2 infection, do not rule out co-infections with other pathogens, and should not be used as the sole basis for treatment or other patient management decisions. Negative results must be combined with clinical observations, patient history, and epidemiological information. The expected result is Negative. Fact Sheet for Patients: HairSlick.no Fact Sheet for Healthcare Providers: quierodirigir.com This test is not yet approved or cleared by the [place] FDA and  has been authorized for detection and/or diagnosis of SARS-CoV-2 by FDA under an Emergency Use Authorization (EUA). This EUA will remain  in effect (meaning this test can be used) for the duration of the COVID-19 declaration under Section 56 4(b)(1) of the Act, 21 U.S.C. section 360bbb-3(b)(1), unless the authorization is terminated or revoked sooner. Performed at [facility], [address], [place], [place] [address]          Radiology Studies: DG Chest 2 View  Result Date: 01/13/2020 CLINICAL DATA:  Chest pain chest tightness mild shortness of breath EXAM: CHEST - 2 VIEW COMPARISON:  None. FINDINGS: The heart size and mediastinal contours are within normal limits.  Both lungs are clear. The visualized skeletal structures are unremarkable. IMPRESSION: No active cardiopulmonary disease. Electronically Signed   By: Donavan Foil M.D.   On: 01/13/2020 15:33   CARDIAC CATHETERIZATION  Result Date: 01/14/2020  There is mild left ventricular systolic dysfunction.  LV end diastolic pressure is normal.  The left ventricular ejection fraction is 45-50% by visual estimate.  Prox RCA lesion is 90% stenosed.  Prox RCA to Mid RCA lesion is 100% stenosed.  Mid Cx to Dist Cx lesion is 99% stenosed.  Mid LAD-1 lesion is 90% stenosed.  Mid LAD-2 lesion is 60% stenosed.  1st Diag-1  lesion is 85% stenosed.  1st Diag-2 lesion is 70% stenosed.  1.  Severe three-vessel coronary artery disease with chronic occlusion of the RCA with left-to-right collaterals, subtotal occlusion of the distal left circumflex supplying small OM branches and significant mid LAD stenosis and significant disease in ostial large first diagonal.  OM branches are likely too small to graft. 2.  Mildly reduced LV systolic function with an EF of 45 to 50% with inferior wall hypokinesis. 3.  Normal left ventricular end-diastolic pressure. Recommendations: I think the best option for complete revascularization is CABG.  The patient was not sure if she wanted to go through that but I decided to get her off the Cath Lab table to discuss this further.  PCI options including treating the LAD with PCI and leaving everything else to be treated medically.  However, that will not lead to complete revascularization.   ECHOCARDIOGRAM COMPLETE  Result Date: 01/14/2020    ECHOCARDIOGRAM REPORT   Patient Name:   PLEASANT BRITZ Date of Exam: 01/14/2020 Medical Rec #:  [id number]          Height:       62.0 in Accession #:    [id number]         Weight:       176.4 lb Date of Birth:  20-Sep-1966         BSA:          1.81 m Patient Age:    69 years           BP:           136/96 mmHg Patient Gender: F                  HR:           79 bpm. Exam Location:  [facility] Procedure: 2D Echo, Color Doppler and Cardiac Doppler Indications:     I21.4 NSTEMI  History:         Patient has prior history of Echocardiogram examinations.                  Cardiomyopathy; Risk Factors:Current Smoker and Hypertension.  Sonographer:     Charmayne Sheer RDCS (AE) Referring Phys:  [id number] Sidney Ace Diagnosing Phys: Kathlyn Sacramento MD  Sonographer Comments: Suboptimal parasternal window. IMPRESSIONS  1. Left ventricular ejection fraction, by estimation, is 55 to 60%. The left ventricle has normal function. The left ventricle has no regional wall motion  abnormalities. Left ventricular diastolic parameters were normal.  2. Right ventricular systolic function is normal. The right ventricular size is normal. Tricuspid regurgitation signal is inadequate for assessing PA pressure.  3. The mitral valve is normal in structure and function. No evidence of mitral valve regurgitation. No evidence of mitral stenosis.  4. The aortic valve is normal in structure and function. Aortic valve regurgitation  is not visualized. No aortic stenosis is present.  5. The inferior vena cava is normal in size with greater than 50% respiratory variability, suggesting right atrial pressure of 3 mmHg. FINDINGS  Left Ventricle: Left ventricular ejection fraction, by estimation, is 55 to 60%. The left ventricle has normal function. The left ventricle has no regional wall motion abnormalities. The left ventricular internal cavity size was normal in size. There is  no left ventricular hypertrophy. Left ventricular diastolic parameters were normal. Right Ventricle: The right ventricular size is normal. No increase in right ventricular wall thickness. Right ventricular systolic function is normal. Tricuspid regurgitation signal is inadequate for assessing PA pressure. Left Atrium: Left atrial size was normal in size. Right Atrium: Right atrial size was normal in size. Pericardium: There is no evidence of pericardial effusion. Mitral Valve: The mitral valve is normal in structure and function. Normal mobility of the mitral valve leaflets. No evidence of mitral valve regurgitation. No evidence of mitral valve stenosis. MV peak gradient, 5.0 mmHg. The mean mitral valve gradient is 2.0 mmHg. Tricuspid Valve: The tricuspid valve is normal in structure. Tricuspid valve regurgitation is not demonstrated. No evidence of tricuspid stenosis. Aortic Valve: The aortic valve is normal in structure and function. Aortic valve regurgitation is not visualized. No aortic stenosis is present. Aortic valve mean gradient  measures 5.0 mmHg. Aortic valve peak gradient measures 9.4 mmHg. Aortic valve area, by VTI measures 1.29 cm. Pulmonic Valve: The pulmonic valve was normal in structure. Pulmonic valve regurgitation is not visualized. No evidence of pulmonic stenosis. Aorta: The aortic root is normal in size and structure. Venous: The inferior vena cava is normal in size with greater than 50% respiratory variability, suggesting right atrial pressure of 3 mmHg. IAS/Shunts: No atrial level shunt detected by color flow Doppler.  LEFT VENTRICLE PLAX 2D LVIDd:         4.64 cm     Diastology LVIDs:         4.01 cm     LV e' lateral:   14.50 cm/s LV PW:         0.95 cm     LV E/e' lateral: 6.4 LV IVS:        0.76 cm     LV e' medial:    7.83 cm/s LVOT diam:     1.70 cm     LV E/e' medial:  11.9 LV SV:         35.86 ml LV SV Index:   15.19 LVOT Area:     2.27 cm  LV Volumes (MOD) LV vol d, MOD A2C: 91.6 ml LV vol d, MOD A4C: 82.0 ml LV vol s, MOD A2C: 37.2 ml LV vol s, MOD A4C: 38.5 ml LV SV MOD A2C:     54.4 ml LV SV MOD A4C:     82.0 ml LV SV MOD BP:      47.9 ml RIGHT VENTRICLE RV Basal diam:  3.27 cm LEFT ATRIUM             Index       RIGHT ATRIUM          Index LA diam:        3.30 cm 1.82 cm/m  RA Area:     9.50 cm LA Vol (A2C):   35.9 ml 19.81 ml/m RA Volume:   18.00 ml 9.93 ml/m LA Vol (A4C):   29.1 ml 16.06 ml/m LA Biplane Vol: 32.4 ml 17.88 ml/m  AORTIC VALVE  PULMONIC VALVE AV Area (Vmax):    1.24 cm     PV Vmax:       0.90 m/s AV Area (Vmean):   1.09 cm     PV Vmean:      67.000 cm/s AV Area (VTI):     1.29 cm     PV VTI:        0.169 m AV Vmax:           153.00 cm/s  PV Peak grad:  3.3 mmHg AV Vmean:          110.000 cm/s PV Mean grad:  2.0 mmHg AV VTI:            0.278 m AV Peak Grad:      9.4 mmHg AV Mean Grad:      5.0 mmHg LVOT Vmax:         83.50 cm/s LVOT Vmean:        52.900 cm/s LVOT VTI:          0.158 m LVOT/AV VTI ratio: 0.57  AORTA Ao Root diam: 2.60 cm MITRAL VALVE MV Area (PHT): 4.68  cm    SHUNTS MV Peak grad:  5.0 mmHg    Systemic VTI:  0.16 m MV Mean grad:  2.0 mmHg    Systemic Diam: 1.70 cm MV Vmax:       1.12 m/s MV Vmean:      68.8 cm/s MV Decel Time: 162 msec MV E velocity: 93.10 cm/s MV A velocity: 99.90 cm/s MV E/A ratio:  0.93 Lorine Bears MD Electronically signed by Lorine Bears MD Signature Date/Time: 01/14/2020/10:24:20 AM    Final         Scheduled Meds: . [MAR Hold] aspirin EC  81 mg Oral Daily  . [MAR Hold] atorvastatin  80 mg Oral q1800  . influenza vac split quadrivalent PF  0.5 mL Intramuscular Tomorrow-1000  . [MAR Hold] metoprolol tartrate  25 mg Oral BID  . [MAR Hold] sodium chloride flush  3 mL Intravenous Once  . sodium chloride flush  3 mL Intravenous Q12H   Continuous Infusions: . sodium chloride    . [START ON 01/15/2020] sodium chloride 10 mL/hr at 01/14/20 1500  . heparin Stopped (01/14/20 1455)     LOS: 1 day    Time spent: 35 minutes    Tresa Moore, MD [facility] Pager [phone]  If 7PM-7AM, please contact night-coverage  01/14/2020, 4:43 PM

## 2020-01-14 NOTE — Consult Note (Signed)
Cardiology Consultation:   Patient ID: Victoria Avery; [id number]; [birth year]   Admit date: 01/13/2020 Date of Consult: 01/14/2020  Primary Care Provider: Patient, No Pcp Per Primary Cardiologist: Rockey Situ   Patient Profile:   Victoria Avery is a 54 y.o. female with a hx of systolic dysfunction, HTN, and ongoing tobacco use at 1 pack/day for "a long time" who is being seen today for the evaluation of NSTEMI at the request of Dr. Priscella Mann.  History of Present Illness:   Victoria Avery was previous admitted to the hospital in 10/2017 with hypertensive urgency.  Echo at that time showed an EF of 45 to 30%, grade 1 diastolic dysfunction, mild mitral regurgitation, normal size left atrium, normal RV systolic function with normal PASP.  She declined ischemic evaluation.  She was last seen in the office in 01/2019 and was doing well.  She continued to smoke.  She was admitted to the hospital on 01/13/2020 with substernal chest pain that radiated to her back that morning.  Patient indicates for several days leading up to her admission she "did not feel good."  She is unable to elaborate on this any further.  Upon waking up on 2/14 she noted substernal chest discomfort though continued out to go eat breakfast.  Upon returning home she continued to "not feel good" and noted substernal chest pressure.  At its worst, her chest pressure was 7 out of 10.  There were no associated symptoms.  In this setting, she presented to the ED where her first high-sensitivity troponin was elevated at 6334 with a delta of 9364 and ultimately peaked at 11,713 with value currently downtrending.  BP initially in the 160F systolic with subsequent readings ranging from the low 093A to 355D systolic.  EKG showed inferolateral ST depression which was new when compared to prior study.  Chest x-ray showed no acute cardiopulmonary disease.  COVID-19 negative.  CBC unremarkable x2.  Serum creatinine 1.06 trending to 0.97.   Potassium 4.2 trending to 3.9.  In the ED, she was given 3 and 24 mg of aspirin followed by multiple rounds of sublingual nitro with subsequent improvement in her chest pain.  She was started on heparin drip.  This morning, she continues to note chest discomfort without radiation to the back which is rated a 2 out of 10.  She declines nicotine patches.  She is n.p.o.   Past Medical History:  Diagnosis Date  . Cardiomyopathy ([place])    a. 10/2017 Echo: EF 45-50% Gr1 DD, mild MR. Nl LA size. Nl RV fxn and PASP.  Marland Kitchen Hypertension   . Tobacco abuse     Past Surgical History:  Procedure Laterality Date  . CESAREAN SECTION       Home Meds: Prior to Admission medications   Medication Sig Start Date End Date Taking? Authorizing Provider  lisinopril-hydrochlorothiazide (ZESTORETIC) 20-12.5 MG tablet Take 1.5 tablets by mouth daily.    Yes [provider]  metoprolol succinate (TOPROL-XL) 50 MG 24 hr tablet Take 50 mg by mouth daily. Take with or immediately following a meal.   Yes [provider]    Inpatient Medications: Scheduled Meds: . aspirin EC  81 mg Oral Daily  . atorvastatin  80 mg Oral q1800  . influenza vac split quadrivalent PF  0.5 mL Intramuscular Tomorrow-1000  . metoprolol tartrate  25 mg Oral BID  . sodium chloride flush  3 mL Intravenous Once   Continuous Infusions: . heparin 1,200 Units/hr (01/14/20 0735)  PRN Meds: acetaminophen, albuterol, ALPRAZolam, labetalol, nitroGLYCERIN, ondansetron (ZOFRAN) IV  Allergies:  No Known Allergies  Social History:   Social History   Socioeconomic History  . Marital status: Married    Spouse name: Not on file  . Number of children: Not on file  . Years of education: Not on file  . Highest education level: Not on file  Occupational History  . Not on file  Tobacco Use  . Smoking status: Current Every Day Smoker    Packs/day: 1.00    Years: 30.00    Pack years: 30.00    Types: Cigarettes  . Smokeless  tobacco: Never Used  Substance and Sexual Activity  . Alcohol use: Yes    Comment: social   . Drug use: No  . Sexual activity: Not on file  Other Topics Concern  . Not on file  Social History Narrative  . Not on file   Social Determinants of Health   Financial Resource Strain:   . Difficulty of Paying Living Expenses: Not on file  Food Insecurity:   . Worried About Programme researcher, broadcasting/film/video in the Last Year: Not on file  . Ran Out of Food in the Last Year: Not on file  Transportation Needs:   . Lack of Transportation (Medical): Not on file  . Lack of Transportation (Non-Medical): Not on file  Physical Activity:   . Days of Exercise per Week: Not on file  . Minutes of Exercise per Session: Not on file  Stress:   . Feeling of Stress : Not on file  Social Connections:   . Frequency of Communication with Friends and Family: Not on file  . Frequency of Social Gatherings with Friends and Family: Not on file  . Attends Religious Services: Not on file  . Active Member of Clubs or Organizations: Not on file  . Attends Banker Meetings: Not on file  . Marital Status: Not on file  Intimate Partner Violence:   . Fear of Current or Ex-Partner: Not on file  . Emotionally Abused: Not on file  . Physically Abused: Not on file  . Sexually Abused: Not on file     Family History:   Family History  Problem Relation Age of Onset  . Hypertension Mother   . CAD Mother     ROS:  Review of Systems  Constitutional: Positive for malaise/fatigue. Negative for chills, diaphoresis, fever and weight loss.  HENT: Negative for congestion.   Eyes: Negative for discharge and redness.  Respiratory: Negative for cough, sputum production, shortness of breath and wheezing.   Cardiovascular: Positive for chest pain. Negative for palpitations, orthopnea, claudication, leg swelling and PND.  Gastrointestinal: Negative for abdominal pain, heartburn, nausea and vomiting.  Musculoskeletal: Positive  for back pain. Negative for falls and myalgias.  Skin: Negative for rash.  Neurological: Negative for dizziness, tingling, tremors, sensory change, speech change, focal weakness, loss of consciousness and weakness.  Endo/Heme/Allergies: Does not bruise/bleed easily.  Psychiatric/Behavioral: Negative for substance abuse. The patient is not nervous/anxious.   All other systems reviewed and are negative.     Physical Exam/Data:   Vitals:   01/13/20 2326 01/14/20 0108 01/14/20 0402 01/14/20 0805  BP:  104/74 130/86 (!) 136/96  Pulse: 87 81 92 87  Resp: 20  18 18   Temp: 98 F (36.7 C)  98.3 F (36.8 C) 98.7 F (37.1 C)  TempSrc: Oral  Oral Oral  SpO2: 99%  97% 97%  Weight:   80 kg  Height:        Intake/Output Summary (Last 24 hours) at 01/14/2020 1018 Last data filed at 01/14/2020 0434 Gross per 24 hour  Intake 51.19 ml  Output 250 ml  Net -198.81 ml   Filed Weights   01/13/20 1514 01/13/20 1745 01/14/20 0402  Weight: 86.2 kg 82 kg 80 kg   Body mass index is 32.26 kg/m.   Physical Exam: General: Well developed, well nourished, in no acute distress. Head: Normocephalic, atraumatic, sclera non-icteric, no xanthomas, nares without discharge.  Neck: Negative for carotid bruits. JVD not elevated. Lungs: Clear bilaterally to auscultation without wheezes, rales, or rhonchi. Breathing is unlabored. Heart: RRR with S1 S2. No murmurs, rubs, or gallops appreciated. Abdomen: Soft, non-tender, non-distended with normoactive bowel sounds. No hepatomegaly. No rebound/guarding. No obvious abdominal masses. Msk:  Strength and tone appear normal for age. Extremities: No clubbing or cyanosis. No edema. Distal pedal pulses are 2+ and equal bilaterally. Neuro: Alert and oriented X 3. No facial asymmetry. No focal deficit. Moves all extremities spontaneously. Psych:  Responds to questions appropriately with a normal affect.   EKG:  The EKG was personally reviewed and demonstrates: 01/13/2020,  15:09 - NSR, 80 bpm, inferolateral st/t depression which is new when compared to prior study. 01/13/2020, 21:09 - NSR, 72 bpm, inferolateral st depression. 01/14/2020, 09:09 - NSR, 76 bpm, TWI lead III, inferolateral st depression Telemetry:  Telemetry was personally reviewed and demonstrates: SR, occasional PVC, rare ventricular couplet   Weights: Filed Weights   01/13/20 1514 01/13/20 1745 01/14/20 0402  Weight: 86.2 kg 82 kg 80 kg    Relevant CV Studies: 2D echo 10/2017: - Left ventricle: The cavity size was normal. Systolic function was  mildly reduced. The estimated ejection fraction was in the range  of 45% to 50%. Regional wall motion abnormalities cannot be  excluded. Doppler parameters are consistent with abnormal left  ventricular relaxation (grade 1 diastolic dysfunction).  - Mitral valve: There was mild regurgitation.  - Left atrium: The atrium was normal in size.  - Right ventricle: Systolic function was normal.  - Pulmonary arteries: Systolic pressure was within the normal  range.   Laboratory Data:  Chemistry Recent Labs  Lab 01/13/20 1517 01/14/20 0613  NA 138 135  K 4.2 3.9  CL 104 102  CO2 26 24  GLUCOSE 122* 140*  BUN 23* 18  CREATININE 1.06* 0.97  CALCIUM 9.4 9.2  GFRNONAA >60 >60  GFRAA >60 >60  ANIONGAP 8 9    No results for input(s): PROT, ALBUMIN, AST, ALT, ALKPHOS, BILITOT in the last 168 hours. Hematology Recent Labs  Lab 01/13/20 1517 01/14/20 0613  WBC 8.4 7.1  RBC 4.48 4.37  HGB 15.0 14.3  HCT 43.3 41.7  MCV 96.7 95.4  MCH 33.5 32.7  MCHC 34.6 34.3  RDW 12.4 12.3  PLT 190 168   Cardiac EnzymesNo results for input(s): TROPONINI in the last 168 hours. No results for input(s): TROPIPOC in the last 168 hours.  BNPNo results for input(s): BNP, PROBNP in the last 168 hours.  DDimer No results for input(s): DDIMER in the last 168 hours.  Radiology/Studies:  DG Chest 2 View  Result Date: 01/13/2020 IMPRESSION: No active  cardiopulmonary disease. Electronically Signed   By: Jasmine Pang M.D.   On: 01/13/2020 15:33    Assessment and Plan:   1. NSTEMI: -Currently notes 2/10 chest pain -Initial high sensitivity troponin of 6334 with a delta of 9364 and peak of 62703, down trending  -  Initially, she noted some radiation of her chest pain to her back, though that has resolved, making aortopathy less likely at this time -ASA -Heparin gtt -Lopressor -Lipitor -SL NTG prn -IV morphine prn -NPO -LHC today -Echo pending -Risks and benefits of cardiac catheterization have been discussed with the patient including risks of bleeding, bruising, infection, kidney damage, stroke, heart attack, urgent need for bypass, injury to a limb, and death. The patient understands these risks and is willing to proceed with the procedure. All questions have been answered and concerns listened to  2.  Systolic dysfunction: -Prior EF 45-50% -She has previously declined ischemic evaluation -Plan for cardiac cath as above -Echo pending -Continue metoprolol with escalation of GDMT based on echo, LHC, vitals, and labs -CHF education   3. HTN: -Blood pressure mildly elevated -Continue metoprolol with escalation of therapy based on echo and LHC results  4. HLD: -LDL of 126 from 10/2018 with goal LDL now likely < 70 -Lipitor 80 mg  -Check lipid panel -Follow up FLP and ALT in ~ 8 weeks, if LDL remains above goal at that time, add Zetia 10 mg  5. Tobacco use: -She declines nicotine patches during admission, though is interested in them at time of discharge  -Complete cessation is advised   6. Hyperglycemia: -Check A1c   For questions or updates, please contact [facility] Please consult [email or web address] for contact info under Cardiology/STEMI.   Signed, Eula Listen, PA-C [facility] Pager: [phone] 01/14/2020, 10:18 AM

## 2020-01-14 NOTE — Progress Notes (Signed)
Hand off report given to Orlinda Blalock, RN . TR band deflation in progress: no complications noted at Right wrist site at present.

## 2020-01-15 ENCOUNTER — Encounter: Payer: Self-pay | Admitting: Cardiology

## 2020-01-15 DIAGNOSIS — R739 Hyperglycemia, unspecified: Secondary | ICD-10-CM

## 2020-01-15 DIAGNOSIS — I2510 Atherosclerotic heart disease of native coronary artery without angina pectoris: Secondary | ICD-10-CM

## 2020-01-15 DIAGNOSIS — I214 Non-ST elevation (NSTEMI) myocardial infarction: Secondary | ICD-10-CM | POA: Diagnosis not present

## 2020-01-15 DIAGNOSIS — I519 Heart disease, unspecified: Secondary | ICD-10-CM

## 2020-01-15 LAB — HEPARIN LEVEL (UNFRACTIONATED)
Heparin Unfractionated: 0.11 IU/mL — ABNORMAL LOW (ref 0.30–0.70)
Heparin Unfractionated: 0.19 IU/mL — ABNORMAL LOW (ref 0.30–0.70)
Heparin Unfractionated: 0.45 IU/mL (ref 0.30–0.70)

## 2020-01-15 LAB — CBC
HCT: 40.5 % (ref 36.0–46.0)
Hemoglobin: 13.8 g/dL (ref 12.0–15.0)
MCH: 32.8 pg (ref 26.0–34.0)
MCHC: 34.1 g/dL (ref 30.0–36.0)
MCV: 96.2 fL (ref 80.0–100.0)
Platelets: 165 10*3/uL (ref 150–400)
RBC: 4.21 MIL/uL (ref 3.87–5.11)
RDW: 12.3 % (ref 11.5–15.5)
WBC: 6.7 10*3/uL (ref 4.0–10.5)
nRBC: 0 % (ref 0.0–0.2)

## 2020-01-15 MED ORDER — HEPARIN BOLUS VIA INFUSION
2500.0000 [IU] | Freq: Once | INTRAVENOUS | Status: AC
  Administered 2020-01-15: 2500 [IU] via INTRAVENOUS
  Filled 2020-01-15: qty 2500

## 2020-01-15 MED ORDER — HEPARIN BOLUS VIA INFUSION
2300.0000 [IU] | Freq: Once | INTRAVENOUS | Status: AC
  Administered 2020-01-15: 2300 [IU] via INTRAVENOUS
  Filled 2020-01-15: qty 2300

## 2020-01-15 NOTE — Progress Notes (Signed)
ANTICOAGULATION CONSULT NOTE - Initial Consult  Pharmacy Consult for Heparin  Indication: chest pain/ACS  No Known Allergies  Patient Measurements: Height: 5\' 2"  (157.5 cm) Weight: 175 lb 9.6 oz (79.7 kg) IBW/kg (Calculated) : 50.1 Heparin Dosing Weight: 69.7 kg   Vital Signs: Temp: 98.7 F (37.1 C) (02/16 1624) Temp Source: Oral (02/16 1624) BP: 114/75 (02/16 1624) Pulse Rate: 76 (02/16 1624)  Labs: Recent Labs     0000 01/13/20 1517 01/13/20 1517 01/13/20 1638 01/13/20 1809 01/13/20 2304 01/13/20 2304 01/14/20 0058 01/14/20 01/16/20 01/14/20 0613 01/14/20 1415 01/15/20 0708 01/15/20 1445  HGB   < > 15.0  --   --   --   --   --   --  14.3  --   --  13.8  --   HCT  --  43.3  --   --   --   --   --   --  41.7  --   --  40.5  --   PLT  --  190  --   --   --   --   --   --  168  --   --  165  --   APTT  --   --   --  29  --   --   --   --   --   --   --   --   --   LABPROT  --   --   --  12.6  --   --   --   --  12.8  --   --   --   --   INR  --   --   --  1.0  --   --   --   --  1.0  --   --   --   --   HEPARINUNFRC  --   --   --   --   --  <0.10*   < >  --  0.19*   < > 0.37 0.11* 0.19*  CREATININE  --  1.06*  --   --   --   --   --   --  0.97  --   --   --   --   TROPONINIHS  --  6,334*   < >  --  9,364* 11,713*  --  10,477*  --   --   --   --   --    < > = values in this interval not displayed.    Estimated Creatinine Clearance: 67 mL/min (by C-G formula based on SCr of 0.97 mg/dL).   Medical History: Past Medical History:  Diagnosis Date  . Cardiomyopathy ([facility])    a. 10/2017 Echo: EF 45-50% Gr1 DD, mild MR. Nl LA size. Nl RV fxn and PASP.  11/2017 Hypertension   . Tobacco abuse     Medications:  Medications Prior to Admission  Medication Sig Dispense Refill Last Dose  . lisinopril-hydrochlorothiazide (ZESTORETIC) 20-12.5 MG tablet Take 1.5 tablets by mouth daily.    01/13/2020 at 0800  . metoprolol succinate (TOPROL-XL) 50 MG 24 hr tablet Take 50 mg by mouth  daily. Take with or immediately following a meal.   01/13/2020 at 0800    Assessment: Pharmacy consulted to dose heparin in this 54 year old female admitted with ACS/NSTEMI.  CrCl = 63.9 ml/min , no prior anticoag noted. S/p cath 2/15 - Cards recommend CABG.   Heparin Course: 2/14 1707  Heparin initiated at 800 units/hr 2/14 2304 HL <0.10, bolus 2100 units and increase to 1000 units/hr 2/15 0613 HL 0.19 2/15 1415 HL: 0.37 Level therapeutic x 1  2/16 0708 HL : 0.11  2/16 1445 HL: 0.19  Goal of Therapy:  Heparin level 0.3-0.7 units/ml Monitor platelets by anticoagulation protocol: Yes   Plan:  Heparin level subtherapeutic. Will rebolus with 2500 units of heparin and increase rate to 1600 units/hr.  Heparin level to be order 6 hours from rate change. Daily CBC. CBC stable.   Pearla Dubonnet, PharmD Clinical Pharmacist 01/15/2020 4:27 PM

## 2020-01-15 NOTE — Discharge Summary (Signed)
Physician Discharge Summary  Victoria Avery CYE:[id number] DOB: 12-16-1965 DOA: 01/13/2020  PCP: Patient, No Pcp Per  Admit date: 01/13/2020 Discharge date: 01/15/2020  Admitted From: Home Disposition: Transfer to [facility]  Recommendations for Outpatient Follow-up:  1. Follow up with PCP in 1-2 weeks 2. Follow-up with cardiology as directed  Home Health: No Equipment/Devices: None Discharge Condition: Stable CODE STATUS: Full Diet recommendation: Heart Healthy  Brief/Interim Summary: Victoria A Davenportis a 54 y.o.femalewith medical history significant ofhypertension, tobacco abuse who presents for evaluation after acute onset of chest pressure x1 day. Per the patient the pain started approximately 2 hours prior to presentation. Patient does endorse some radiation to the back however denies any of the neck or down the arm. No other associated symptoms. Denies shortness of breath. Patient's only cardiac history is hypertension however she does have a active smoking history. Patient smokes 1 pack a day and has been doing so "forever".  On my evaluation patient is resting comfortably in bed. She is in no visible distress. She answers all questions appropriately. She is alert and oriented x3. Vital signs within normal limits. Initial laboratory investigation positive for high-sensitivity troponin greater than 6000. EKG demonstrates normal sinus rhythm with nonspecific ST-T wave changes. Cardiology consulted from the emergency department and recommended initiation of heparin infusion, nitroglycerin, aspirin with plans to trend troponin and evaluate patient for cardiac catheterization.  2/15: Patient seen and examined.  Remains on heparin infusion.  Troponins peaked at near 12,000.  After review it appears the patient is established with [facility] cardiology group.  Consult incorrectly placed by ED provider.  I have contacted the appropriate cardiologist. Plan for  cath today  2/16: Patient seen and examined.  Reviewed results from cardiac cath.  Patient with multivessel disease not amenable to PCI.  Discussed with cardiology PA Ryan done.  Plan to transfer patient to [facility] for CABG evaluation.  Per Eula Listen, primary care will be assumed by the cardiology service at [facility].  Patient will not dispo to [facility].  Discharge Diagnoses:  Active Problems:   NSTEMI (non-ST elevated myocardial infarction) (HCC)   NSTEMI Severe multivessel coronary artery disease Systolic dysfunction, EF 45 to 50% Patient with pressure-like chest pain 2 to 3 hours prior to admission Does have history of hypertension and tobacco abuse No known family history of coronary disease Troponins peaked at near 12,000 Status post cardiac cath with Dr. Kirke Corin Multivessel disease noted, not amenable to PCI Cardiology discussed with patient, agreeable to transfer to [facility] for cardiothoracic surgery evaluation for CABG Primary care to be assumed by the cardiology service at [facility]. Continue aspirin, heparin drip, Lopressor, Lipitor, sublingual nitroglycerin as needed, IV morphine as needed. No Plavix at this time.  Hypertension Metoprolol tartrate 25 mg twice daily As needed labetalol  Tobacco abuse Counseled patient Offered nicotine patch, patient declined but is interested at time of discharge  Discharge Instructions  Discharge Instructions    Diet - low sodium heart healthy   Complete by: As directed    Increase activity slowly   Complete by: As directed        No Known Allergies  Consultations: Cardiology-[facility]  Procedures/Studies: DG Chest 2 View  Result Date: 01/13/2020 CLINICAL DATA:  Chest pain chest tightness mild shortness of breath EXAM: CHEST - 2 VIEW COMPARISON:  None. FINDINGS: The heart size and mediastinal contours are within normal limits. Both lungs are clear. The visualized skeletal  structures  are unremarkable. IMPRESSION: No active cardiopulmonary disease. Electronically Signed   By: Jasmine Pang M.D.   On: 01/13/2020 15:33   CARDIAC CATHETERIZATION  Result Date: 01/14/2020  There is mild left ventricular systolic dysfunction.  LV end diastolic pressure is normal.  The left ventricular ejection fraction is 45-50% by visual estimate.  Prox RCA lesion is 90% stenosed.  Prox RCA to Mid RCA lesion is 100% stenosed.  Mid Cx to Dist Cx lesion is 99% stenosed.  Mid LAD-1 lesion is 90% stenosed.  Mid LAD-2 lesion is 60% stenosed.  1st Diag-1 lesion is 85% stenosed.  1st Diag-2 lesion is 70% stenosed.  1.  Severe three-vessel coronary artery disease with chronic occlusion of the RCA with left-to-right collaterals, subtotal occlusion of the distal left circumflex supplying small OM branches and significant mid LAD stenosis and significant disease in ostial large first diagonal.  OM branches are likely too small to graft. 2.  Mildly reduced LV systolic function with an EF of 45 to 50% with inferior wall hypokinesis. 3.  Normal left ventricular end-diastolic pressure. Recommendations: I think the best option for complete revascularization is CABG.  The patient was not sure if she wanted to go through that but I decided to get her off the Cath Lab table to discuss this further.  PCI options including treating the LAD with PCI and leaving everything else to be treated medically.  However, that will not lead to complete revascularization.   ECHOCARDIOGRAM COMPLETE  Result Date: 01/14/2020    ECHOCARDIOGRAM REPORT   Patient Name:   Victoria Avery Date of Exam: 01/14/2020 Medical Rec #:  [id number]          Height:       62.0 in Accession #:    [id number]         Weight:       176.4 lb Date of Birth:  09-29-1966         BSA:          1.81 m Patient Age:    54 years           BP:           136/96 mmHg Patient Gender: F                  HR:           79 bpm. Exam Location:  [facility] Procedure:  2D Echo, Color Doppler and Cardiac Doppler Indications:     I21.4 NSTEMI  History:         Patient has prior history of Echocardiogram examinations.                  Cardiomyopathy; Risk Factors:Current Smoker and Hypertension.  Sonographer:     Humphrey Rolls RDCS (AE) Referring Phys:  [id number] Tresa Moore Diagnosing Phys: Lorine Bears MD  Sonographer Comments: Suboptimal parasternal window. IMPRESSIONS  1. Left ventricular ejection fraction, by estimation, is 50 to 55%. The left ventricle has low normal function. LV endocardial border not optimally defined to evaluate regional wall motion. Left ventricular diastolic parameters were normal.  2. Right ventricular systolic function is normal. The right ventricular size is normal. Tricuspid regurgitation signal is inadequate for assessing PA pressure.  3. The mitral valve is normal in structure and function. Trivial mitral valve regurgitation. No evidence of mitral stenosis.  4. The aortic valve is normal in structure and function. Aortic valve regurgitation is not visualized. No aortic stenosis is present.  5. The inferior vena cava is normal in size with greater than 50% respiratory variability, suggesting right atrial pressure of 3 mmHg. FINDINGS  Left Ventricle: Left ventricular ejection fraction, by estimation, is 50 to 55%. The left ventricle has low normal function. LV endocardial border not optimally defined to evaluate regional wall motion. The left ventricular internal cavity size was normal in size. There is no left ventricular hypertrophy. Left ventricular diastolic parameters were normal. Right Ventricle: The right ventricular size is normal. No increase in right ventricular wall thickness. Right ventricular systolic function is normal. Tricuspid regurgitation signal is inadequate for assessing PA pressure. Left Atrium: Left atrial size was normal in size. Right Atrium: Right atrial size was normal in size. Pericardium: There is no evidence of  pericardial effusion. Mitral Valve: The mitral valve is normal in structure and function. Normal mobility of the mitral valve leaflets. Trivial mitral valve regurgitation. No evidence of mitral valve stenosis. MV peak gradient, 5.0 mmHg. The mean mitral valve gradient is 2.0 mmHg. Tricuspid Valve: The tricuspid valve is normal in structure. Tricuspid valve regurgitation is not demonstrated. No evidence of tricuspid stenosis. Aortic Valve: The aortic valve is normal in structure and function. Aortic valve regurgitation is not visualized. No aortic stenosis is present. Aortic valve mean gradient measures 5.0 mmHg. Aortic valve peak gradient measures 9.4 mmHg. Aortic valve area, by VTI measures 1.29 cm. Pulmonic Valve: The pulmonic valve was normal in structure. Pulmonic valve regurgitation is not visualized. No evidence of pulmonic stenosis. Aorta: The aortic root is normal in size and structure. Venous: The inferior vena cava is normal in size with greater than 50% respiratory variability, suggesting right atrial pressure of 3 mmHg. IAS/Shunts: No atrial level shunt detected by color flow Doppler.  LEFT VENTRICLE PLAX 2D LVIDd:         4.64 cm     Diastology LVIDs:         4.01 cm     LV e' lateral:   14.50 cm/s LV PW:         0.95 cm     LV E/e' lateral: 6.4 LV IVS:        0.76 cm     LV e' medial:    7.83 cm/s LVOT diam:     1.70 cm     LV E/e' medial:  11.9 LV SV:         35.86 ml LV SV Index:   15.19 LVOT Area:     2.27 cm  LV Volumes (MOD) LV vol d, MOD A2C: 91.6 ml LV vol d, MOD A4C: 82.0 ml LV vol s, MOD A2C: 37.2 ml LV vol s, MOD A4C: 38.5 ml LV SV MOD A2C:     54.4 ml LV SV MOD A4C:     82.0 ml LV SV MOD BP:      47.9 ml RIGHT VENTRICLE RV Basal diam:  3.27 cm LEFT ATRIUM             Index       RIGHT ATRIUM          Index LA diam:        3.30 cm 1.82 cm/m  RA Area:     9.50 cm LA Vol (A2C):   35.9 ml 19.81 ml/m RA Volume:   18.00 ml 9.93 ml/m LA Vol (A4C):   29.1 ml 16.06 ml/m LA Biplane Vol: 32.4 ml  17.88 ml/m  AORTIC VALVE  PULMONIC VALVE AV Area (Vmax):    1.24 cm     PV Vmax:       0.90 m/s AV Area (Vmean):   1.09 cm     PV Vmean:      67.000 cm/s AV Area (VTI):     1.29 cm     PV VTI:        0.169 m AV Vmax:           153.00 cm/s  PV Peak grad:  3.3 mmHg AV Vmean:          110.000 cm/s PV Mean grad:  2.0 mmHg AV VTI:            0.278 m AV Peak Grad:      9.4 mmHg AV Mean Grad:      5.0 mmHg LVOT Vmax:         83.50 cm/s LVOT Vmean:        52.900 cm/s LVOT VTI:          0.158 m LVOT/AV VTI ratio: 0.57  AORTA Ao Root diam: 2.60 cm MITRAL VALVE MV Area (PHT): 4.68 cm    SHUNTS MV Peak grad:  5.0 mmHg    Systemic VTI:  0.16 m MV Mean grad:  2.0 mmHg    Systemic Diam: 1.70 cm MV Vmax:       1.12 m/s MV Vmean:      68.8 cm/s MV Decel Time: 162 msec MV E velocity: 93.10 cm/s MV A velocity: 99.90 cm/s MV E/A ratio:  0.93 Lorine Bears MD Electronically signed by Lorine Bears MD Signature Date/Time: 01/14/2020/10:24:20 AM    Final (Updated)     (Echo, Carotid, EGD, Colonoscopy, ERCP)    Subjective: Seen and examined on the day of discharge No acute overnight events No new complaints Agreeable to transfer to [facility]  Discharge Exam: Vitals:   01/15/20 0230 01/15/20 0719  BP: 102/72 132/89  Pulse: 79 75  Resp: 18 16  Temp: 98.1 F (36.7 C) 98.7 F (37.1 C)  SpO2:  97%   Vitals:   01/14/20 1946 01/15/20 0230 01/15/20 0447 01/15/20 0719  BP: (!) 100/52 102/72  132/89  Pulse: 90 79  75  Resp: 18 18  16   Temp: 98.7 F (37.1 C) 98.1 F (36.7 C)  98.7 F (37.1 C)  TempSrc: Oral Oral  Oral  SpO2: 97%   97%  Weight:   79.7 kg   Height:        General: Pt is alert, awake, not in acute distress Cardiovascular: RRR, S1/S2 +, no rubs, no gallops Respiratory: CTA bilaterally, no wheezing, no rhonchi Abdominal: Soft, NT, ND, bowel sounds + Extremities: no edema, no cyanosis    The results of significant diagnostics from this hospitalization (including  imaging, microbiology, ancillary and laboratory) are listed below for reference.     Microbiology: Recent Results (from the past 240 hour(s))  SARS CORONAVIRUS 2 (TAT 6-24 HRS) Nasopharyngeal Nasopharyngeal Swab     Status: None   Collection Time: 01/13/20  4:38 PM   Specimen: Nasopharyngeal Swab  Result Value Ref Range Status   SARS Coronavirus 2 NEGATIVE NEGATIVE Final    Comment: (NOTE) SARS-CoV-2 target nucleic acids are NOT DETECTED. The SARS-CoV-2 RNA is generally detectable in upper and lower respiratory specimens during the acute phase of infection. Negative results do not preclude SARS-CoV-2 infection, do not rule out co-infections with other pathogens, and should not be used as the sole basis for treatment  or other patient management decisions. Negative results must be combined with clinical observations, patient history, and epidemiological information. The expected result is Negative. Fact Sheet for Patients: HairSlick.nohttps://www.fda.gov/media/138098/download Fact Sheet for Healthcare Providers: quierodirigir.comhttps://www.fda.gov/media/138095/download This test is not yet approved or cleared by the [place] FDA and  has been authorized for detection and/or diagnosis of SARS-CoV-2 by FDA under an Emergency Use Authorization (EUA). This EUA will remain  in effect (meaning this test can be used) for the duration of the COVID-19 declaration under Section 56 4(b)(1) of the Act, 21 U.S.C. section 360bbb-3(b)(1), unless the authorization is terminated or revoked sooner. Performed at [facility], [address] [place], [place] [address]      Labs: BNP (last 3 results) No results for input(s): BNP in the last 8760 hours. Basic Metabolic Panel: Recent Labs  Lab 01/13/20 1517 01/14/20 0613  NA 138 135  K 4.2 3.9  CL 104 102  CO2 26 24  GLUCOSE 122* 140*  BUN 23* 18  CREATININE 1.06* 0.97  CALCIUM 9.4 9.2   Liver Function Tests: No results for input(s): AST, ALT, ALKPHOS,  BILITOT, PROT, ALBUMIN in the last 168 hours. No results for input(s): LIPASE, AMYLASE in the last 168 hours. No results for input(s): AMMONIA in the last 168 hours. CBC: Recent Labs  Lab 01/13/20 1517 01/14/20 0613 01/15/20 0708  WBC 8.4 7.1 6.7  HGB 15.0 14.3 13.8  HCT 43.3 41.7 40.5  MCV 96.7 95.4 96.2  PLT 190 168 165   Cardiac Enzymes: No results for input(s): CKTOTAL, CKMB, CKMBINDEX, TROPONINI in the last 168 hours. BNP: Invalid input(s): POCBNP CBG: No results for input(s): GLUCAP in the last 168 hours. D-Dimer No results for input(s): DDIMER in the last 72 hours. Hgb A1c Recent Labs    01/14/20 1415  HGBA1C 5.7*   Lipid Profile Recent Labs    01/14/20 1415  CHOL 197  HDL 38*  LDLCALC 112*  TRIG 234*  CHOLHDL 5.2   Thyroid function studies No results for input(s): TSH, T4TOTAL, T3FREE, THYROIDAB in the last 72 hours.  Invalid input(s): FREET3 Anemia work up No results for input(s): VITAMINB12, FOLATE, FERRITIN, TIBC, IRON, RETICCTPCT in the last 72 hours. Urinalysis    Component Value Date/Time   COLORURINE YELLOW (A) 11/25/2017 1638   APPEARANCEUR CLEAR (A) 11/25/2017 1638   LABSPEC 1.010 11/25/2017 1638   PHURINE 6.0 11/25/2017 1638   GLUCOSEU NEGATIVE 11/25/2017 1638   HGBUR MODERATE (A) 11/25/2017 1638   BILIRUBINUR NEGATIVE 11/25/2017 1638   KETONESUR 5 (A) 11/25/2017 1638   PROTEINUR NEGATIVE 11/25/2017 1638   NITRITE NEGATIVE 11/25/2017 1638   LEUKOCYTESUR NEGATIVE 11/25/2017 1638   Sepsis Labs Invalid input(s): PROCALCITONIN,  WBC,  LACTICIDVEN Microbiology Recent Results (from the past 240 hour(s))  SARS CORONAVIRUS 2 (TAT 6-24 HRS) Nasopharyngeal Nasopharyngeal Swab     Status: None   Collection Time: 01/13/20  4:38 PM   Specimen: Nasopharyngeal Swab  Result Value Ref Range Status   SARS Coronavirus 2 NEGATIVE NEGATIVE Final    Comment: (NOTE) SARS-CoV-2 target nucleic acids are NOT DETECTED. The SARS-CoV-2 RNA is generally  detectable in upper and lower respiratory specimens during the acute phase of infection. Negative results do not preclude SARS-CoV-2 infection, do not rule out co-infections with other pathogens, and should not be used as the sole basis for treatment or other patient management decisions. Negative results must be combined with clinical observations, patient history, and epidemiological information. The expected result is Negative. Fact  Sheet for Patients: HairSlick.no Fact Sheet for Healthcare Providers: quierodirigir.com This test is not yet approved or cleared by the [place] FDA and  has been authorized for detection and/or diagnosis of SARS-CoV-2 by FDA under an Emergency Use Authorization (EUA). This EUA will remain  in effect (meaning this test can be used) for the duration of the COVID-19 declaration under Section 56 4(b)(1) of the Act, 21 U.S.C. section 360bbb-3(b)(1), unless the authorization is terminated or revoked sooner. Performed at [facility], [address], [place], [place] [address]      Time coordinating discharge: Over 30 minutes  SIGNED:   Tresa Moore, MD  [facility] 01/15/2020, 9:57 AM Pager   If 7PM-7AM, please contact night-coverage

## 2020-01-15 NOTE — Progress Notes (Signed)
ANTICOAGULATION CONSULT NOTE -  Pharmacy Consult for Heparin  Indication: chest pain/ACS  No Known Allergies  Patient Measurements: Height: 5\' 2"  (157.5 cm) Weight: 175 lb 9.6 oz (79.7 kg) IBW/kg (Calculated) : 50.1 Heparin Dosing Weight: 69.7 kg   Vital Signs: Temp: 97.7 F (36.5 C) (02/16 2037) Temp Source: Oral (02/16 2037) BP: 134/95 (02/16 2037) Pulse Rate: 94 (02/16 2037)  Labs: Recent Labs     0000 01/13/20 1517 01/13/20 1517 01/13/20 1638 01/13/20 1809 01/13/20 2304 01/13/20 2304 01/14/20 0058 01/14/20 1610 01/14/20 1415 01/15/20 0708 01/15/20 1445 01/15/20 2151  HGB   < > 15.0  --   --   --   --   --   --  14.3  --  13.8  --   --   HCT  --  43.3  --   --   --   --   --   --  41.7  --  40.5  --   --   PLT  --  190  --   --   --   --   --   --  168  --  165  --   --   APTT  --   --   --  29  --   --   --   --   --   --   --   --   --   LABPROT  --   --   --  12.6  --   --   --   --  12.8  --   --   --   --   INR  --   --   --  1.0  --   --   --   --  1.0  --   --   --   --   HEPARINUNFRC  --   --   --   --   --  <0.10*   < >  --  0.19*   < > 0.11* 0.19* 0.45  CREATININE  --  1.06*  --   --   --   --   --   --  0.97  --   --   --   --   TROPONINIHS  --  6,334*   < >  --  9,364* 11,713*  --  10,477*  --   --   --   --   --    < > = values in this interval not displayed.    Estimated Creatinine Clearance: 67 mL/min (by C-G formula based on SCr of 0.97 mg/dL).   Medical History: Past Medical History:  Diagnosis Date  . Cardiomyopathy ([place])    a. 10/2017 Echo: EF 45-50% Gr1 DD, mild MR. Nl LA size. Nl RV fxn and PASP.  Marland Kitchen Hypertension   . Tobacco abuse     Medications:  Medications Prior to Admission  Medication Sig Dispense Refill Last Dose  . lisinopril-hydrochlorothiazide (ZESTORETIC) 20-12.5 MG tablet Take 1.5 tablets by mouth daily.    01/13/2020 at 0800  . metoprolol succinate (TOPROL-XL) 50 MG 24 hr tablet Take 50 mg by mouth daily. Take with or  immediately following a meal.   01/13/2020 at 0800    Assessment: Pharmacy consulted to dose heparin in this 54 year old female admitted with ACS/NSTEMI.  CrCl = 63.9 ml/min , no prior anticoag noted. S/p cath 2/15 - Cards recommend CABG.   Heparin Course: 2/14 1707 Heparin initiated  at 800 units/hr 2/14 2304 HL <0.10, bolus 2100 units and increase to 1000 units/hr 2/15 0613 HL 0.19 2/15 1415 HL: 0.37 Level therapeutic x 1  2/16 0708 HL : 0.11  2/16 1445 HL: 0.19 2/16 2151 HL: 0.45  Goal of Therapy:  Heparin level 0.3-0.7 units/ml Monitor platelets by anticoagulation protocol: Yes   Plan:  Heparin level therapeutic x 1. Will current rate of 1600 units/hr.  Recheck Heparin level in am to confirm. Daily CBC per protocol.  Wayland Denis, PharmD Clinical Pharmacist 01/15/2020 10:56 PM

## 2020-01-15 NOTE — H&P (Addendum)
Cardiology Admission History and Physical:   Patient ID: Victoria Avery MRN: [id number]; DOB: August 20, 1966   Admission date: (Not on file)  Primary Care Provider: Patient, No Pcp Per Primary Cardiologist: Julien Nordmann, MD  Primary Electrophysiologist:  None   Chief Complaint:  Chest pain  Patient Profile:   Victoria Avery is a 54 y.o. female with systolic dysfunction, HTN, and ongoing tobacco use at 1 pack/day for "a long time" who was admitted to [facility] on 11/11/2020 with a non-STEMI and found to have severe multivessel CAD on cath with recommendation to transfer to [facility] for consideration of CABG.  History of Present Illness:   Victoria Avery was previous admitted to the hospital in 10/2017 with hypertensive urgency.  Echo at that time showed an EF of 45 to 50%, grade 1 diastolic dysfunction, mild mitral regurgitation, normal size left atrium, normal RV systolic function with normal PASP.  She declined ischemic evaluation.  She was last seen in the office in 01/2019 and was doing well.  She continued to smoke.   She was admitted to [facility] on 01/13/2020 with substernal chest pain that radiated to her back that morning.  Patient indicated for several days leading up to her admission she "did not feel good."  She was unable to elaborate on this any further.  Upon waking up on 2/14 she noted substernal chest discomfort though continued out to go eat breakfast.  Upon returning home, she continued to "not feel good" and noted substernal chest pressure.  At its worst, her chest pressure was 7 out of 10.  There were no associated symptoms.  In this setting, she presented to the ED where her first high-sensitivity troponin was elevated at 6334 with a delta of 9364 and ultimately peaked at 11,713 with value subsequent downtrending.  BP initially in the 160s mmHg systolic with subsequent readings ranging from the low 100s to 130s systolic.  EKG showed inferolateral ST depression which was new when  compared to prior study.  Chest x-ray showed no acute cardiopulmonary disease.  COVID-19 negative.  CBC unremarkable x2.  Serum creatinine 1.06 trending to 0.97.  Potassium 4.2 trending to 3.9.  In the ED, she was given 324 mg of aspirin followed by multiple rounds of sublingual nitro with subsequent improvement in her chest pain.  She was started on heparin drip.  On the morning of 2/15, at time of cardiology consult, this morning, she continued to note chest discomfort without radiation to the back which was rated a 2/10.  She declined nicotine patches.  Echo on 2/15 showed an EF of 50 to 55%, normal LV diastolic function, normal RV systolic function and cavity size, trivial mitral regurgitation.  LHC on 01/14/2020 showed severe three-vessel CAD with CTO of the RCA with left-to-right collaterals, subtotal occlusion of the distal LCx supplying small OM branches, and significant mid LAD stenosis with significant disease in a large, ostial D1.  The OM branches were felt to be likely too small to graft.  LV gram showed an EF of 45 to 50% with inferior wall hypokinesis.  Normal LVEDP.  It was felt the best option for complete revascularization was CABG.  Initially, patient was uncertain if she wanted to proceed with this however after she has had an evening to think about this and on rounds on 2/16 she is now agreeable to transfer to [facility] for CABG and states "it needs to be done."  She has been restarted on heparin drip and continued on aspirin.  Patient did not receive Plavix/Brilinta/Effient.  On the morning of 2/16, she continued to note chest "soreness" rated at a 1/10.  Heart Pathway Score:  HEAR Score: 5  Past Medical History:  Diagnosis Date  . Cardiomyopathy (HCC)    a. 10/2017 Echo: EF 45-50% Gr1 DD, mild MR. Nl LA size. Nl RV fxn and PASP.  Marland Kitchen Hypertension   . Tobacco abuse     Past Surgical History:  Procedure Laterality Date  . CESAREAN SECTION    . LEFT HEART CATH AND CORONARY ANGIOGRAPHY N/A  01/14/2020   Procedure: LEFT HEART CATH AND CORONARY ANGIOGRAPHY;  Surgeon: Iran Ouch, MD;  Location: [facility] INVASIVE CV LAB;  Service: Cardiovascular;  Laterality: N/A;     Medications Prior to Admission: Prior to Admission medications   Medication Sig Start Date End Date Taking? Authorizing Provider  lisinopril-hydrochlorothiazide (ZESTORETIC) 20-12.5 MG tablet Take 1.5 tablets by mouth daily.     [provider]  metoprolol succinate (TOPROL-XL) 50 MG 24 hr tablet Take 50 mg by mouth daily. Take with or immediately following a meal.    [provider]     Allergies:   No Known Allergies  Social History:   Social History   Socioeconomic History  . Marital status: Married    Spouse name: Not on file  . Number of children: Not on file  . Years of education: Not on file  . Highest education level: Not on file  Occupational History  . Not on file  Tobacco Use  . Smoking status: Current Every Day Smoker    Packs/day: 1.00    Years: 30.00    Pack years: 30.00    Types: Cigarettes  . Smokeless tobacco: Never Used  Substance and Sexual Activity  . Alcohol use: Yes    Comment: social   . Drug use: No  . Sexual activity: Not on file  Other Topics Concern  . Not on file  Social History Narrative  . Not on file   Social Determinants of Health   Financial Resource Strain:   . Difficulty of Paying Living Expenses: Not on file  Food Insecurity:   . Worried About Programme researcher, broadcasting/film/video in the Last Year: Not on file  . Ran Out of Food in the Last Year: Not on file  Transportation Needs:   . Lack of Transportation (Medical): Not on file  . Lack of Transportation (Non-Medical): Not on file  Physical Activity:   . Days of Exercise per Week: Not on file  . Minutes of Exercise per Session: Not on file  Stress:   . Feeling of Stress : Not on file  Social Connections:   . Frequency of Communication with Friends and Family: Not on file  . Frequency of Social  Gatherings with Friends and Family: Not on file  . Attends Religious Services: Not on file  . Active Member of Clubs or Organizations: Not on file  . Attends Banker Meetings: Not on file  . Marital Status: Not on file  Intimate Partner Violence:   . Fear of Current or Ex-Partner: Not on file  . Emotionally Abused: Not on file  . Physically Abused: Not on file  . Sexually Abused: Not on file    Family History:   The patient's family history includes CAD in her mother; Hypertension in her mother.    ROS:  Please see the history of present illness.  All other ROS reviewed and negative.  Physical Exam/Data:   Vitals:   01/14/20 1946 01/15/20 0230 01/15/20 0447 01/15/20 0719  BP: (!) 100/52 102/72  132/89  Pulse: 90 79  75  Resp: 18 18  16   Temp: 98.7 F (37.1 C) 98.1 F (36.7 C)  98.7 F (37.1 C)  TempSrc: Oral Oral  Oral  SpO2: 97%   97%  Weight:   79.7 kg   Height:        Intake/Output Summary (Last 24 hours) at 01/15/2020 0819 Last data filed at 01/15/2020 0027    Gross per 24 hour  Intake 251.4 ml  Output 1400 ml  Net -1148.6 ml        Filed Weights   01/14/20 0402 01/14/20 1456 01/15/20 0447  Weight: 80 kg 80 kg 79.7 kg     General:  Well nourished, well developed, in no acute distress HEENT: Normal Lymph: No adenopathy Neck: No JVD Endocrine:  No thryomegaly Vascular: No carotid bruits; FA pulses 2+ bilaterally without bruits  Cardiac:  Normal S1, S2; RRR; no murmur.  Right radial cardiac cath site is without bleeding, bruising, swelling, warmth, erythema, or tenderness to palpation; radial pulse 2+ Lungs:  Clear to auscultation bilaterally, no wheezing, rhonchi or rales  Abd: Soft, nontender, no hepatomegaly  Ext: No edema Musculoskeletal:  No deformities, BUE and BLE strength normal and equal Skin: warm and dry  Neuro:  CNs 2-12 intact, no focal abnormalities noted Psych:  Normal affect    EKG:  The ECG was  personally reviewed and demonstrates 15:09 - NSR, 80 bpm, inferolateral st/t depression which is new when compared to prior study. 01/13/2020, 21:09 - NSR, 72 bpm, inferolateral st depression. 01/14/2020, 09:09 - NSR, 76 bpm, TWI lead III, inferolateral st depression  Relevant CV Studies: 2D echo 01/14/2020: 1. Left ventricular ejection fraction, by estimation, is 50 to 55%. The  left ventricle has low normal function. LV endocardial border not  optimally defined to evaluate regional wall motion. Left ventricular  diastolic parameters were normal.  2. Right ventricular systolic function is normal. The right ventricular  size is normal. Tricuspid regurgitation signal is inadequate for assessing  PA pressure.  3. The mitral valve is normal in structure and function. Trivial mitral  valve regurgitation. No evidence of mitral stenosis.  4. The aortic valve is normal in structure and function. Aortic valve  regurgitation is not visualized. No aortic stenosis is present.  5. The inferior vena cava is normal in size with greater than 50%  respiratory variability, suggesting right atrial pressure of 3 mmHg.  _______________  [facility] 01/14/2020:  There is mild left ventricular systolic dysfunction.  LV end diastolic pressure is normal.  The left ventricular ejection fraction is 45-50% by visual estimate.  Prox RCA lesion is 90% stenosed.  Prox RCA to Mid RCA lesion is 100% stenosed.  Mid Cx to Dist Cx lesion is 99% stenosed.  Mid LAD-1 lesion is 90% stenosed.  Mid LAD-2 lesion is 60% stenosed.  1st Diag-1 lesion is 85% stenosed.  1st Diag-2 lesion is 70% stenosed.  1. Severe three-vessel coronary artery disease with chronic occlusion of the RCA with left-to-right collaterals, subtotal occlusion of the distal left circumflex supplying small OM branches and significant mid LAD stenosis and significant disease in ostial large first diagonal. OM branches are likely too small to graft. 2.  Mildly reduced LV systolic function with an EF of 45 to 50% with inferior wall hypokinesis. 3. Normal left ventricular end-diastolic pressure.  Recommendations: I think  the best option for complete revascularization is CABG. The patient was not sure if she wanted to go through that but I decided to get her off the Cath Lab table to discuss this further. PCI options including treating the LAD with PCI and leaving everything else to be treated medically. However, that will not lead to complete revascularization. _______________  Diagnostic Dominance: Right Left Main  Vessel is angiographically normal.  Left Anterior Descending  Mid LAD-1 lesion 90% stenosed  Mid LAD-1 lesion is 90% stenosed.  Mid LAD-2 lesion 60% stenosed  Mid LAD-2 lesion is 60% stenosed.  First Diagonal Branch  Vessel is large in size.  1st Diag-1 lesion 85% stenosed  1st Diag-1 lesion is 85% stenosed.  1st Diag-2 lesion 70% stenosed  1st Diag-2 lesion is 70% stenosed.  Left Circumflex  Mid Cx to Dist Cx lesion 99% stenosed  Mid Cx to Dist Cx lesion is 99% stenosed.  First Obtuse Marginal Branch  Vessel is small in size.  Second Obtuse Marginal Branch  Vessel is small in size.  Third Obtuse Marginal Branch  Vessel is small in size.  Right Coronary Artery  Prox RCA lesion 90% stenosed  Prox RCA lesion is 90% stenosed.  Prox RCA to Mid RCA lesion 100% stenosed  Prox RCA to Mid RCA lesion is 100% stenosed.  Right Posterior Descending Artery  Collaterals  RPDA filled by collaterals from Dist LAD.    Third Right Posterolateral Branch  Collaterals  3rd RPL filled by collaterals from 1st Sept.    Intervention  No interventions have been documented. Wall Motion  Resting               Left Heart  Left Ventricle The left ventricular size is normal. There is mild left ventricular systolic dysfunction. LV end diastolic pressure is normal. The left ventricular ejection fraction is 45-50% by visual  estimate. There are LV function abnormalities due to segmental dysfunction.  Coronary Diagrams  Diagnostic Dominance: Right   _____________  Laboratory Data:  High Sensitivity Troponin:   Recent Labs  Lab 01/13/20 1517 01/13/20 1809 01/13/20 2304 01/14/20 0058  TROPONINIHS 6,334* 9,364* 11,713* 10,477*      Chemistry Recent Labs  Lab 01/13/20 1517 01/14/20 0613  NA 138 135  K 4.2 3.9  CL 104 102  CO2 26 24  GLUCOSE 122* 140*  BUN 23* 18  CREATININE 1.06* 0.97  CALCIUM 9.4 9.2  GFRNONAA >60 >60  GFRAA >60 >60  ANIONGAP 8 9    No results for input(s): PROT, ALBUMIN, AST, ALT, ALKPHOS, BILITOT in the last 168 hours. Hematology Recent Labs  Lab 01/14/20 0613 01/15/20 0708  WBC 7.1 6.7  RBC 4.37 4.21  HGB 14.3 13.8  HCT 41.7 40.5  MCV 95.4 96.2  MCH 32.7 32.8  MCHC 34.3 34.1  RDW 12.3 12.3  PLT 168 165   BNPNo results for input(s): BNP, PROBNP in the last 168 hours.  DDimer No results for input(s): DDIMER in the last 168 hours.   Radiology/Studies:        TIMI Risk Score for Unstable Angina or Non-ST Elevation MI:   The patient's TIMI risk score is 5, which indicates a 26% risk of all cause mortality, new or recurrent myocardial infarction or need for urgent revascularization in the next 14 days.   Assessment and Plan:   1.  NSTEMI: -Currently notes 1/10 chest pain -Initial high sensitivity troponin of6334with a delta of 9364and [place] 95638, down trending  -LHC  on 01/14/2020 showed severe three-vessel CAD with CTO of the RCA with left-to-right collaterals, subtotal occlusion of the distal LCx supplying small OM branches and significant mid LAD stenosis as well as significant disease in a large ostial D1.  The OM branches were felt to be too small to graft.  At time of cath her best option for complete revascularization was felt to be CABG.  Initially, patient was uncertain if she wanted to move forward with this.  After she has had time to think  about this she is now agreeable to proceed with CABG and transfer to [facility].  This will be coordinated with primary service at [facility], [facility], clinical support staff, and our team in [place] -ASA -Heparin gtt -Lopressor -Lipitor -SL NTG prn -IV morphine prn -Post-cath instructions  2.Systolic dysfunction: -Prior EF 45-50% with echo this admission showing an EF of 50 to 55% with EF by LV gram of 45 to 50% with inferior wall hypokinesis -She previously declined ischemic evaluation at time of her initially noted cardiomyopathy in 2018 -LHC as outlined above -Continue metoprolol with escalation of GDMT as vitals and labs allow  -CHF education   3. HTN: -Blood pressure reasonably controlled -Continue metoprolol  -Low-sodium diet  4. HLD: -LDL of 126 from 10/2018 with goal LDL < 70 -Started on Lipitor 80 mg this admission -LDL 112 this admission -Follow up FLP and ALT in ~ 8 weeks, if LDL remains above goal at that time, add Zetia 10 mg  5. Tobacco use: -She declines nicotine patches during admission, though is interested in them at time of discharge  -Complete cessation is advised   6. Hyperglycemia: -A1c 5.7 -Follow-up with PCP as outpatient  Severity of Illness: The appropriate patient status for this patient is INPATIENT. Inpatient status is judged to be reasonable and necessary in order to provide the required intensity of service to ensure the patient's safety. The patient's presenting symptoms, physical exam findings, and initial radiographic and laboratory data in the context of their chronic comorbidities is felt to place them at high risk for further clinical deterioration. Furthermore, it is not anticipated that the patient will be medically stable for discharge from the hospital within 2 midnights of admission. The following factors support the patient status of inpatient.   " The patient's presenting symptoms include NSTEMI. " The worrisome physical exam  findings include ongoing chest discomfort. " The initial radiographic and laboratory data are worrisome because of abnormal EKG, elevated troponin, LHC with severe multivessel CAD. " The chronic co-morbidities include ongoing tobacco use, hypertension, hyperlipidemia, BMI greater than 30.   * I certify that at the point of admission it is my clinical judgment that the patient will require inpatient hospital care spanning beyond 2 midnights from the point of admission due to high intensity of service, high risk for further deterioration and high frequency of surveillance required.*    For questions or updates, please contact [facility] Please consult [email or web address] for contact info under        Signed, Eula Listen, PA-C  01/15/2020 10:08 AM

## 2020-01-15 NOTE — Progress Notes (Signed)
Progress Note  Patient Name: Victoria Avery Date of Encounter: 01/15/2020  Primary Cardiologist: Mariah Milling  Subjective   Continues to note some substernal chest "soreness."  Pain is rated 1 out of 10.  No dyspnea, palpitations, or dizziness.  LHC on 2/15 showed severe three-vessel CAD as outlined below with recommendation to proceed with CABG.  As patient has had time to think about this overnight she is now willing to proceed with transfer to [facility] for CABG.  She remains on heparin drip and ASA.  She has not received any Plavix this admission.  Inpatient Medications    Scheduled Meds: . aspirin EC  81 mg Oral Daily  . atorvastatin  80 mg Oral q1800  . heparin  2,300 Units Intravenous Once  . influenza vac split quadrivalent PF  0.5 mL Intramuscular Tomorrow-1000  . metoprolol tartrate  25 mg Oral BID  . sodium chloride flush  3 mL Intravenous Once  . sodium chloride flush  3 mL Intravenous Q12H   Continuous Infusions: . sodium chloride    . heparin 1,200 Units/hr (01/15/20 0056)   PRN Meds: sodium chloride, acetaminophen, albuterol, ALPRAZolam, labetalol, morphine injection, nitroGLYCERIN, ondansetron (ZOFRAN) IV, sodium chloride flush   Vital Signs    Vitals:   01/14/20 1946 01/15/20 0230 01/15/20 0447 01/15/20 0719  BP: (!) 100/52 102/72  132/89  Pulse: 90 79  75  Resp: 18 18  16   Temp: 98.7 F (37.1 C) 98.1 F (36.7 C)  98.7 F (37.1 C)  TempSrc: Oral Oral  Oral  SpO2: 97%   97%  Weight:   79.7 kg   Height:        Intake/Output Summary (Last 24 hours) at 01/15/2020 0819 Last data filed at 01/15/2020 0027 Gross per 24 hour  Intake 251.4 ml  Output 1400 ml  Net -1148.6 ml   Filed Weights   01/14/20 0402 01/14/20 1456 01/15/20 0447  Weight: 80 kg 80 kg 79.7 kg    Telemetry    Sinus rhythm- Personally Reviewed  ECG    No new tracings- Personally Reviewed  Physical Exam   GEN: No acute distress.   Neck: No JVD. Cardiac: RRR, no murmurs,  rubs, or gallops.  Right radial cath site with minimal ecchymosis.  No bleeding, swelling, warmth, erythema, or tenderness to palpation at the right radial cath site.  Right radial pulse 2+. Respiratory: Clear to auscultation bilaterally.  GI: Soft, nontender, non-distended.   MS: No edema; No deformity. Neuro:  Alert and oriented x 3; Nonfocal.  Psych: Normal affect.  Labs    Chemistry Recent Labs  Lab 01/13/20 1517 01/14/20 0613  NA 138 135  K 4.2 3.9  CL 104 102  CO2 26 24  GLUCOSE 122* 140*  BUN 23* 18  CREATININE 1.06* 0.97  CALCIUM 9.4 9.2  GFRNONAA >60 >60  GFRAA >60 >60  ANIONGAP 8 9     Hematology Recent Labs  Lab 01/13/20 1517 01/14/20 0613 01/15/20 0708  WBC 8.4 7.1 6.7  RBC 4.48 4.37 4.21  HGB 15.0 14.3 13.8  HCT 43.3 41.7 40.5  MCV 96.7 95.4 96.2  MCH 33.5 32.7 32.8  MCHC 34.6 34.3 34.1  RDW 12.4 12.3 12.3  PLT 190 168 165    Cardiac EnzymesNo results for input(s): TROPONINI in the last 168 hours. No results for input(s): TROPIPOC in the last 168 hours.   BNPNo results for input(s): BNP, PROBNP in the last 168 hours.   DDimer No results  for input(s): DDIMER in the last 168 hours.   Radiology    DG Chest 2 View  Result Date: 01/13/2020 IMPRESSION: No active cardiopulmonary disease. Electronically Signed   By: Donavan Foil M.D.   On: 01/13/2020 15:33    Cardiac Studies   2D echo 01/14/2020: 1. Left ventricular ejection fraction, by estimation, is 50 to 55%. The  left ventricle has low normal function. LV endocardial border not  optimally defined to evaluate regional wall motion. Left ventricular  diastolic parameters were normal.  2. Right ventricular systolic function is normal. The right ventricular  size is normal. Tricuspid regurgitation signal is inadequate for assessing  PA pressure.  3. The mitral valve is normal in structure and function. Trivial mitral  valve regurgitation. No evidence of mitral stenosis.  4. The aortic valve  is normal in structure and function. Aortic valve  regurgitation is not visualized. No aortic stenosis is present.  5. The inferior vena cava is normal in size with greater than 50%  respiratory variability, suggesting right atrial pressure of 3 mmHg.  _______________  [facility] 01/14/2020:  There is mild left ventricular systolic dysfunction.  LV end diastolic pressure is normal.  The left ventricular ejection fraction is 45-50% by visual estimate.  Prox RCA lesion is 90% stenosed.  Prox RCA to Mid RCA lesion is 100% stenosed.  Mid Cx to Dist Cx lesion is 99% stenosed.  Mid LAD-1 lesion is 90% stenosed.  Mid LAD-2 lesion is 60% stenosed.  1st Diag-1 lesion is 85% stenosed.  1st Diag-2 lesion is 70% stenosed.   1.  Severe three-vessel coronary artery disease with chronic occlusion of the RCA with left-to-right collaterals, subtotal occlusion of the distal left circumflex supplying small OM branches and significant mid LAD stenosis and significant disease in ostial large first diagonal.  OM branches are likely too small to graft. 2.  Mildly reduced LV systolic function with an EF of 45 to 50% with inferior wall hypokinesis. 3.  Normal left ventricular end-diastolic pressure.  Recommendations: I think the best option for complete revascularization is CABG.  The patient was not sure if she wanted to go through that but I decided to get her off the Cath Lab table to discuss this further.  PCI options including treating the LAD with PCI and leaving everything else to be treated medically.  However, that will not lead to complete revascularization.  Patient Profile     54 y.o. female with history of systolic dysfunction, HTN, and ongoing tobacco use at 1 pack/day for "a long time" who is being seen today for the evaluation of NSTEMI at the request of Dr. Priscella Mann.  Assessment & Plan    1. NSTEMI: -Currently notes 1/10 chest pain -Initial high sensitivity troponin of 6334 with a delta  of 9364 and [place] 34287, down trending  -LHC on 01/14/2020 showed severe three-vessel CAD with CTO of the RCA with left-to-right collaterals, subtotal occlusion of the distal LCx supplying small OM branches and significant mid LAD stenosis as well as significant disease in a large ostial D1.  The OM branches were felt to be too small to graft.  At time of cath her best option for complete revascularization was felt to be CABG.  Initially, patient was uncertain if she wanted to move forward with this.  After she has had time to think about this she is now agreeable to proceed with CABG and transfer to [facility].  This will be coordinated with our team in  [place] -ASA -Heparin gtt -Lopressor -Lipitor -SL NTG prn -IV morphine prn -LHC today -Post-cath instructions  2.  Systolic dysfunction: -Prior EF 45-50% with echo this admission showing an EF of 50 to 55% with EF by LV gram of 45 to 50% with inferior wall hypokinesis -She previously declined ischemic evaluation at time of her initially noted cardiomyopathy in 2018 -LHC as outlined above -Continue metoprolol with escalation of GDMT as vitals and labs allow  -CHF education   3. HTN: -Blood pressure reasonably controlled -Continue metoprolol  -Low-sodium diet  4. HLD: -LDL of 126 from 10/2018 with goal LDL < 70 -Started on Lipitor 80 mg this admission -LDL 112 this admission -Follow up FLP and ALT in ~ 8 weeks, if LDL remains above goal at that time, add Zetia 10 mg  5. Tobacco use: -She declines nicotine patches during admission, though is interested in them at time of discharge  -Complete cessation is advised   6. Hyperglycemia: -A1c 5.7 -Follow-up with PCP as outpatient   For questions or updates, please contact [facility] Please consult [email or web address] for contact info under Cardiology/STEMI.    Signed, Eula Listen, PA-C [facility] Pager: [phone] 01/15/2020, 8:19 AM

## 2020-01-15 NOTE — Progress Notes (Signed)
ANTICOAGULATION CONSULT NOTE - Initial Consult  Pharmacy Consult for Heparin  Indication: chest pain/ACS  No Known Allergies  Patient Measurements: Height: 5\' 2"  (157.5 cm) Weight: 175 lb 9.6 oz (79.7 kg) IBW/kg (Calculated) : 50.1 Heparin Dosing Weight: 69.7 kg   Vital Signs: Temp: 98.7 F (37.1 C) (02/16 0719) Temp Source: Oral (02/16 0719) BP: 132/89 (02/16 0719) Pulse Rate: 75 (02/16 0719)  Labs: Recent Labs     0000 01/13/20 1517 01/13/20 1517 01/13/20 1638 01/13/20 1809 01/13/20 2304 01/13/20 2304 01/14/20 0058 01/14/20 0613 01/14/20 1415 01/15/20 0708  HGB   < > 15.0  --   --   --   --   --   --  14.3  --  13.8  HCT  --  43.3  --   --   --   --   --   --  41.7  --  40.5  PLT  --  190  --   --   --   --   --   --  168  --  165  APTT  --   --   --  29  --   --   --   --   --   --   --   LABPROT  --   --   --  12.6  --   --   --   --  12.8  --   --   INR  --   --   --  1.0  --   --   --   --  1.0  --   --   HEPARINUNFRC  --   --   --   --   --  <0.10*   < >  --  0.19* 0.37 0.11*  CREATININE  --  1.06*  --   --   --   --   --   --  0.97  --   --   TROPONINIHS  --  6,334*   < >  --  9,364* 11,713*  --  10,477*  --   --   --    < > = values in this interval not displayed.    Estimated Creatinine Clearance: 67 mL/min (by C-G formula based on SCr of 0.97 mg/dL).   Medical History: Past Medical History:  Diagnosis Date  . Cardiomyopathy (HCC)    a. 10/2017 Echo: EF 45-50% Gr1 DD, mild MR. Nl LA size. Nl RV fxn and PASP.  11/2017 Hypertension   . Tobacco abuse     Medications:  Medications Prior to Admission  Medication Sig Dispense Refill Last Dose  . lisinopril-hydrochlorothiazide (ZESTORETIC) 20-12.5 MG tablet Take 1.5 tablets by mouth daily.    01/13/2020 at 0800  . metoprolol succinate (TOPROL-XL) 50 MG 24 hr tablet Take 50 mg by mouth daily. Take with or immediately following a meal.   01/13/2020 at 0800    Assessment: Pharmacy consulted to dose heparin in  this 54 year old female admitted with ACS/NSTEMI.  CrCl = 63.9 ml/min , no prior anticoag noted. S/p cath 2/15 - Cards recommend CABG.   Heparin Course: 2/14 1707 Heparin initiated at 800 units/hr 2/14 2304 HL <0.10, bolus 2100 units and increase to 1000 units/hr 2/15 0613 HL 0.19 2/15 1415 HL: 0.37 Level therapeutic x 1  2/16 0708 HL : 0.11   Goal of Therapy:  Heparin level 0.3-0.7 units/ml Monitor platelets by anticoagulation protocol: Yes   Plan:  Heparin leve  subtherapeutic. Will rebolus with 2300 units of heparin and increase rate to 1400 units/hr.  Heparin level to be order 6 hours from rate change. Daily CBC. CBC stable.   Oswald Hillock, PharmD, BCPS Clinical Pharmacist 01/15/2020 8:11 AM

## 2020-01-16 ENCOUNTER — Encounter: Payer: Self-pay | Admitting: Internal Medicine

## 2020-01-16 ENCOUNTER — Inpatient Hospital Stay (HOSPITAL_COMMUNITY)
Admission: AD | Admit: 2020-01-16 | Discharge: 2020-01-21 | DRG: 236 | Disposition: A | Discharge status: Home / Self Care | Payer: BC Managed Care – PPO | Source: Other Acute Inpatient Hospital | Attending: Thoracic Surgery (Cardiothoracic Vascular Surgery) | Admitting: Thoracic Surgery (Cardiothoracic Vascular Surgery)

## 2020-01-16 ENCOUNTER — Other Ambulatory Visit: Payer: Self-pay

## 2020-01-16 DIAGNOSIS — I25118 Atherosclerotic heart disease of native coronary artery with other forms of angina pectoris: Secondary | ICD-10-CM | POA: Diagnosis present

## 2020-01-16 DIAGNOSIS — Z20822 Contact with and (suspected) exposure to covid-19: Secondary | ICD-10-CM | POA: Diagnosis not present

## 2020-01-16 DIAGNOSIS — I10 Essential (primary) hypertension: Secondary | ICD-10-CM | POA: Diagnosis present

## 2020-01-16 DIAGNOSIS — Z951 Presence of aortocoronary bypass graft: Secondary | ICD-10-CM

## 2020-01-16 DIAGNOSIS — J9811 Atelectasis: Secondary | ICD-10-CM

## 2020-01-16 DIAGNOSIS — I25110 Atherosclerotic heart disease of native coronary artery with unstable angina pectoris: Secondary | ICD-10-CM | POA: Diagnosis not present

## 2020-01-16 DIAGNOSIS — I519 Heart disease, unspecified: Secondary | ICD-10-CM

## 2020-01-16 DIAGNOSIS — R000 Tachycardia, unspecified: Secondary | ICD-10-CM | POA: Diagnosis not present

## 2020-01-16 DIAGNOSIS — Z79899 Other long term (current) drug therapy: Secondary | ICD-10-CM | POA: Diagnosis not present

## 2020-01-16 DIAGNOSIS — J90 Pleural effusion, not elsewhere classified: Secondary | ICD-10-CM | POA: Diagnosis not present

## 2020-01-16 DIAGNOSIS — D62 Acute posthemorrhagic anemia: Secondary | ICD-10-CM | POA: Diagnosis not present

## 2020-01-16 DIAGNOSIS — F17210 Nicotine dependence, cigarettes, uncomplicated: Secondary | ICD-10-CM | POA: Diagnosis not present

## 2020-01-16 DIAGNOSIS — Z09 Encounter for follow-up examination after completed treatment for conditions other than malignant neoplasm: Secondary | ICD-10-CM

## 2020-01-16 DIAGNOSIS — I2510 Atherosclerotic heart disease of native coronary artery without angina pectoris: Secondary | ICD-10-CM

## 2020-01-16 DIAGNOSIS — Z8249 Family history of ischemic heart disease and other diseases of the circulatory system: Secondary | ICD-10-CM

## 2020-01-16 DIAGNOSIS — Z95818 Presence of other cardiac implants and grafts: Secondary | ICD-10-CM | POA: Diagnosis not present

## 2020-01-16 DIAGNOSIS — I517 Cardiomegaly: Secondary | ICD-10-CM | POA: Diagnosis not present

## 2020-01-16 DIAGNOSIS — R739 Hyperglycemia, unspecified: Secondary | ICD-10-CM | POA: Diagnosis not present

## 2020-01-16 DIAGNOSIS — I110 Hypertensive heart disease with heart failure: Secondary | ICD-10-CM | POA: Diagnosis present

## 2020-01-16 DIAGNOSIS — I429 Cardiomyopathy, unspecified: Secondary | ICD-10-CM | POA: Diagnosis not present

## 2020-01-16 DIAGNOSIS — I214 Non-ST elevation (NSTEMI) myocardial infarction: Principal | ICD-10-CM | POA: Diagnosis present

## 2020-01-16 DIAGNOSIS — E785 Hyperlipidemia, unspecified: Secondary | ICD-10-CM

## 2020-01-16 DIAGNOSIS — Z01810 Encounter for preprocedural cardiovascular examination: Secondary | ICD-10-CM | POA: Diagnosis not present

## 2020-01-16 DIAGNOSIS — I2584 Coronary atherosclerosis due to calcified coronary lesion: Secondary | ICD-10-CM | POA: Diagnosis present

## 2020-01-16 DIAGNOSIS — I5022 Chronic systolic (congestive) heart failure: Secondary | ICD-10-CM | POA: Diagnosis present

## 2020-01-16 DIAGNOSIS — I361 Nonrheumatic tricuspid (valve) insufficiency: Secondary | ICD-10-CM | POA: Diagnosis not present

## 2020-01-16 DIAGNOSIS — F17200 Nicotine dependence, unspecified, uncomplicated: Secondary | ICD-10-CM | POA: Diagnosis present

## 2020-01-16 DIAGNOSIS — I252 Old myocardial infarction: Secondary | ICD-10-CM

## 2020-01-16 LAB — CBC
HCT: 38.6 % (ref 36.0–46.0)
Hemoglobin: 13.3 g/dL (ref 12.0–15.0)
MCH: 33 pg (ref 26.0–34.0)
MCHC: 34.5 g/dL (ref 30.0–36.0)
MCV: 95.8 fL (ref 80.0–100.0)
Platelets: 176 10*3/uL (ref 150–400)
RBC: 4.03 MIL/uL (ref 3.87–5.11)
RDW: 12.2 % (ref 11.5–15.5)
WBC: 6.1 10*3/uL (ref 4.0–10.5)
nRBC: 0 % (ref 0.0–0.2)

## 2020-01-16 LAB — HEPARIN LEVEL (UNFRACTIONATED): Heparin Unfractionated: 0.36 IU/mL (ref 0.30–0.70)

## 2020-01-16 MED ORDER — ALBUTEROL SULFATE (2.5 MG/3ML) 0.083% IN NEBU: 3.0000 mL | INHALATION_SOLUTION | Freq: Four times a day (QID) | RESPIRATORY_TRACT | Status: DC | PRN

## 2020-01-16 MED ORDER — ALPRAZOLAM 0.25 MG PO TABS
0.2500 mg | ORAL_TABLET | Freq: Three times a day (TID) | ORAL | Status: DC | PRN
  Administered 2020-01-17: 23:00:00 0.25 mg via ORAL
  Filled 2020-01-16: qty 1

## 2020-01-16 MED ORDER — ATORVASTATIN CALCIUM 80 MG PO TABS
80.0000 mg | ORAL_TABLET | Freq: Every day | ORAL | Status: DC
  Administered 2020-01-16 – 2020-01-17 (×2): 80 mg via ORAL
  Filled 2020-01-16 (×2): qty 1

## 2020-01-16 MED ORDER — ACETAMINOPHEN 325 MG PO TABS
650.0000 mg | ORAL_TABLET | ORAL | Status: DC | PRN
  Administered 2020-01-16: 650 mg via ORAL
  Filled 2020-01-16: qty 2

## 2020-01-16 MED ORDER — ONDANSETRON HCL 4 MG/2ML IJ SOLN: 4.0000 mg | Freq: Four times a day (QID) | INTRAMUSCULAR | Status: DC | PRN

## 2020-01-16 MED ORDER — LABETALOL HCL 5 MG/ML IV SOLN: 10.0000 mg | INTRAVENOUS | Status: DC | PRN

## 2020-01-16 MED ORDER — NITROGLYCERIN 0.4 MG SL SUBL
0.4000 mg | SUBLINGUAL_TABLET | SUBLINGUAL | Status: DC | PRN
  Administered 2020-01-16: 0.4 mg via SUBLINGUAL
  Filled 2020-01-16: qty 1

## 2020-01-16 MED ORDER — MORPHINE SULFATE (PF) 2 MG/ML IV SOLN: 2.0000 mg | INTRAVENOUS | Status: DC | PRN

## 2020-01-16 MED ORDER — HEPARIN (PORCINE) 25000 UT/250ML-% IV SOLN
1600.0000 [IU]/h | INTRAVENOUS | Status: DC
  Administered 2020-01-17 (×2): 1600 [IU]/h via INTRAVENOUS
  Filled 2020-01-16 (×2): qty 250

## 2020-01-16 MED ORDER — METOPROLOL TARTRATE 25 MG PO TABS
25.0000 mg | ORAL_TABLET | Freq: Two times a day (BID) | ORAL | Status: DC
  Administered 2020-01-16 – 2020-01-17 (×4): 25 mg via ORAL
  Filled 2020-01-16 (×4): qty 1

## 2020-01-16 MED ORDER — ASPIRIN EC 81 MG PO TBEC
81.0000 mg | DELAYED_RELEASE_TABLET | Freq: Every day | ORAL | Status: DC
  Administered 2020-01-17: 10:00:00 81 mg via ORAL
  Filled 2020-01-16: qty 1

## 2020-01-16 NOTE — Progress Notes (Signed)
Progress Note  Patient Name: Victoria Avery Date of Encounter: 01/16/2020  Primary Cardiologist: Rockey Situ  Subjective   Had CP overnight, which she felt was secondary to anxiety. Received IV morphine and SL NTG. Notes some chest soreness at this time. Remains on heparin gtt. Still awaiting bed assignment at [facility], they are prioritizing her. She is tearful and frustrated with the delay in transfer.   Inpatient Medications    Scheduled Meds: . aspirin EC  81 mg Oral Daily  . atorvastatin  80 mg Oral q1800  . influenza vac split quadrivalent PF  0.5 mL Intramuscular Tomorrow-1000  . metoprolol tartrate  25 mg Oral BID  . sodium chloride flush  3 mL Intravenous Once  . sodium chloride flush  3 mL Intravenous Q12H   Continuous Infusions: . sodium chloride    . heparin 1,600 Units/hr (01/15/20 1637)   PRN Meds: sodium chloride, acetaminophen, albuterol, ALPRAZolam, labetalol, morphine injection, nitroGLYCERIN, ondansetron (ZOFRAN) IV, sodium chloride flush   Vital Signs    Vitals:   01/15/20 1133 01/15/20 1624 01/15/20 2037 01/16/20 0554  BP: 114/84 114/75 (!) 134/95 113/81  Pulse: 81 76 94 98  Resp: 16 16 19 19   Temp: 98.9 F (37.2 C) 98.7 F (37.1 C) 97.7 F (36.5 C) 98.4 F (36.9 C)  TempSrc: Oral Oral Oral Oral  SpO2: 97% 96% 96% 98%  Weight:    79.9 kg  Height:        Intake/Output Summary (Last 24 hours) at 01/16/2020 0732 Last data filed at 01/16/2020 0556 Gross per 24 hour  Intake 50 ml  Output 1300 ml  Net -1250 ml   Filed Weights   01/14/20 1456 01/15/20 0447 01/16/20 0554  Weight: 80 kg 79.7 kg 79.9 kg    Telemetry    SR - Personally Reviewed  ECG    No new tracings - Personally Reviewed  Physical Exam   GEN: No acute distress. Tearful.  Neck: No JVD. Cardiac: RRR, no murmurs, rubs, or gallops. Right radial cath site with minimal ecchymosis.  No bleeding, swelling, warmth, erythema, or tenderness to palpation at the right radial cath site.   Right radial pulse 2+. Respiratory: Clear to auscultation bilaterally.  GI: Soft, nontender, non-distended.   MS: No edema; No deformity. Neuro:  Alert and oriented x 3; Nonfocal.  Psych: Normal affect.  Labs    Chemistry Recent Labs  Lab 01/13/20 1517 01/14/20 0613  NA 138 135  K 4.2 3.9  CL 104 102  CO2 26 24  GLUCOSE 122* 140*  BUN 23* 18  CREATININE 1.06* 0.97  CALCIUM 9.4 9.2  GFRNONAA >60 >60  GFRAA >60 >60  ANIONGAP 8 9     Hematology Recent Labs  Lab 01/14/20 0613 01/15/20 0708 01/16/20 0456  WBC 7.1 6.7 6.1  RBC 4.37 4.21 4.03  HGB 14.3 13.8 13.3  HCT 41.7 40.5 38.6  MCV 95.4 96.2 95.8  MCH 32.7 32.8 33.0  MCHC 34.3 34.1 34.5  RDW 12.3 12.3 12.2  PLT 168 165 176    Cardiac EnzymesNo results for input(s): TROPONINI in the last 168 hours. No results for input(s): TROPIPOC in the last 168 hours.   BNPNo results for input(s): BNP, PROBNP in the last 168 hours.   DDimer No results for input(s): DDIMER in the last 168 hours.   Radiology    DG Chest 2 View  Result Date: 01/13/2020 IMPRESSION: No active cardiopulmonary disease. Electronically Signed   By: Madie Reno.D.  On: 01/13/2020 15:33    Cardiac Studies   2D echo 01/14/2020: 1. Left ventricular ejection fraction, by estimation, is 50 to 55%. The  left ventricle has low normal function. LV endocardial border not  optimally defined to evaluate regional wall motion. Left ventricular  diastolic parameters were normal.  2. Right ventricular systolic function is normal. The right ventricular  size is normal. Tricuspid regurgitation signal is inadequate for assessing  PA pressure.  3. The mitral valve is normal in structure and function. Trivial mitral  valve regurgitation. No evidence of mitral stenosis.  4. The aortic valve is normal in structure and function. Aortic valve  regurgitation is not visualized. No aortic stenosis is present.  5. The inferior vena cava is normal in size  with greater than 50%  respiratory variability, suggesting right atrial pressure of 3 mmHg.  _______________  [facility] 01/14/2020:  There is mild left ventricular systolic dysfunction.  LV end diastolic pressure is normal.  The left ventricular ejection fraction is 45-50% by visual estimate.  Prox RCA lesion is 90% stenosed.  Prox RCA to Mid RCA lesion is 100% stenosed.  Mid Cx to Dist Cx lesion is 99% stenosed.  Mid LAD-1 lesion is 90% stenosed.  Mid LAD-2 lesion is 60% stenosed.  1st Diag-1 lesion is 85% stenosed.  1st Diag-2 lesion is 70% stenosed.  1. Severe three-vessel coronary artery disease with chronic occlusion of the RCA with left-to-right collaterals, subtotal occlusion of the distal left circumflex supplying small OM branches and significant mid LAD stenosis and significant disease in ostial large first diagonal. OM branches are likely too small to graft. 2. Mildly reduced LV systolic function with an EF of 45 to 50% with inferior wall hypokinesis. 3. Normal left ventricular end-diastolic pressure.  Recommendations: I think the best option for complete revascularization is CABG. The patient was not sure if she wanted to go through that but I decided to get her off the Cath Lab table to discuss this further. PCI options including treating the LAD with PCI and leaving everything else to be treated medically. However, that will not lead to complete revascularization.  Patient Profile     54 y.o. female with history of systolic dysfunction, HTN, and ongoing tobacco use at 1 pack/day for "a long time"who is being seen today for the evaluation of NSTEMIat the request of Dr. Georgeann Oppenheim.  Assessment & Plan    1. NSTEMI: -Currently notes 1-3/10 chest pain -Initial high sensitivity troponin of6334with a delta of 9364and peak of 61950, down trending  -LHC on 01/14/2020 showed severe three-vessel CAD with CTO of the RCA with left-to-right collaterals, subtotal  occlusion of the distal LCx supplying small OM branches and significant mid LAD stenosis as well as significant disease in a large ostial D1.  The OM branches were felt to be too small to graft.  At time of cath her best option for complete revascularization was felt to be CABG.  Initially, patient was uncertain if she wanted to move forward with this.  After she has had time to think about this she is now agreeable to proceed with CABG and transfer to [facility].  This has been coordinated with our team in [place] -Still awaiting bed assignment at [facility], she is being prioritized  -ASA -Heparin gtt -Lopressor -Lipitor -SL NTG prn -IV morphine prn -Post-cath instructions  2.Systolic dysfunction: -Prior EF 45-50% with echo this admission showing an EF of 50 to 55% with EF by LV gram of 45 to 50%  with inferior wall hypokinesis -She previously declined ischemic evaluation at time of her initially noted cardiomyopathy in 2018 -LHC as outlined above -Continue metoprolol with escalation of GDMT as vitals and labs allow  -CHF education   3. HTN: -Blood pressure reasonably controlled -Continue metoprolol  -Low-sodium diet  4. HLD: -LDL of 126 from 10/2018 with goal LDL < 70 -Started on Lipitor 80 mg this admission -LDL 112 this admission -Follow up FLP and ALT in ~ 8 weeks, if LDL remains above goal at that time, add Zetia 10 mg  5. Tobacco use: -She declines nicotine patches during admission, though is interested in them at time of discharge  -Complete cessation is advised   6. Hyperglycemia: -A1c 5.7 -Follow-up with PCP as outpatient  For questions or updates, please contact [facility] Please consult [email or web address] for contact info under Cardiology/STEMI.    Signed, Eula Listen, PA-C [facility] Pager: [phone] 01/16/2020, 7:32 AM

## 2020-01-16 NOTE — Discharge Summary (Signed)
Physician Discharge Summary  Victoria Avery PJK:[id number] DOB: 06/03/1966 DOA: 01/13/2020  PCP: Patient, No Pcp Per  Admit date: 01/13/2020 Discharge date: 01/16/2020  Discharge disposition: Transfer to [facility]   Recommendations for Outpatient Follow-Up:   Follow-up with PCP and cardiologist as directed   Discharge Diagnosis:   Active Problems:   NSTEMI (non-ST elevated myocardial infarction) [facility])    Discharge Condition: Stable.  Diet recommendation: Heart healthy diet  Code status: Full code.    Hospital Course:    Victoria Avery a 53 y.o.femalewith medical history significant ofhypertension, tobacco abuse who presents for evaluation after acute onset of chest pressure x1 day.  She was admitted to the hospital for acute non-ST elevation MI.  She was seen in consultation by the cardiologist on.  She was treated with IV heparin infusion.  She underwent cardiac catheterization and she was found to have multiple vessel disease not amenable to PCI.  Cardiologist therefore recommended that patient be transferred to [facility] for evaluation for CABG.  Patient was seen on the day of discharge.  She has agreed to go to [facility] for further evaluation.  She has no complaints.  She said she feels better knowing that she is going to be transferred today after waiting for so long.     Discharge Exam:   Vitals:   01/16/20 0804 01/16/20 0911  BP: 128/88 128/88  Pulse: 80 80  Resp: 19 (!) 78  Temp: 98.5 F (36.9 C) 98.5 F (36.9 C)  SpO2: 97% 97%   Vitals:   01/15/20 2037 01/16/20 0554 01/16/20 0804 01/16/20 0911  BP: (!) 134/95 113/81 128/88 128/88  Pulse: 94 98 80 80  Resp: 19 19 19  (!) 78  Temp: 97.7 F (36.5 C) 98.4 F (36.9 C) 98.5 F (36.9 C) 98.5 F (36.9 C)  TempSrc: Oral Oral Oral   SpO2: 96% 98% 97% 97%  Weight:  79.9 kg    Height:         GEN: NAD SKIN: No rash EYES: EOMI ENT: MMM CV: RRR PULM: CTA B ABD:  soft, ND, NT, +BS CNS: AAO x 3, non focal EXT: No edema or tenderness   The results of significant diagnostics from this hospitalization (including imaging, microbiology, ancillary and laboratory) are listed below for reference.     Procedures and Diagnostic Studies:   CARDIAC CATHETERIZATION  Result Date: 01/14/2020  There is mild left ventricular systolic dysfunction.  LV end diastolic pressure is normal.  The left ventricular ejection fraction is 45-50% by visual estimate.  Prox RCA lesion is 90% stenosed.  Prox RCA to Mid RCA lesion is 100% stenosed.  Mid Cx to Dist Cx lesion is 99% stenosed.  Mid LAD-1 lesion is 90% stenosed.  Mid LAD-2 lesion is 60% stenosed.  1st Diag-1 lesion is 85% stenosed.  1st Diag-2 lesion is 70% stenosed.  1.  Severe three-vessel coronary artery disease with chronic occlusion of the RCA with left-to-right collaterals, subtotal occlusion of the distal left circumflex supplying small OM branches and significant mid LAD stenosis and significant disease in ostial large first diagonal.  OM branches are likely too small to graft. 2.  Mildly reduced LV systolic function with an EF of 45 to 50% with inferior wall hypokinesis. 3.  Normal left ventricular end-diastolic pressure. Recommendations: I think the best option for complete revascularization is CABG.  The patient was not sure if she wanted to go through that but I decided to get her  off the Cath Lab table to discuss this further.  PCI options including treating the LAD with PCI and leaving everything else to be treated medically.  However, that will not lead to complete revascularization.   ECHOCARDIOGRAM COMPLETE  Result Date: 01/14/2020    ECHOCARDIOGRAM REPORT   Patient Name:   Victoria Avery Date of Exam: 01/14/2020 Medical Rec #:  [id number]          Height:       62.0 in Accession #:    [id number]         Weight:       176.4 lb Date of Birth:  Aug 08, 1966         BSA:          1.81 m Patient Age:    53  years           BP:           136/96 mmHg Patient Gender: F                  HR:           79 bpm. Exam Location:  [facility] Procedure: 2D Echo, Color Doppler and Cardiac Doppler Indications:     I21.4 NSTEMI  History:         Patient has prior history of Echocardiogram examinations.                  Cardiomyopathy; Risk Factors:Current Smoker and Hypertension.  Sonographer:     Humphrey Rolls RDCS (AE) Referring Phys:  [id number] Tresa Moore Diagnosing Phys: Lorine Bears MD  Sonographer Comments: Suboptimal parasternal window. IMPRESSIONS  1. Left ventricular ejection fraction, by estimation, is 50 to 55%. The left ventricle has low normal function. LV endocardial border not optimally defined to evaluate regional wall motion. Left ventricular diastolic parameters were normal.  2. Right ventricular systolic function is normal. The right ventricular size is normal. Tricuspid regurgitation signal is inadequate for assessing PA pressure.  3. The mitral valve is normal in structure and function. Trivial mitral valve regurgitation. No evidence of mitral stenosis.  4. The aortic valve is normal in structure and function. Aortic valve regurgitation is not visualized. No aortic stenosis is present.  5. The inferior vena cava is normal in size with greater than 50% respiratory variability, suggesting right atrial pressure of 3 mmHg. FINDINGS  Left Ventricle: Left ventricular ejection fraction, by estimation, is 50 to 55%. The left ventricle has low normal function. LV endocardial border not optimally defined to evaluate regional wall motion. The left ventricular internal cavity size was normal in size. There is no left ventricular hypertrophy. Left ventricular diastolic parameters were normal. Right Ventricle: The right ventricular size is normal. No increase in right ventricular wall thickness. Right ventricular systolic function is normal. Tricuspid regurgitation signal is inadequate for assessing PA pressure. Left Atrium:  Left atrial size was normal in size. Right Atrium: Right atrial size was normal in size. Pericardium: There is no evidence of pericardial effusion. Mitral Valve: The mitral valve is normal in structure and function. Normal mobility of the mitral valve leaflets. Trivial mitral valve regurgitation. No evidence of mitral valve stenosis. MV peak gradient, 5.0 mmHg. The mean mitral valve gradient is 2.0 mmHg. Tricuspid Valve: The tricuspid valve is normal in structure. Tricuspid valve regurgitation is not demonstrated. No evidence of tricuspid stenosis. Aortic Valve: The aortic valve is normal in structure and function. Aortic valve regurgitation is not visualized. No aortic stenosis  is present. Aortic valve mean gradient measures 5.0 mmHg. Aortic valve peak gradient measures 9.4 mmHg. Aortic valve area, by VTI measures 1.29 cm. Pulmonic Valve: The pulmonic valve was normal in structure. Pulmonic valve regurgitation is not visualized. No evidence of pulmonic stenosis. Aorta: The aortic root is normal in size and structure. Venous: The inferior vena cava is normal in size with greater than 50% respiratory variability, suggesting right atrial pressure of 3 mmHg. IAS/Shunts: No atrial level shunt detected by color flow Doppler.  LEFT VENTRICLE PLAX 2D LVIDd:         4.64 cm     Diastology LVIDs:         4.01 cm     LV e' lateral:   14.50 cm/s LV PW:         0.95 cm     LV E/e' lateral: 6.4 LV IVS:        0.76 cm     LV e' medial:    7.83 cm/s LVOT diam:     1.70 cm     LV E/e' medial:  11.9 LV SV:         35.86 ml LV SV Index:   15.19 LVOT Area:     2.27 cm  LV Volumes (MOD) LV vol d, MOD A2C: 91.6 ml LV vol d, MOD A4C: 82.0 ml LV vol s, MOD A2C: 37.2 ml LV vol s, MOD A4C: 38.5 ml LV SV MOD A2C:     54.4 ml LV SV MOD A4C:     82.0 ml LV SV MOD BP:      47.9 ml RIGHT VENTRICLE RV Basal diam:  3.27 cm LEFT ATRIUM             Index       RIGHT ATRIUM          Index LA diam:        3.30 cm 1.82 cm/m  RA Area:     9.50 cm LA  Vol (A2C):   35.9 ml 19.81 ml/m RA Volume:   18.00 ml 9.93 ml/m LA Vol (A4C):   29.1 ml 16.06 ml/m LA Biplane Vol: 32.4 ml 17.88 ml/m  AORTIC VALVE                    PULMONIC VALVE AV Area (Vmax):    1.24 cm     PV Vmax:       0.90 m/s AV Area (Vmean):   1.09 cm     PV Vmean:      67.000 cm/s AV Area (VTI):     1.29 cm     PV VTI:        0.169 m AV Vmax:           153.00 cm/s  PV Peak grad:  3.3 mmHg AV Vmean:          110.000 cm/s PV Mean grad:  2.0 mmHg AV VTI:            0.278 m AV Peak Grad:      9.4 mmHg AV Mean Grad:      5.0 mmHg LVOT Vmax:         83.50 cm/s LVOT Vmean:        52.900 cm/s LVOT VTI:          0.158 m LVOT/AV VTI ratio: 0.57  AORTA Ao Root diam: 2.60 cm MITRAL VALVE MV Area (PHT): 4.68 cm    SHUNTS MV Peak  grad:  5.0 mmHg    Systemic VTI:  0.16 m MV Mean grad:  2.0 mmHg    Systemic Diam: 1.70 cm MV Vmax:       1.12 m/s MV Vmean:      68.8 cm/s MV Decel Time: 162 msec MV E velocity: 93.10 cm/s MV A velocity: 99.90 cm/s MV E/A ratio:  0.93 Lorine Bears MD Electronically signed by Lorine Bears MD Signature Date/Time: 01/14/2020/10:24:20 AM    Final (Updated)      Labs:   Basic Metabolic Panel: Recent Labs  Lab 01/13/20 1517 01/14/20 0613  NA 138 135  K 4.2 3.9  CL 104 102  CO2 26 24  GLUCOSE 122* 140*  BUN 23* 18  CREATININE 1.06* 0.97  CALCIUM 9.4 9.2   GFR Estimated Creatinine Clearance: 67.2 mL/min (by C-G formula based on SCr of 0.97 mg/dL). Liver Function Tests: No results for input(s): AST, ALT, ALKPHOS, BILITOT, PROT, ALBUMIN in the last 168 hours. No results for input(s): LIPASE, AMYLASE in the last 168 hours. No results for input(s): AMMONIA in the last 168 hours. Coagulation profile Recent Labs  Lab 01/13/20 1638 01/14/20 0613  INR 1.0 1.0    CBC: Recent Labs  Lab 01/13/20 1517 01/14/20 0613 01/15/20 0708 01/16/20 0456  WBC 8.4 7.1 6.7 6.1  HGB 15.0 14.3 13.8 13.3  HCT 43.3 41.7 40.5 38.6  MCV 96.7 95.4 96.2 95.8  PLT 190 168 165  176   Cardiac Enzymes: No results for input(s): CKTOTAL, CKMB, CKMBINDEX, TROPONINI in the last 168 hours. BNP: Invalid input(s): POCBNP CBG: No results for input(s): GLUCAP in the last 168 hours. D-Dimer No results for input(s): DDIMER in the last 72 hours. Hgb A1c Recent Labs    01/14/20 1415  HGBA1C 5.7*   Lipid Profile Recent Labs    01/14/20 1415  CHOL 197  HDL 38*  LDLCALC 112*  TRIG 234*  CHOLHDL 5.2   Thyroid function studies No results for input(s): TSH, T4TOTAL, T3FREE, THYROIDAB in the last 72 hours.  Invalid input(s): FREET3 Anemia work up No results for input(s): VITAMINB12, FOLATE, FERRITIN, TIBC, IRON, RETICCTPCT in the last 72 hours. Microbiology Recent Results (from the past 240 hour(s))  SARS CORONAVIRUS 2 (TAT 6-24 HRS) Nasopharyngeal Nasopharyngeal Swab     Status: None   Collection Time: 01/13/20  4:38 PM   Specimen: Nasopharyngeal Swab  Result Value Ref Range Status   SARS Coronavirus 2 NEGATIVE NEGATIVE Final    Comment: (NOTE) SARS-CoV-2 target nucleic acids are NOT DETECTED. The SARS-CoV-2 RNA is generally detectable in upper and lower respiratory specimens during the acute phase of infection. Negative results do not preclude SARS-CoV-2 infection, do not rule out co-infections with other pathogens, and should not be used as the sole basis for treatment or other patient management decisions. Negative results must be combined with clinical observations, patient history, and epidemiological information. The expected result is Negative. Fact Sheet for Patients: HairSlick.no Fact Sheet for Healthcare Providers: quierodirigir.com This test is not yet approved or cleared by the [place] FDA and  has been authorized for detection and/or diagnosis of SARS-CoV-2 by FDA under an Emergency Use Authorization (EUA). This EUA will remain  in effect (meaning this test can be used) for the  duration of the COVID-19 declaration under Section 56 4(b)(1) of the Act, 21 U.S.C. section 360bbb-3(b)(1), unless the authorization is terminated or revoked sooner. Performed at [facility], [address], [place], [place] [address]  Discharge Instructions:   Discharge Instructions    Diet - low sodium heart healthy   Complete by: As directed    Increase activity slowly   Complete by: As directed      Allergies as of 01/16/2020   No Known Allergies     Medication List    TAKE these medications   lisinopril-hydrochlorothiazide 20-12.5 MG tablet Commonly known as: ZESTORETIC Take 1.5 tablets by mouth daily.   metoprolol succinate 50 MG 24 hr tablet Commonly known as: TOPROL-XL Take 50 mg by mouth daily. Take with or immediately following a meal.         Time coordinating discharge: 28 minutes  Signed:  Malashia Kamaka  [facility] 01/16/2020, 9:31 AM

## 2020-01-16 NOTE — Progress Notes (Signed)
[facility] called with bed assignment [address] room 19. Report given to [facility] and floor RN, Baird Lyons. Per [facility] ETA 20 mins.

## 2020-01-16 NOTE — Progress Notes (Signed)
1705: pt c/o 5/10 chest pressure EKG obtained, 4L [place] applied BP 133/86  1711: 1 nitro given Irene Limbo, PA paged.  1716: BP 110/75 2/10 chest pain Irene Limbo returned call, said to wait 10 minutes to see if BP will tolerate another nitro.  1721: 1/10 chest pain  1726: BP 101/73 Chest pain 0/10. Will hold nitro

## 2020-01-16 NOTE — Progress Notes (Signed)
Pt c/o mild chest pressure 5/10, cardiologist at bedside and aware. No interventions at this time. Will continue to monitor.

## 2020-01-16 NOTE — Progress Notes (Signed)
ANTICOAGULATION CONSULT NOTE -  Pharmacy Consult for Heparin  Indication: chest pain/ACS  No Known Allergies  Patient Measurements: Height: 5\' 2"  (157.5 cm) Weight: 175 lb 9.6 oz (79.7 kg) IBW/kg (Calculated) : 50.1 Heparin Dosing Weight: 69.7 kg   Vital Signs: Temp: 98.4 F (36.9 C) (02/17 0554) Temp Source: Oral (02/17 0554) BP: 113/81 (02/17 0554) Pulse Rate: 98 (02/17 0554)  Labs: Recent Labs    01/13/20 1517 01/13/20 1517 01/13/20 1638 01/13/20 1809 01/13/20 2304 01/14/20 0058 01/14/20 01/16/20 01/14/20 1415 01/15/20 0708 01/15/20 0708 01/15/20 1445 01/15/20 2151 01/16/20 0456  HGB 15.0   < >  --   --   --   --  14.3  --  13.8  --   --   --  13.3  HCT 43.3   < >  --   --   --   --  41.7  --  40.5  --   --   --  38.6  PLT 190   < >  --   --   --   --  168  --  165  --   --   --  176  APTT  --   --  29  --   --   --   --   --   --   --   --   --   --   LABPROT  --   --  12.6  --   --   --  12.8  --   --   --   --   --   --   INR  --   --  1.0  --   --   --  1.0  --   --   --   --   --   --   HEPARINUNFRC  --    < >  --   --  <0.10*  --  0.19*   < > 0.11*   < > 0.19* 0.45 0.36  CREATININE 1.06*  --   --   --   --   --  0.97  --   --   --   --   --   --   TROPONINIHS 6,334*   < >  --  9,364* 11,713* 10,477*  --   --   --   --   --   --   --    < > = values in this interval not displayed.    Estimated Creatinine Clearance: 67 mL/min (by C-G formula based on SCr of 0.97 mg/dL).   Medical History: Past Medical History:  Diagnosis Date  . Cardiomyopathy (HCC)    a. 10/2017 Echo: EF 45-50% Gr1 DD, mild MR. Nl LA size. Nl RV fxn and PASP.  11/2017 Hypertension   . Tobacco abuse     Medications:  Medications Prior to Admission  Medication Sig Dispense Refill Last Dose  . lisinopril-hydrochlorothiazide (ZESTORETIC) 20-12.5 MG tablet Take 1.5 tablets by mouth daily.    01/13/2020 at 0800  . metoprolol succinate (TOPROL-XL) 50 MG 24 hr tablet Take 50 mg by mouth daily.  Take with or immediately following a meal.   01/13/2020 at 0800    Assessment: Pharmacy consulted to dose heparin in this 54 year old female admitted with ACS/NSTEMI.  CrCl = 63.9 ml/min , no prior anticoag noted. S/p cath 2/15 - Cards recommend CABG.   Heparin Course: 2/14 1707 Heparin initiated at 800 units/hr 2/14 2304  HL <0.10, bolus 2100 units and increase to 1000 units/hr 2/15 0613 HL 0.19 2/15 1415 HL: 0.37 Level therapeutic x 1  2/16 0708 HL : 0.11  2/16 1445 HL: 0.19 2/16 2151 HL: 0.45 2/17 0456 HL: 0.36 therapeutic x 2, CBC stable  Goal of Therapy:  Heparin level 0.3-0.7 units/ml Monitor platelets by anticoagulation protocol: Yes   Plan:  Heparin level therapeutic x 2. CBC stable.  Will continue current rate of 1600 units/hr.  Recheck Heparin level and CBC daily in am.   Ena Dawley, PharmD Clinical Pharmacist 01/16/2020 5:54 AM

## 2020-01-16 NOTE — Progress Notes (Signed)
   Paged by RN that patient having 5/10 chest pain. BP 133/86. EKG showed no acute changes compare to prior tracing. She was given one dose of sublingual Nitro with improvement to 2/10. She reportedly had similar pain while at [facility] which improved with multiple dose of sublingual Nitro. Systolic BP now 110 mmHG 5 minutes after first dose of Nitro. Will wait 5 more minutes. If still having chest pain and systolic BP >105, will plan to give one more dose of sublingual Nitro.   Corrin Parker, PA-C 01/16/2020 5:21 PM

## 2020-01-16 NOTE — Consult Note (Addendum)
[address]       [place] [address]             [phone]        Victoria Avery [facility] Medical Record #[id number] Date of Birth: [birth year]  Referring: Dr. Kirke Corin Primary Care: Patient, No Pcp Per Primary Cardiologist:Timothy Mariah Milling, MD  Chief Complaint: CAD  History of Present Illness:      Victoria Avery is a 54 yo white female with history of HTN and nicotine abuse of 1 ppd.  She was admitted back in 2018 for a hypertensive urgency.  During that time Echocardiogram was obtained and showed an EF of 45-50% with grade 1 diastolic dysfunction, and mild mitral regurgitation.  She was last seen by Dr. Mariah Milling in March of 2020 at which time she was doing well.  The patient presented to [facility] on 01/13/2020.  The patient stated she had not been feeling well for several days.  On 01/13/2020 she awoke with chest pain.  Despite this the patient went to breakfast but when she came home she continued to not feel well.  She went to lay down and the patient's chest pressure persisted.  She denied associated shortness of breath, palpitations, nausea, or vomiting.  Workup in the ED consisted of EKG which showed ST depression.  Her troponin levels were positive.  She was ruled in for NSTEMI.  She was given NTG which relieved her pain, started on Heparin and admitted for further care.  Cardiology consult was obtained.  They felt she should undergo cardiac catheterization.  This was performed 01/14/2020 and showed a mildly reduced EF and multivessel CAD.  It was felt coronary bypass grafting would be indicated.  She was transferred to  [facility] for surgical consultation.  She is currently chest pain free.  She states she isn't really sure of her family history as she doesn't know her dad that well.  However she believes her mother and father have both had coronary disease.  She remains functionally active.  She works full times at [employer], but states she  doesn't do very strenuous work there.  After further questioning she does admit that she has had some decreased energy over the past few weeks.  She is agreeable to proceed with surgery and wants to get every taken care of.  Current Activity/ Functional Status: Patient is independent with mobility/ambulation, transfers, ADL's, IADL's.   Zubrod Score: At the time of surgery this patient's most appropriate activity status/level should be described as: []     0    Normal activity, no symptoms []     1    Restricted in physical strenuous activity but ambulatory, able to do out light work []     2    Ambulatory and capable of self care, unable to do work activities, up and about                 more than 50%  Of the time                            []     3    Only limited self care, in bed greater than 50% of waking hours []     4    Completely disabled, no self care, confined to bed or chair []     5    Moribund  Past Medical History:  Diagnosis Date  .  Cardiomyopathy (HCC)    a. 10/2017 Echo: EF 45-50% Gr1 DD, mild MR. Nl LA size. Nl RV fxn and PASP.  Marland Kitchen Hypertension   . Tobacco abuse     Past Surgical History:  Procedure Laterality Date  . CESAREAN SECTION    . LEFT HEART CATH AND CORONARY ANGIOGRAPHY N/A 01/14/2020   Procedure: LEFT HEART CATH AND CORONARY ANGIOGRAPHY;  Surgeon: Iran Ouch, MD;  Location: [facility] INVASIVE CV LAB;  Service: Cardiovascular;  Laterality: N/A;    Social History   Tobacco Use  Smoking Status Current Every Day Smoker  . Packs/day: 1.00  . Years: 30.00  . Pack years: 30.00  . Types: Cigarettes  Smokeless Tobacco Never Used    Social History   Substance and Sexual Activity  Alcohol Use Yes   Comment: social      No Known Allergies  Current Facility-Administered Medications  Medication Dose Route Frequency Provider Last Rate Last Admin  . acetaminophen (TYLENOL) tablet 650 mg  650 mg Oral Q4H PRN Eula Listen M, PA-C      . albuterol (PROVENTIL)  (2.5 MG/3ML) 0.083% nebulizer solution 3 mL  3 mL Inhalation Q6H PRN Sondra Barges, PA-C      . ALPRAZolam Prudy Feeler) tablet 0.25 mg  0.25 mg Oral TID PRN Sondra Barges, PA-C      . [START ON 01/17/2020] aspirin EC tablet 81 mg  81 mg Oral Daily Dunn, Ryan M, PA-C      . atorvastatin (LIPITOR) tablet 80 mg  80 mg Oral q1800 Sondra Barges, PA-C      . heparin ADULT infusion 100 units/mL (25000 units/218mL sodium chloride 0.45%)  1,600 Units/hr Intravenous Continuous Earnie Larsson, RPH      . labetalol (NORMODYNE) injection 10 mg  10 mg Intravenous Q4H PRN Eula Listen M, PA-C      . metoprolol tartrate (LOPRESSOR) tablet 25 mg  25 mg Oral BID Eula Listen M, PA-C   25 mg at 01/16/20 1251  . morphine 2 MG/ML injection 2 mg  2 mg Intravenous Q4H PRN Eula Listen M, PA-C      . nitroGLYCERIN (NITROSTAT) SL tablet 0.4 mg  0.4 mg Sublingual Q5 Min x 3 PRN Eula Listen M, PA-C      . ondansetron [facility]) injection 4 mg  4 mg Intravenous Q6H PRN Sondra Barges, PA-C        Medications Prior to Admission  Medication Sig Dispense Refill Last Dose  . lisinopril-hydrochlorothiazide (ZESTORETIC) 20-12.5 MG tablet Take 1.5 tablets by mouth daily.      . metoprolol succinate (TOPROL-XL) 50 MG 24 hr tablet Take 50 mg by mouth daily. Take with or immediately following a meal.       Family History  Problem Relation Age of Onset  . Hypertension Mother   . CAD Mother    Review of Systems:   ROS    Cardiac Review of Systems: Y or  [    ]= no  Chest Pain [ Y, on admission   ]  Resting SOB [ N  ] Exertional SOB  [N  ]  Orthopnea [  ]   Pedal Edema [ N  ]    Palpitations [ N ] Syncope  [ N ]   Presyncope [   ]  General Review of Systems: [Y] = yes [  ]=no Constitional: recent weight change [  ]; anorexia [  ]; fatigue [Y, with activity after further questioning  ];  nausea [ N ]; night sweats [ N ]; fever [ N ]; or chills [  ]                                                               Dental: Last Dentist visit:   Eye :  blurred vision [  ]; diplopia [   ]; vision changes [  ];  Amaurosis fugax[  ]; Resp: cough Klaus.Mock  ];  wheezing[N  ];  hemoptysis[  ]; shortness of breath[N  ]; paroxysmal nocturnal dyspnea[  ]; dyspnea on exertion[ N ]; or orthopnea[  ];  GI:  gallstones[  ], vomiting[ N ];  dysphagia[ N ]; melena[  ];  hematochezia [  ]; heartburn[  ];   Hx of  Colonoscopy[  ]; GU: kidney stones [  ]; hematuria[  ];   dysuria [  ];  nocturia[  ];  history of     obstruction [  ]; urinary frequency [  ]             Skin: rash, swelling[N ];, hair loss[  ];  peripheral edema[  ];  or itching[  ]; Musculosketetal: myalgias[  ];  joint swelling[  ];  joint erythema[  ];  joint pain[  ];  back pain[  ];  Heme/Lymph: bruising[  ];  bleeding[  ];  anemia[  ];  Neuro: TIA[  ];  headaches[N  ];  stroke[ N ];  vertigo[  ];  seizures[  ];   paresthesias[  ];  difficulty walking[  ];  Psych:depression[N  ]; anxiety[ N ];  Endocrine: diabetes[ N ];  thyroid dysfunction[N  ];  Physical Exam: BP 122/85   Pulse 75   Temp 98.8 F (37.1 C) (Oral)   LMP 11/25/2017   SpO2 99%   General appearance: alert, cooperative and no distress Head: Normocephalic, without obvious abnormality, atraumatic Lymph nodes: Cervical, supraclavicular, and axillary nodes normal. Resp: clear to auscultation bilaterally Cardio: regular rate and rhythm GI: soft, non-tender; bowel sounds normal; no masses,  no organomegaly Extremities: extremities normal, atraumatic, no cyanosis or edema Neurologic: Grossly normal  Diagnostic Studies & Laboratory data:     Recent Radiology Findings:   CARDIAC CATHETERIZATION  Result Date: 01/14/2020  There is mild left ventricular systolic dysfunction.  LV end diastolic pressure is normal.  The left ventricular ejection fraction is 45-50% by visual estimate.  Prox RCA lesion is 90% stenosed.  Prox RCA to Mid RCA lesion is 100% stenosed.  Mid Cx to Dist Cx lesion is 99% stenosed.  Mid LAD-1 lesion is 90%  stenosed.  Mid LAD-2 lesion is 60% stenosed.  1st Diag-1 lesion is 85% stenosed.  1st Diag-2 lesion is 70% stenosed.  1.  Severe three-vessel coronary artery disease with chronic occlusion of the RCA with left-to-right collaterals, subtotal occlusion of the distal left circumflex supplying small OM branches and significant mid LAD stenosis and significant disease in ostial large first diagonal.  OM branches are likely too small to graft. 2.  Mildly reduced LV systolic function with an EF of 45 to 50% with inferior wall hypokinesis. 3.  Normal left ventricular end-diastolic pressure. Recommendations: I think the best option for complete revascularization is CABG.  The patient was not sure if she  wanted to go through that but I decided to get her off the Cath Lab table to discuss this further.  PCI options including treating the LAD with PCI and leaving everything else to be treated medically.  However, that will not lead to complete revascularization.     I have independently reviewed the above radiologic studies and discussed with the patient   Recent Lab Findings: Lab Results  Component Value Date   WBC 6.1 01/16/2020   HGB 13.3 01/16/2020   HCT 38.6 01/16/2020   PLT 176 01/16/2020   GLUCOSE 140 (H) 01/14/2020   CHOL 197 01/14/2020   TRIG 234 (H) 01/14/2020   HDL 38 (L) 01/14/2020   LDLCALC 112 (H) 01/14/2020   ALT 24 11/25/2017   AST 31 11/25/2017   NA 135 01/14/2020   K 3.9 01/14/2020   CL 102 01/14/2020   CREATININE 0.97 01/14/2020   BUN 18 01/14/2020   CO2 24 01/14/2020   TSH 5.503 (H) 11/25/2017   INR 1.0 01/14/2020   HGBA1C 5.7 (H) 01/14/2020    Assessment / Plan:      1. CAD- multivessel CAD, presented with chest pain ruled in for NSTEMI- cath shows multivessel CAD, needs CABG 2. HTN- on Zestoretic, Toprol XL prior to admission 3. Nicotine abuse- 1ppd, patient educated on importance of cessation to ensure longevity of bypass grafts 4. Preoperative workup- PFTs, Duplex,  labs 5. Dispo- patient stable, currently chest pain free, tentatively planning for bypass surgery possibly off pump on Friday 2/19  I  spent 55 minutes counseling the patient face to face.   Ellwood Handler, PA-C 01/16/2020 2:34 PM    Agree with above.  54 yo female with severe 3V CAD, now presents with an NSTEMI.  Hx of tobacco use.  She has been relatively asymptomatic until the week prior to her admission.  On exam, she appears well.  She is in sinus rhythm, and has clear lung sounds.  There is no significant lower extremity swelling.  Her echo shows an EF of 50% without significant valvular disease.  Her LHC shows an occluded RCA that fills via L>R collaterals.  Her LAD, and PDA are small, but potentially graftable.  Her circumflex system is too small for grafting.  Risks and benefits for a CABG 3 have been discussed in detail, and she agreeable to proceed with surgery.  Khady Vandenberg Bary Leriche

## 2020-01-17 ENCOUNTER — Inpatient Hospital Stay (HOSPITAL_COMMUNITY): Payer: BC Managed Care – PPO

## 2020-01-17 DIAGNOSIS — I214 Non-ST elevation (NSTEMI) myocardial infarction: Principal | ICD-10-CM

## 2020-01-17 DIAGNOSIS — Z01810 Encounter for preprocedural cardiovascular examination: Secondary | ICD-10-CM

## 2020-01-17 LAB — CBC
HCT: 39 % (ref 36.0–46.0)
Hemoglobin: 13.1 g/dL (ref 12.0–15.0)
MCH: 32.6 pg (ref 26.0–34.0)
MCHC: 33.6 g/dL (ref 30.0–36.0)
MCV: 97 fL (ref 80.0–100.0)
Platelets: 192 10*3/uL (ref 150–400)
RBC: 4.02 MIL/uL (ref 3.87–5.11)
RDW: 12.1 % (ref 11.5–15.5)
WBC: 7.7 10*3/uL (ref 4.0–10.5)
nRBC: 0 % (ref 0.0–0.2)

## 2020-01-17 LAB — BASIC METABOLIC PANEL
Anion gap: 11 (ref 5–15)
BUN: 18 mg/dL (ref 6–20)
CO2: 24 mmol/L (ref 22–32)
Calcium: 9.5 mg/dL (ref 8.9–10.3)
Chloride: 102 mmol/L (ref 98–111)
Creatinine, Ser: 1.08 mg/dL — ABNORMAL HIGH (ref 0.44–1.00)
GFR calc Af Amer: >60 mL/min (ref >60)
GFR calc non Af Amer: 59 mL/min — ABNORMAL LOW (ref >60)
Glucose, Bld: 136 mg/dL — ABNORMAL HIGH (ref 70–99)
Potassium: 4.3 mmol/L (ref 3.5–5.1)
Sodium: 137 mmol/L (ref 135–145)

## 2020-01-17 LAB — PREPARE RBC (CROSSMATCH)

## 2020-01-17 LAB — HEPARIN LEVEL (UNFRACTIONATED): Heparin Unfractionated: 0.38 IU/mL (ref 0.30–0.70)

## 2020-01-17 LAB — ABO/RH: ABO/RH(D): O POS

## 2020-01-17 MED ORDER — SODIUM CHLORIDE 0.9 % IV SOLN
750.0000 mg | INTRAVENOUS | Status: DC
  Filled 2020-01-17: qty 750

## 2020-01-17 MED ORDER — CHLORHEXIDINE GLUCONATE CLOTH 2 % EX PADS
6.0000 | MEDICATED_PAD | Freq: Once | CUTANEOUS | Status: AC
  Administered 2020-01-17: 6 via TOPICAL

## 2020-01-17 MED ORDER — CHLORHEXIDINE GLUCONATE 0.12 % MT SOLN
15.0000 mL | Freq: Once | OROMUCOSAL | Status: AC
  Administered 2020-01-18: 06:00:00 15 mL via OROMUCOSAL
  Filled 2020-01-17: qty 15

## 2020-01-17 MED ORDER — VANCOMYCIN HCL 1250 MG/250ML IV SOLN
1250.0000 mg | INTRAVENOUS | Status: AC
  Administered 2020-01-18: 1250 mg via INTRAVENOUS
  Filled 2020-01-17: qty 250

## 2020-01-17 MED ORDER — TRANEXAMIC ACID (OHS) PUMP PRIME SOLUTION
2.0000 mg/kg | INTRAVENOUS | Status: DC
  Filled 2020-01-17: qty 1.59

## 2020-01-17 MED ORDER — POTASSIUM CHLORIDE 2 MEQ/ML IV SOLN
80.0000 meq | INTRAVENOUS | Status: DC
  Filled 2020-01-17: qty 40

## 2020-01-17 MED ORDER — SODIUM CHLORIDE 0.9 % IV SOLN
INTRAVENOUS | Status: DC
  Filled 2020-01-17: qty 30

## 2020-01-17 MED ORDER — PLASMA-LYTE 148 IV SOLN
INTRAVENOUS | Status: DC
  Filled 2020-01-17: qty 2.5

## 2020-01-17 MED ORDER — TRANEXAMIC ACID (OHS) BOLUS VIA INFUSION
15.0000 mg/kg | INTRAVENOUS | Status: AC
  Administered 2020-01-18: 1194 mg via INTRAVENOUS
  Filled 2020-01-17: qty 1194

## 2020-01-17 MED ORDER — NOREPINEPHRINE 4 MG/250ML-% IV SOLN
0.0000 ug/min | INTRAVENOUS | Status: AC
  Administered 2020-01-18: 10 ug/min via INTRAVENOUS
  Administered 2020-01-18: 5 ug/min via INTRAVENOUS
  Filled 2020-01-17: qty 250

## 2020-01-17 MED ORDER — EPINEPHRINE HCL 5 MG/250ML IV SOLN IN NS
0.0000 ug/min | INTRAVENOUS | Status: DC
  Filled 2020-01-17: qty 250

## 2020-01-17 MED ORDER — PHENYLEPHRINE HCL-NACL 20-0.9 MG/250ML-% IV SOLN
30.0000 ug/min | INTRAVENOUS | Status: AC
  Administered 2020-01-18: 50 ug/min via INTRAVENOUS
  Filled 2020-01-17: qty 250

## 2020-01-17 MED ORDER — MILRINONE LACTATE IN DEXTROSE 20-5 MG/100ML-% IV SOLN
0.3000 ug/kg/min | INTRAVENOUS | Status: DC
  Filled 2020-01-17: qty 100

## 2020-01-17 MED ORDER — SODIUM CHLORIDE 0.9 % IV SOLN
1.5000 g | INTRAVENOUS | Status: AC
  Administered 2020-01-18: 1.5 g via INTRAVENOUS
  Filled 2020-01-17: qty 1.5

## 2020-01-17 MED ORDER — BISACODYL 5 MG PO TBEC
5.0000 mg | DELAYED_RELEASE_TABLET | Freq: Once | ORAL | Status: AC
  Administered 2020-01-17: 5 mg via ORAL
  Filled 2020-01-17: qty 1

## 2020-01-17 MED ORDER — TRANEXAMIC ACID 1000 MG/10ML IV SOLN
1.5000 mg/kg/h | INTRAVENOUS | Status: AC
  Administered 2020-01-18: 1.5 mg/kg/h via INTRAVENOUS
  Filled 2020-01-17: qty 25

## 2020-01-17 MED ORDER — NITROGLYCERIN IN D5W 200-5 MCG/ML-% IV SOLN
2.0000 ug/min | INTRAVENOUS | Status: AC
  Administered 2020-01-18: 16.6 ug/min via INTRAVENOUS
  Filled 2020-01-17: qty 250

## 2020-01-17 MED ORDER — MANNITOL 20 % IV SOLN
INTRAVENOUS | Status: DC
  Filled 2020-01-17: qty 13

## 2020-01-17 MED ORDER — CHLORHEXIDINE GLUCONATE CLOTH 2 % EX PADS: 6.0000 | MEDICATED_PAD | Freq: Once | CUTANEOUS | Status: DC

## 2020-01-17 MED ORDER — DEXMEDETOMIDINE HCL IN NACL 400 MCG/100ML IV SOLN
0.1000 ug/kg/h | INTRAVENOUS | Status: AC
  Administered 2020-01-18: .7 ug/kg/h via INTRAVENOUS
  Filled 2020-01-17: qty 100

## 2020-01-17 MED ORDER — INSULIN REGULAR(HUMAN) IN NACL 100-0.9 UT/100ML-% IV SOLN
INTRAVENOUS | Status: AC
  Administered 2020-01-18: 1 [IU]/h via INTRAVENOUS
  Filled 2020-01-17: qty 100

## 2020-01-17 MED ORDER — TEMAZEPAM 15 MG PO CAPS: 15.0000 mg | ORAL_CAPSULE | Freq: Once | ORAL | Status: DC | PRN

## 2020-01-17 MED ORDER — METOPROLOL TARTRATE 12.5 MG HALF TABLET
12.5000 mg | ORAL_TABLET | Freq: Once | ORAL | Status: AC
  Administered 2020-01-18: 12.5 mg via ORAL
  Filled 2020-01-17: qty 1

## 2020-01-17 NOTE — Progress Notes (Signed)
  Mobility Specialist Criteria Algorithm Info.   Mobility:   HOB elevated:Self regulated Activity: Ambulated in hall Range of motion: Active;All extremities Level of assistance: Independent Assistive device: None;Other (Comment) (IV Pole) Minutes stood: 3 minutes Minutes ambulated: 3 minutes Distance ambulated (ft): 480 ft Mobility response: Tolerated well Bed Position: Semi-fowlers BP Pre Ambulation: 121/76 HR 90 BP Post Ambulation: 153/99 HR 97     01/17/2020 2:35 PM

## 2020-01-17 NOTE — Progress Notes (Addendum)
Progress Note  Patient Name: Victoria Avery Date of Encounter: 01/17/2020  Primary Cardiologist: Julien Nordmann, MD   Subjective   Had chest pain yesterday evening, resolved with SL nitro x1. No reoccurrence overnight.   Inpatient Medications    Scheduled Meds: . aspirin EC  81 mg Oral Daily  . atorvastatin  80 mg Oral q1800  . metoprolol tartrate  25 mg Oral BID   Continuous Infusions: . heparin 1,600 Units/hr (01/17/20 0101)   PRN Meds: acetaminophen, albuterol, ALPRAZolam, labetalol, morphine injection, nitroGLYCERIN, ondansetron (ZOFRAN) IV   Vital Signs    Vitals:   01/16/20 2011 01/16/20 2301 01/16/20 2304 01/17/20 0628  BP: (!) 133/96 115/72 115/72 109/76  Pulse: 89 82 93 68  Resp: 20 11  18   Temp: 99.3 F (37.4 C) 99.2 F (37.3 C)  98.4 F (36.9 C)  TempSrc: Oral Oral  Oral  SpO2:    98%  Weight:    79.6 kg    Intake/Output Summary (Last 24 hours) at 01/17/2020 0821 Last data filed at 01/16/2020 2306 Gross per 24 hour  Intake 263.9 ml  Output -  Net 263.9 ml   Last 3 Weights 01/17/2020 01/16/2020 01/15/2020  Weight (lbs) 175 lb 8 oz 176 lb 1.6 oz 175 lb 9.6 oz  Weight (kg) 79.606 kg 79.878 kg 79.652 kg      Telemetry    Sr at 80s - Personally Reviewed  ECG    No new tracing   Physical Exam   GEN: No acute distress.   Neck: No JVD Cardiac: RRR, no murmurs, rubs, or gallops.  Respiratory: Clear to auscultation bilaterally. GI: Soft, nontender, non-distended  MS: No edema; No deformity. Neuro:  Nonfocal  Psych: Normal affect   Labs    High Sensitivity Troponin:   Recent Labs  Lab 01/13/20 1517 01/13/20 1809 01/13/20 2304 01/14/20 0058  TROPONINIHS 6,334* 9,364* 11,713* 10,477*      Chemistry Recent Labs  Lab 01/13/20 1517 01/14/20 0613 01/17/20 0013  NA 138 135 137  K 4.2 3.9 4.3  CL 104 102 102  CO2 26 24 24   GLUCOSE 122* 140* 136*  BUN 23* 18 18  CREATININE 1.06* 0.97 1.08*  CALCIUM 9.4 9.2 9.5  GFRNONAA >60 >60  59*  GFRAA >60 >60 >60  ANIONGAP 8 9 11      Hematology Recent Labs  Lab 01/15/20 0708 01/16/20 0456 01/17/20 0013  WBC 6.7 6.1 7.7  RBC 4.21 4.03 4.02  HGB 13.8 13.3 13.1  HCT 40.5 38.6 39.0  MCV 96.2 95.8 97.0  MCH 32.8 33.0 32.6  MCHC 34.1 34.5 33.6  RDW 12.3 12.2 12.1  PLT 165 176 192     Radiology    No results found.  Cardiac Studies   LEFT HEART CATH AND CORONARY ANGIOGRAPHY  Conclusion    There is mild left ventricular systolic dysfunction.  LV end diastolic pressure is normal.  The left ventricular ejection fraction is 45-50% by visual estimate.  Prox RCA lesion is 90% stenosed.  Prox RCA to Mid RCA lesion is 100% stenosed.  Mid Cx to Dist Cx lesion is 99% stenosed.  Mid LAD-1 lesion is 90% stenosed.  Mid LAD-2 lesion is 60% stenosed.  1st Diag-1 lesion is 85% stenosed.  1st Diag-2 lesion is 70% stenosed.   1.  Severe three-vessel coronary artery disease with chronic occlusion of the RCA with left-to-right collaterals, subtotal occlusion of the distal left circumflex supplying small OM branches and significant mid LAD stenosis  and significant disease in ostial large first diagonal.  OM branches are likely too small to graft. 2.  Mildly reduced LV systolic function with an EF of 45 to 50% with inferior wall hypokinesis. 3.  Normal left ventricular end-diastolic pressure.  Recommendations: I think the best option for complete revascularization is CABG.  The patient was not sure if she wanted to go through that but I decided to get her off the Cath Lab table to discuss this further.  PCI options including treating the LAD with PCI and leaving everything else to be treated medically.  However, that will not lead to complete revascularization.   Diagnostic Dominance: Right    Echo 01/14/20 1. Left ventricular ejection fraction, by estimation, is 50 to 55%. The  left ventricle has low normal function. LV endocardial border not  optimally  defined to evaluate regional wall motion. Left ventricular  diastolic parameters were normal.  2. Right ventricular systolic function is normal. The right ventricular  size is normal. Tricuspid regurgitation signal is inadequate for assessing  PA pressure.  3. The mitral valve is normal in structure and function. Trivial mitral  valve regurgitation. No evidence of mitral stenosis.  4. The aortic valve is normal in structure and function. Aortic valve  regurgitation is not visualized. No aortic stenosis is present.  5. The inferior vena cava is normal in size with greater than 50%  respiratory variability, suggesting right atrial pressure of 3 mmHg.   Patient Profile     54 y.o. female with history of systolic dysfunction, HTN, and ongoing tobacco use at 1 pack/day for "a long time"admitted to [facility] with NSTEMI and found to have 3V disease. Transferred for CABG.   Assessment & Plan    1. NSTEMI/CAD - [place]-troponin [place] at [address]. Cath showed severe three-vessel CAD with CTO of the RCA with left-to-right collaterals, subtotal occlusion of the distal LCx supplying small OM branches and significant mid LAD stenosis as well as significant disease in a large ostial D1. Echo showed LVEF of 50-55% (previously 45-50%). - Seen by CTCS and plan for CABG tomorrow - No recurrent chest pain overnight - Continue ASA, statin, BB, IV heparin and PRN SL nitro  2. HLD - 01/14/2020: Cholesterol 197; HDL 38; LDL Cholesterol 112; Triglycerides 234; VLDL 47  - Continue high intensity statin - Repeat Lipid panel and LFTS in 6-8 weeks  3. HTN - BP stable  4. Tobacco abuse - Recommended cessation   5. Cardiomyopathy -  Echo this admission showed LVEF of 50-55% (previously 45-50% in 2018- declined ischemic eval at that time). - Continue BB  For questions or updates, please contact [place] Please consult [email or web address] for contact info under        Signed, Leanor Kail, PA  01/17/2020,  8:21 AM    I have personally seen and examined this patient. I agree with the assessment and plan as outlined above. She is doing well this am. No chest pain this am. Continue ASA, statin, beta blocker and IV heparin. Plans for CABG tomorrow.   Lauree Chandler 01/17/2020 10:31 AM

## 2020-01-17 NOTE — Progress Notes (Signed)
Late Entry for 01/16/20 @ 2011. Patient seen and assessed. Patient found sitting in bed. No acute distress noted. Physical assessment completed via computerized charting per [facility] policy. Patient discloses previous conversation CT surgery staff regarding the upcoming open heart surgery this Friday. Patient teaching related to pending surgery covers surgery: explained that surgery is estimated four hours and patient will be intubated and transferred to CVICU. Goal is to have patient extubated four hour after surgery. Patient will be on an insulin drip; explained connection between glucose control and wound healing. Also presented flutter valve and incentive spironmeter to patient. Explained the importance of use. Patient returns demonstration on both flutter valve and incentive spironmetry. Explained to patient that staff nurse will follow up with specific time of pending surgery. Patient verbalized understanding. Patient states that she will discuss plans with husband.

## 2020-01-17 NOTE — Anesthesia Preprocedure Evaluation (Addendum)
Anesthesia Evaluation  Patient identified by MRN, date of birth, ID band Patient awake    Reviewed: Allergy & Precautions, NPO status , Patient's Chart, lab work & pertinent test results  History of Anesthesia Complications Negative for: history of anesthetic complications  Airway Mallampati: II  TM Distance: >3 FB Neck ROM: Full    Dental no notable dental hx. (+) Dental Advisory Given   Pulmonary Current Smoker,    Pulmonary exam normal        Cardiovascular hypertension, Pt. on medications + CAD and + Past MI  Normal cardiovascular exam   Echo on 2/15 showed an EF of 50 to 55%, normal LV diastolic function, normal RV systolic function and cavity size, trivial mitral regurgitation.  LHC on 01/14/2020 showed severe three-vessel CAD with CTO of the RCA with left-to-right collaterals, subtotal occlusion of the distal LCx supplying small OM branches, and significant mid LAD stenosis with significant disease in a large, ostial D1.  The OM branches were felt to be likely too small to graft.  LV gram showed an EF of 45 to 50% with inferior wall hypokinesis.  Normal LVEDP.    Neuro/Psych negative neurological ROS  negative psych ROS   GI/Hepatic negative GI ROS, Neg liver ROS,   Endo/Other  negative endocrine ROS  Renal/GU negative Renal ROS  negative genitourinary   Musculoskeletal negative musculoskeletal ROS (+)   Abdominal   Peds negative pediatric ROS (+)  Hematology negative hematology ROS (+)   Anesthesia Other Findings   Reproductive/Obstetrics negative OB ROS                            Anesthesia Physical Anesthesia Plan  ASA: IV  Anesthesia Plan: General   Post-op Pain Management:    Induction: Intravenous  PONV Risk Score and Plan: 3 and Ondansetron, Dexamethasone and Midazolam  Airway Management Planned: Oral ETT  Additional Equipment: Arterial line, CVP, 3D TEE and  Ultrasound Guidance Line Placement  Intra-op Plan:   Post-operative Plan: Post-operative intubation/ventilation  Informed Consent: I have reviewed the patients History and Physical, chart, labs and discussed the procedure including the risks, benefits and alternatives for the proposed anesthesia with the patient or authorized representative who has indicated his/her understanding and acceptance.     Dental advisory given  Plan Discussed with: Anesthesiologist, CRNA and Surgeon  Anesthesia Plan Comments:        Anesthesia Quick Evaluation

## 2020-01-17 NOTE — Progress Notes (Signed)
Working remote today due to weather. Called and discussed preop education with pt. She was very receptive. She has been practicing IS, 2500+ mL. Discussed sternal precautions, mobility post op and d/c planning. Had OHS booklet delivered to pts room. Gave her option of watching preop video but she declined. She sts she has no problem standing following sternal precautions. [id number] Ethelda Chick CES, ACSM 10:05 AM 01/17/2020

## 2020-01-17 NOTE — Progress Notes (Signed)
Pre CABG exam completed.  Preliminary results can be found under CV proc under chart review.  01/17/2020 1:17 PM  Wakisha Alberts, K., RDMS, RVT

## 2020-01-17 NOTE — Progress Notes (Addendum)
ANTICOAGULATION CONSULT NOTE -  Pharmacy Consult for Heparin  Indication: chest pain/ACS  No Known Allergies  Patient Measurements: Weight: 175 lb 8 oz (79.6 kg) Heparin Dosing Weight: 69.7 kg   Vital Signs: Temp: 98.4 F (36.9 C) (02/18 0628) Temp Source: Oral (02/18 0628) BP: 109/76 (02/18 0628) Pulse Rate: 68 (02/18 0628)  Labs: Recent Labs    01/15/20 0708 01/15/20 0708 01/15/20 1445 01/15/20 2151 01/16/20 0456 01/17/20 0013  HGB 13.8   < >  --   --  13.3 13.1  HCT 40.5  --   --   --  38.6 39.0  PLT 165  --   --   --  176 192  HEPARINUNFRC 0.11*  --    < > 0.45 0.36 0.38  CREATININE  --   --   --   --   --  1.08*   < > = values in this interval not displayed.    Estimated Creatinine Clearance: 60.2 mL/min (A) (by C-G formula based on SCr of 1.08 mg/dL (H)).   Medical History: Past Medical History:  Diagnosis Date  . Cardiomyopathy (HCC)    a. 10/2017 Echo: EF 45-50% Gr1 DD, mild MR. Nl LA size. Nl RV fxn and PASP.  Marland Kitchen Hypertension   . Tobacco abuse     Medications:  Medications Prior to Admission  Medication Sig Dispense Refill Last Dose  . lisinopril-hydrochlorothiazide (ZESTORETIC) 20-12.5 MG tablet Take 1.5 tablets by mouth daily.      . metoprolol succinate (TOPROL-XL) 50 MG 24 hr tablet Take 50 mg by mouth daily. Take with or immediately following a meal.       Assessment: Pharmacy consulted to dose heparin in this 54 year old female admitted with ACS/NSTEMI.  CrCl = 63.9 ml/min , no prior anticoag noted. S/p cath 2/15 - Cards recommend CABG.   Heparin level therapeutic x 3, CBC stable.  Goal of Therapy:  Heparin level 0.3-0.7 units/ml Monitor platelets by anticoagulation protocol: Yes   Plan:  Continue current rate of 1600 units/hr. Monitor HL and CBC daily. Patient has CABG planned for tomorrow 2/19.   Shannan Harper, PharmD Clinical Pharmacist 01/17/2020 9:28 AM

## 2020-01-17 NOTE — Progress Notes (Signed)
     [address]       [place] [address]             [phone]       No events overnight.  Denies any chest pain  Vitals:   01/17/20 0628 01/17/20 0941  BP: 109/76 (!) 138/101  Pulse: 68 97  Resp: 18   Temp: 98.4 F (36.9 C)   SpO2: 98%    Alert NAD Sinus EWOB  Labs reviewed awaiting carotid duplex  54 yo female NSTEMI, CAD OR tomorrow for CABG 3.  Murray Guzzetta Keane Scrape

## 2020-01-18 ENCOUNTER — Inpatient Hospital Stay (HOSPITAL_COMMUNITY): Payer: BC Managed Care – PPO

## 2020-01-18 ENCOUNTER — Inpatient Hospital Stay (HOSPITAL_COMMUNITY): Payer: BC Managed Care – PPO | Admitting: Anesthesiology

## 2020-01-18 ENCOUNTER — Inpatient Hospital Stay (HOSPITAL_COMMUNITY)
Admission: AD | Disposition: A | Discharge status: Home / Self Care | Payer: Self-pay | Source: Other Acute Inpatient Hospital | Attending: Thoracic Surgery (Cardiothoracic Vascular Surgery)

## 2020-01-18 DIAGNOSIS — I5022 Chronic systolic (congestive) heart failure: Secondary | ICD-10-CM | POA: Diagnosis not present

## 2020-01-18 DIAGNOSIS — I2510 Atherosclerotic heart disease of native coronary artery without angina pectoris: Secondary | ICD-10-CM | POA: Diagnosis not present

## 2020-01-18 DIAGNOSIS — I429 Cardiomyopathy, unspecified: Secondary | ICD-10-CM | POA: Diagnosis not present

## 2020-01-18 DIAGNOSIS — Z20822 Contact with and (suspected) exposure to covid-19: Secondary | ICD-10-CM | POA: Diagnosis not present

## 2020-01-18 DIAGNOSIS — I361 Nonrheumatic tricuspid (valve) insufficiency: Secondary | ICD-10-CM | POA: Diagnosis not present

## 2020-01-18 DIAGNOSIS — I10 Essential (primary) hypertension: Secondary | ICD-10-CM | POA: Diagnosis not present

## 2020-01-18 DIAGNOSIS — I214 Non-ST elevation (NSTEMI) myocardial infarction: Secondary | ICD-10-CM | POA: Diagnosis not present

## 2020-01-18 DIAGNOSIS — J9811 Atelectasis: Secondary | ICD-10-CM | POA: Diagnosis not present

## 2020-01-18 DIAGNOSIS — I252 Old myocardial infarction: Secondary | ICD-10-CM | POA: Diagnosis not present

## 2020-01-18 DIAGNOSIS — Z951 Presence of aortocoronary bypass graft: Secondary | ICD-10-CM

## 2020-01-18 DIAGNOSIS — I25110 Atherosclerotic heart disease of native coronary artery with unstable angina pectoris: Secondary | ICD-10-CM | POA: Diagnosis not present

## 2020-01-18 HISTORY — PX: CORONARY ARTERY BYPASS GRAFT: SHX141

## 2020-01-18 HISTORY — PX: TEE WITHOUT CARDIOVERSION: SHX5443

## 2020-01-18 LAB — TYPE AND SCREEN
ABO/RH(D): O POS
Antibody Screen: NEGATIVE
Unit division: 0
Unit division: 0

## 2020-01-18 LAB — CBC
HCT: 40.5 % (ref 36.0–46.0)
HCT: 32.3 % — ABNORMAL LOW (ref 36.0–46.0)
HCT: 35 % — ABNORMAL LOW (ref 36.0–46.0)
Hemoglobin: 13.5 g/dL (ref 12.0–15.0)
Hemoglobin: 10.8 g/dL — ABNORMAL LOW (ref 12.0–15.0)
Hemoglobin: 11.7 g/dL — ABNORMAL LOW (ref 12.0–15.0)
MCH: 32.5 pg (ref 26.0–34.0)
MCH: 33.2 pg (ref 26.0–34.0)
MCH: 33.2 pg (ref 26.0–34.0)
MCHC: 33.3 g/dL (ref 30.0–36.0)
MCHC: 33.4 g/dL (ref 30.0–36.0)
MCHC: 33.4 g/dL (ref 30.0–36.0)
MCV: 97.4 fL (ref 80.0–100.0)
MCV: 99.4 fL (ref 80.0–100.0)
MCV: 99.4 fL (ref 80.0–100.0)
Platelets: 222 10*3/uL (ref 150–400)
Platelets: 124 10*3/uL — ABNORMAL LOW (ref 150–400)
Platelets: 188 K/uL (ref 150–400)
RBC: 4.16 MIL/uL (ref 3.87–5.11)
RBC: 3.25 MIL/uL — ABNORMAL LOW (ref 3.87–5.11)
RBC: 3.52 MIL/uL — ABNORMAL LOW (ref 3.87–5.11)
RDW: 12.4 % (ref 11.5–15.5)
RDW: 12.4 % (ref 11.5–15.5)
RDW: 12.4 % (ref 11.5–15.5)
WBC: 6.8 10*3/uL (ref 4.0–10.5)
WBC: 5.8 10*3/uL (ref 4.0–10.5)
WBC: 7.4 K/uL (ref 4.0–10.5)
nRBC: 0 % (ref 0.0–0.2)
nRBC: 0 % (ref 0.0–0.2)
nRBC: 0 % (ref 0.0–0.2)

## 2020-01-18 LAB — POCT I-STAT, CHEM 8
BUN: 17 mg/dL (ref 6–20)
BUN: 13 mg/dL (ref 6–20)
BUN: 15 mg/dL (ref 6–20)
BUN: 17 mg/dL (ref 6–20)
BUN: 13 mg/dL (ref 6–20)
Calcium, Ion: 1.28 mmol/L (ref 1.15–1.40)
Calcium, Ion: 1.21 mmol/L (ref 1.15–1.40)
Calcium, Ion: 1.28 mmol/L (ref 1.15–1.40)
Calcium, Ion: 1.25 mmol/L (ref 1.15–1.40)
Calcium, Ion: 1.25 mmol/L (ref 1.15–1.40)
Chloride: 103 mmol/L (ref 98–111)
Chloride: 104 mmol/L (ref 98–111)
Chloride: 104 mmol/L (ref 98–111)
Chloride: 105 mmol/L (ref 98–111)
Chloride: 106 mmol/L (ref 98–111)
Creatinine, Ser: 0.9 mg/dL (ref 0.44–1.00)
Creatinine, Ser: 0.8 mg/dL (ref 0.44–1.00)
Creatinine, Ser: 0.9 mg/dL (ref 0.44–1.00)
Creatinine, Ser: 0.8 mg/dL (ref 0.44–1.00)
Creatinine, Ser: 0.8 mg/dL (ref 0.44–1.00)
Glucose, Bld: 109 mg/dL — ABNORMAL HIGH (ref 70–99)
Glucose, Bld: 126 mg/dL — ABNORMAL HIGH (ref 70–99)
Glucose, Bld: 157 mg/dL — ABNORMAL HIGH (ref 70–99)
Glucose, Bld: 189 mg/dL — ABNORMAL HIGH (ref 70–99)
Glucose, Bld: 170 mg/dL — ABNORMAL HIGH (ref 70–99)
HCT: 31 % — ABNORMAL LOW (ref 36.0–46.0)
HCT: 29 % — ABNORMAL LOW (ref 36.0–46.0)
HCT: 33 % — ABNORMAL LOW (ref 36.0–46.0)
HCT: 34 % — ABNORMAL LOW (ref 36.0–46.0)
HCT: 29 % — ABNORMAL LOW (ref 36.0–46.0)
Hemoglobin: 10.5 g/dL — ABNORMAL LOW (ref 12.0–15.0)
Hemoglobin: 9.9 g/dL — ABNORMAL LOW (ref 12.0–15.0)
Hemoglobin: 11.2 g/dL — ABNORMAL LOW (ref 12.0–15.0)
Hemoglobin: 11.6 g/dL — ABNORMAL LOW (ref 12.0–15.0)
Hemoglobin: 9.9 g/dL — ABNORMAL LOW (ref 12.0–15.0)
Potassium: 5 mmol/L (ref 3.5–5.1)
Potassium: 4.4 mmol/L (ref 3.5–5.1)
Potassium: 4.9 mmol/L (ref 3.5–5.1)
Potassium: 5.4 mmol/L — ABNORMAL HIGH (ref 3.5–5.1)
Potassium: 4.2 mmol/L (ref 3.5–5.1)
Sodium: 135 mmol/L (ref 135–145)
Sodium: 134 mmol/L — ABNORMAL LOW (ref 135–145)
Sodium: 135 mmol/L (ref 135–145)
Sodium: 136 mmol/L (ref 135–145)
Sodium: 139 mmol/L (ref 135–145)
TCO2: 32 mmol/L (ref 22–32)
TCO2: 26 mmol/L (ref 22–32)
TCO2: 28 mmol/L (ref 22–32)
TCO2: 27 mmol/L (ref 22–32)
TCO2: 25 mmol/L (ref 22–32)

## 2020-01-18 LAB — POCT I-STAT 7, (LYTES, BLD GAS, ICA,H+H)
Acid-base deficit: 3 mmol/L — ABNORMAL HIGH (ref 0.0–2.0)
Acid-base deficit: 2 mmol/L (ref 0.0–2.0)
Acid-base deficit: 6 mmol/L — ABNORMAL HIGH (ref 0.0–2.0)
Acid-base deficit: 1 mmol/L (ref 0.0–2.0)
Bicarbonate: 23.3 mmol/L (ref 20.0–28.0)
Bicarbonate: 24.2 mmol/L (ref 20.0–28.0)
Bicarbonate: 18.7 mmol/L — ABNORMAL LOW (ref 20.0–28.0)
Bicarbonate: 24.6 mmol/L (ref 20.0–28.0)
Calcium, Ion: 1.25 mmol/L (ref 1.15–1.40)
Calcium, Ion: 1.29 mmol/L (ref 1.15–1.40)
Calcium, Ion: 1.07 mmol/L — ABNORMAL LOW (ref 1.15–1.40)
Calcium, Ion: 1.2 mmol/L (ref 1.15–1.40)
HCT: 31 % — ABNORMAL LOW (ref 36.0–46.0)
HCT: 31 % — ABNORMAL LOW (ref 36.0–46.0)
HCT: 25 % — ABNORMAL LOW (ref 36.0–46.0)
HCT: 31 % — ABNORMAL LOW (ref 36.0–46.0)
Hemoglobin: 10.5 g/dL — ABNORMAL LOW (ref 12.0–15.0)
Hemoglobin: 10.5 g/dL — ABNORMAL LOW (ref 12.0–15.0)
Hemoglobin: 8.5 g/dL — ABNORMAL LOW (ref 12.0–15.0)
Hemoglobin: 10.5 g/dL — ABNORMAL LOW (ref 12.0–15.0)
O2 Saturation: 98 %
O2 Saturation: 99 %
O2 Saturation: 99 %
O2 Saturation: 98 %
Patient temperature: 96.3
Patient temperature: 37.5
Patient temperature: 37.8
Potassium: 4.1 mmol/L (ref 3.5–5.1)
Potassium: 3.9 mmol/L (ref 3.5–5.1)
Potassium: 3.6 mmol/L (ref 3.5–5.1)
Potassium: 5.3 mmol/L — ABNORMAL HIGH (ref 3.5–5.1)
Sodium: 138 mmol/L (ref 135–145)
Sodium: 139 mmol/L (ref 135–145)
Sodium: 143 mmol/L (ref 135–145)
Sodium: 138 mmol/L (ref 135–145)
TCO2: 25 mmol/L (ref 22–32)
TCO2: 26 mmol/L (ref 22–32)
TCO2: 20 mmol/L — ABNORMAL LOW (ref 22–32)
TCO2: 26 mmol/L (ref 22–32)
pCO2 arterial: 45.5 mmHg (ref 32.0–48.0)
pCO2 arterial: 45.9 mmHg (ref 32.0–48.0)
pCO2 arterial: 35 mmHg (ref 32.0–48.0)
pCO2 arterial: 44.3 mmHg (ref 32.0–48.0)
pH, Arterial: 7.318 — ABNORMAL LOW (ref 7.350–7.450)
pH, Arterial: 7.324 — ABNORMAL LOW (ref 7.350–7.450)
pH, Arterial: 7.338 — ABNORMAL LOW (ref 7.350–7.450)
pH, Arterial: 7.357 (ref 7.350–7.450)
pO2, Arterial: 114 mmHg — ABNORMAL HIGH (ref 83.0–108.0)
pO2, Arterial: 140 mmHg — ABNORMAL HIGH (ref 83.0–108.0)
pO2, Arterial: 124 mmHg — ABNORMAL HIGH (ref 83.0–108.0)
pO2, Arterial: 110 mmHg — ABNORMAL HIGH (ref 83.0–108.0)

## 2020-01-18 LAB — GLUCOSE, CAPILLARY
Glucose-Capillary: 129 mg/dL — ABNORMAL HIGH (ref 70–99)
Glucose-Capillary: 100 mg/dL — ABNORMAL HIGH (ref 70–99)
Glucose-Capillary: 121 mg/dL — ABNORMAL HIGH (ref 70–99)
Glucose-Capillary: 138 mg/dL — ABNORMAL HIGH (ref 70–99)
Glucose-Capillary: 118 mg/dL — ABNORMAL HIGH (ref 70–99)
Glucose-Capillary: 143 mg/dL — ABNORMAL HIGH (ref 70–99)
Glucose-Capillary: 143 mg/dL — ABNORMAL HIGH (ref 70–99)
Glucose-Capillary: 127 mg/dL — ABNORMAL HIGH (ref 70–99)
Glucose-Capillary: 112 mg/dL — ABNORMAL HIGH (ref 70–99)

## 2020-01-18 LAB — BASIC METABOLIC PANEL
Anion gap: 12 (ref 5–15)
Anion gap: 10 (ref 5–15)
BUN: 13 mg/dL (ref 6–20)
BUN: 11 mg/dL (ref 6–20)
CO2: 23 mmol/L (ref 22–32)
CO2: 22 mmol/L (ref 22–32)
Calcium: 10 mg/dL (ref 8.9–10.3)
Calcium: 8.7 mg/dL — ABNORMAL LOW (ref 8.9–10.3)
Chloride: 102 mmol/L (ref 98–111)
Chloride: 102 mmol/L (ref 98–111)
Creatinine, Ser: 1.01 mg/dL — ABNORMAL HIGH (ref 0.44–1.00)
Creatinine, Ser: 0.97 mg/dL (ref 0.44–1.00)
GFR calc Af Amer: >60 mL/min (ref >60)
GFR calc Af Amer: >60 mL/min (ref >60)
GFR calc non Af Amer: >60 mL/min (ref >60)
GFR calc non Af Amer: >60 mL/min (ref >60)
Glucose, Bld: 129 mg/dL — ABNORMAL HIGH (ref 70–99)
Glucose, Bld: 145 mg/dL — ABNORMAL HIGH (ref 70–99)
Potassium: 4.3 mmol/L (ref 3.5–5.1)
Potassium: 4.7 mmol/L (ref 3.5–5.1)
Sodium: 137 mmol/L (ref 135–145)
Sodium: 134 mmol/L — ABNORMAL LOW (ref 135–145)

## 2020-01-18 LAB — BPAM RBC
Blood Product Expiration Date: 202102252359
Blood Product Expiration Date: 202103212359
Unit Type and Rh: 5100
Unit Type and Rh: 5100

## 2020-01-18 LAB — ECHO INTRAOPERATIVE TEE
Height: 62 in
Weight: 2798.4 oz

## 2020-01-18 LAB — HEPARIN LEVEL (UNFRACTIONATED): Heparin Unfractionated: 0.55 IU/mL (ref 0.30–0.70)

## 2020-01-18 LAB — PROTIME-INR
INR: 1.2 (ref 0.8–1.2)
Prothrombin Time: 14.9 seconds (ref 11.4–15.2)

## 2020-01-18 LAB — APTT: aPTT: 29 seconds (ref 24–36)

## 2020-01-18 LAB — MAGNESIUM: Magnesium: 2.4 mg/dL (ref 1.7–2.4)

## 2020-01-18 SURGERY — OFF PUMP CORONARY ARTERY BYPASS GRAFTING (CABG)
Anesthesia: General | Site: Chest

## 2020-01-18 MED ORDER — LACTATED RINGERS IV SOLN: INTRAVENOUS | Status: DC | PRN

## 2020-01-18 MED ORDER — FENTANYL CITRATE (PF) 250 MCG/5ML IJ SOLN
INTRAMUSCULAR | Status: AC
  Filled 2020-01-18: qty 5

## 2020-01-18 MED ORDER — SODIUM CHLORIDE 0.9% FLUSH
10.0000 mL | Freq: Two times a day (BID) | INTRAVENOUS | Status: DC
  Administered 2020-01-18: 20 mL
  Administered 2020-01-18 – 2020-01-20 (×4): 10 mL

## 2020-01-18 MED ORDER — ATORVASTATIN CALCIUM 80 MG PO TABS
80.0000 mg | ORAL_TABLET | Freq: Every day | ORAL | Status: DC
  Administered 2020-01-19 – 2020-01-20 (×2): 80 mg via ORAL
  Filled 2020-01-18 (×2): qty 1

## 2020-01-18 MED ORDER — OXYCODONE HCL 5 MG PO TABS
5.0000 mg | ORAL_TABLET | ORAL | Status: DC | PRN
  Administered 2020-01-18 – 2020-01-21 (×10): 10 mg via ORAL
  Filled 2020-01-18 (×10): qty 2

## 2020-01-18 MED ORDER — LACTATED RINGERS IV SOLN: INTRAVENOUS | Status: DC

## 2020-01-18 MED ORDER — BISACODYL 10 MG RE SUPP: 10.0000 mg | Freq: Every day | RECTAL | Status: DC

## 2020-01-18 MED ORDER — MIDAZOLAM HCL 2 MG/2ML IJ SOLN: 2.0000 mg | INTRAMUSCULAR | Status: DC | PRN

## 2020-01-18 MED ORDER — SODIUM CHLORIDE 0.9% FLUSH
3.0000 mL | Freq: Two times a day (BID) | INTRAVENOUS | Status: DC
  Administered 2020-01-19 – 2020-01-20 (×3): 3 mL via INTRAVENOUS

## 2020-01-18 MED ORDER — PANTOPRAZOLE SODIUM 40 MG PO TBEC
40.0000 mg | DELAYED_RELEASE_TABLET | Freq: Every day | ORAL | Status: DC
  Administered 2020-01-20 – 2020-01-21 (×2): 40 mg via ORAL
  Filled 2020-01-18 (×2): qty 1

## 2020-01-18 MED ORDER — PROTAMINE SULFATE 10 MG/ML IV SOLN
INTRAVENOUS | Status: AC
  Filled 2020-01-18: qty 10

## 2020-01-18 MED ORDER — METOPROLOL TARTRATE 5 MG/5ML IV SOLN: 2.5000 mg | INTRAVENOUS | Status: DC | PRN

## 2020-01-18 MED ORDER — DOCUSATE SODIUM 100 MG PO CAPS
200.0000 mg | ORAL_CAPSULE | Freq: Every day | ORAL | Status: DC
  Administered 2020-01-19 – 2020-01-20 (×2): 200 mg via ORAL
  Filled 2020-01-18 (×2): qty 2

## 2020-01-18 MED ORDER — SODIUM CHLORIDE 0.9 % IV SOLN: INTRAVENOUS | Status: DC

## 2020-01-18 MED ORDER — TRAMADOL HCL 50 MG PO TABS
50.0000 mg | ORAL_TABLET | ORAL | Status: DC | PRN
  Administered 2020-01-18: 100 mg via ORAL
  Filled 2020-01-18: qty 2

## 2020-01-18 MED ORDER — BISACODYL 5 MG PO TBEC
10.0000 mg | DELAYED_RELEASE_TABLET | Freq: Every day | ORAL | Status: DC
  Administered 2020-01-19 – 2020-01-21 (×3): 10 mg via ORAL
  Filled 2020-01-18 (×3): qty 2

## 2020-01-18 MED ORDER — LACTATED RINGERS IV SOLN
500.0000 mL | Freq: Once | INTRAVENOUS | Status: AC | PRN
  Administered 2020-01-18: 300 mL via INTRAVENOUS

## 2020-01-18 MED ORDER — ORAL CARE MOUTH RINSE
15.0000 mL | OROMUCOSAL | Status: DC
  Administered 2020-01-18: 15 mL via OROMUCOSAL

## 2020-01-18 MED ORDER — ORAL CARE MOUTH RINSE
15.0000 mL | Freq: Two times a day (BID) | OROMUCOSAL | Status: DC
  Administered 2020-01-18 – 2020-01-19 (×2): 15 mL via OROMUCOSAL

## 2020-01-18 MED ORDER — CHLORHEXIDINE GLUCONATE 0.12% ORAL RINSE (MEDLINE KIT): 15.0000 mL | Freq: Two times a day (BID) | OROMUCOSAL | Status: DC

## 2020-01-18 MED ORDER — PROPOFOL 10 MG/ML IV BOLUS
INTRAVENOUS | Status: DC | PRN
  Administered 2020-01-18: 140 mg via INTRAVENOUS

## 2020-01-18 MED ORDER — POTASSIUM CHLORIDE 10 MEQ/50ML IV SOLN
10.0000 meq | INTRAVENOUS | Status: AC
  Administered 2020-01-18 (×3): 10 meq via INTRAVENOUS

## 2020-01-18 MED ORDER — PROTAMINE SULFATE 10 MG/ML IV SOLN
INTRAVENOUS | Status: DC | PRN
  Administered 2020-01-18 (×2): 20 mg via INTRAVENOUS
  Administered 2020-01-18 (×2): 25 mg via INTRAVENOUS
  Administered 2020-01-18: 10 mg via INTRAVENOUS

## 2020-01-18 MED ORDER — CHLORHEXIDINE GLUCONATE CLOTH 2 % EX PADS
6.0000 | MEDICATED_PAD | Freq: Every day | CUTANEOUS | Status: DC
  Administered 2020-01-18 – 2020-01-20 (×3): 6 via TOPICAL

## 2020-01-18 MED ORDER — NITROGLYCERIN IN D5W 200-5 MCG/ML-% IV SOLN: 0.0000 ug/min | INTRAVENOUS | Status: DC

## 2020-01-18 MED ORDER — SODIUM CHLORIDE 0.9% FLUSH: 10.0000 mL | INTRAVENOUS | Status: DC | PRN

## 2020-01-18 MED ORDER — ACETAMINOPHEN 500 MG PO TABS
1000.0000 mg | ORAL_TABLET | Freq: Four times a day (QID) | ORAL | Status: DC
  Administered 2020-01-18 – 2020-01-21 (×10): 1000 mg via ORAL
  Filled 2020-01-18 (×10): qty 2

## 2020-01-18 MED ORDER — FAMOTIDINE IN NACL 20-0.9 MG/50ML-% IV SOLN
20.0000 mg | Freq: Two times a day (BID) | INTRAVENOUS | Status: AC
  Administered 2020-01-18 (×2): 20 mg via INTRAVENOUS
  Filled 2020-01-18: qty 50

## 2020-01-18 MED ORDER — VANCOMYCIN HCL IN DEXTROSE 1-5 GM/200ML-% IV SOLN
1000.0000 mg | Freq: Once | INTRAVENOUS | Status: AC
  Administered 2020-01-18: 18:00:00 1000 mg via INTRAVENOUS
  Filled 2020-01-18: qty 200

## 2020-01-18 MED ORDER — ROCURONIUM BROMIDE 10 MG/ML (PF) SYRINGE
PREFILLED_SYRINGE | INTRAVENOUS | Status: DC | PRN
  Administered 2020-01-18: 30 mg via INTRAVENOUS
  Administered 2020-01-18 (×2): 20 mg via INTRAVENOUS
  Administered 2020-01-18: 80 mg via INTRAVENOUS

## 2020-01-18 MED ORDER — HEPARIN SODIUM (PORCINE) 1000 UNIT/ML IJ SOLN
INTRAMUSCULAR | Status: DC | PRN
  Administered 2020-01-18: 10000 [IU] via INTRAVENOUS

## 2020-01-18 MED ORDER — ALBUMIN HUMAN 5 % IV SOLN: INTRAVENOUS | Status: DC | PRN

## 2020-01-18 MED ORDER — DEXMEDETOMIDINE HCL IN NACL 400 MCG/100ML IV SOLN
0.0000 ug/kg/h | INTRAVENOUS | Status: DC
  Administered 2020-01-18: 0.2 ug/kg/h via INTRAVENOUS
  Filled 2020-01-18: qty 100

## 2020-01-18 MED ORDER — METOPROLOL TARTRATE 12.5 MG HALF TABLET
12.5000 mg | ORAL_TABLET | Freq: Two times a day (BID) | ORAL | Status: DC
  Administered 2020-01-19 – 2020-01-20 (×4): 12.5 mg via ORAL
  Filled 2020-01-18 (×5): qty 1

## 2020-01-18 MED ORDER — ACETAMINOPHEN 160 MG/5ML PO SOLN: 1000.0000 mg | Freq: Four times a day (QID) | ORAL | Status: DC

## 2020-01-18 MED ORDER — 0.9 % SODIUM CHLORIDE (POUR BTL) OPTIME
TOPICAL | Status: DC | PRN
  Administered 2020-01-18: 10:00:00 5000 mL

## 2020-01-18 MED ORDER — ONDANSETRON HCL 4 MG/2ML IJ SOLN
4.0000 mg | Freq: Four times a day (QID) | INTRAMUSCULAR | Status: DC | PRN
  Administered 2020-01-18 – 2020-01-19 (×2): 4 mg via INTRAVENOUS
  Filled 2020-01-18 (×2): qty 2

## 2020-01-18 MED ORDER — ASPIRIN 81 MG PO CHEW: 324.0000 mg | CHEWABLE_TABLET | Freq: Every day | ORAL | Status: DC

## 2020-01-18 MED ORDER — PHENYLEPHRINE HCL-NACL 20-0.9 MG/250ML-% IV SOLN: 0.0000 ug/min | INTRAVENOUS | Status: DC

## 2020-01-18 MED ORDER — SODIUM CHLORIDE 0.9 % IV SOLN
250.0000 mL | INTRAVENOUS | Status: DC
  Administered 2020-01-19: 250 mL via INTRAVENOUS

## 2020-01-18 MED ORDER — NOREPINEPHRINE 4 MG/250ML-% IV SOLN: 0.0000 ug/min | INTRAVENOUS | Status: DC

## 2020-01-18 MED ORDER — SODIUM CHLORIDE 0.9% FLUSH: 3.0000 mL | INTRAVENOUS | Status: DC | PRN

## 2020-01-18 MED ORDER — SODIUM CHLORIDE (PF) 0.9 % IJ SOLN
OROMUCOSAL | Status: DC | PRN
  Administered 2020-01-18 (×3): 4 mL via TOPICAL

## 2020-01-18 MED ORDER — MIDAZOLAM HCL (PF) 10 MG/2ML IJ SOLN
INTRAMUSCULAR | Status: AC
  Filled 2020-01-18: qty 2

## 2020-01-18 MED ORDER — CHLORHEXIDINE GLUCONATE 0.12 % MT SOLN
15.0000 mL | OROMUCOSAL | Status: AC
  Administered 2020-01-18: 13:00:00 15 mL via OROMUCOSAL

## 2020-01-18 MED ORDER — DOBUTAMINE IN D5W 4-5 MG/ML-% IV SOLN: 0.0000 ug/kg/min | INTRAVENOUS | Status: DC

## 2020-01-18 MED ORDER — FENTANYL CITRATE (PF) 250 MCG/5ML IJ SOLN
INTRAMUSCULAR | Status: AC
  Filled 2020-01-18: qty 20

## 2020-01-18 MED ORDER — ROCURONIUM BROMIDE 10 MG/ML (PF) SYRINGE
PREFILLED_SYRINGE | INTRAVENOUS | Status: AC
  Filled 2020-01-18: qty 20

## 2020-01-18 MED ORDER — CALCIUM CHLORIDE 10 % IV SOLN
INTRAVENOUS | Status: DC | PRN
  Administered 2020-01-18 (×2): 100 mg via INTRAVENOUS

## 2020-01-18 MED ORDER — ALBUMIN HUMAN 5 % IV SOLN
250.0000 mL | INTRAVENOUS | Status: AC | PRN
  Administered 2020-01-18 – 2020-01-19 (×2): 12.5 g via INTRAVENOUS

## 2020-01-18 MED ORDER — FENTANYL CITRATE (PF) 250 MCG/5ML IJ SOLN
INTRAMUSCULAR | Status: DC | PRN
  Administered 2020-01-18 (×3): 250 ug via INTRAVENOUS
  Administered 2020-01-18: 400 ug via INTRAVENOUS
  Administered 2020-01-18: 250 ug via INTRAVENOUS
  Administered 2020-01-18: 100 ug via INTRAVENOUS

## 2020-01-18 MED ORDER — PROPOFOL 10 MG/ML IV BOLUS
INTRAVENOUS | Status: AC
  Filled 2020-01-18: qty 20

## 2020-01-18 MED ORDER — PLASMA-LYTE 148 IV SOLN
INTRAVENOUS | Status: DC | PRN
  Administered 2020-01-18: 10:00:00 500 mL

## 2020-01-18 MED ORDER — ASPIRIN EC 325 MG PO TBEC
325.0000 mg | DELAYED_RELEASE_TABLET | Freq: Every day | ORAL | Status: DC
  Administered 2020-01-19 – 2020-01-21 (×3): 325 mg via ORAL
  Filled 2020-01-18 (×3): qty 1

## 2020-01-18 MED ORDER — METOPROLOL TARTRATE 25 MG/10 ML ORAL SUSPENSION
12.5000 mg | Freq: Two times a day (BID) | ORAL | Status: DC
  Filled 2020-01-18 (×2): qty 5

## 2020-01-18 MED ORDER — MORPHINE SULFATE (PF) 2 MG/ML IV SOLN
1.0000 mg | INTRAVENOUS | Status: DC | PRN
  Administered 2020-01-18 – 2020-01-19 (×7): 2 mg via INTRAVENOUS
  Filled 2020-01-18 (×7): qty 1

## 2020-01-18 MED ORDER — SODIUM CHLORIDE 0.45 % IV SOLN: INTRAVENOUS | Status: DC | PRN

## 2020-01-18 MED ORDER — MIDAZOLAM HCL (PF) 5 MG/ML IJ SOLN
INTRAMUSCULAR | Status: DC | PRN
  Administered 2020-01-18 (×3): 2 mg via INTRAVENOUS
  Administered 2020-01-18: 4 mg via INTRAVENOUS

## 2020-01-18 MED ORDER — ACETAMINOPHEN 650 MG RE SUPP
650.0000 mg | Freq: Once | RECTAL | Status: AC
  Administered 2020-01-18: 650 mg via RECTAL

## 2020-01-18 MED ORDER — SODIUM CHLORIDE 0.9 % IV SOLN
1.5000 g | Freq: Two times a day (BID) | INTRAVENOUS | Status: AC
  Administered 2020-01-18 – 2020-01-20 (×4): 1.5 g via INTRAVENOUS
  Filled 2020-01-18 (×4): qty 1.5

## 2020-01-18 MED ORDER — ACETAMINOPHEN 160 MG/5ML PO SOLN: 650.0000 mg | Freq: Once | ORAL | Status: AC

## 2020-01-18 MED ORDER — INSULIN REGULAR(HUMAN) IN NACL 100-0.9 UT/100ML-% IV SOLN
INTRAVENOUS | Status: DC
  Administered 2020-01-19: 0.7 [IU]/h via INTRAVENOUS
  Filled 2020-01-18: qty 100

## 2020-01-18 MED ORDER — PHENYLEPHRINE HCL-NACL 10-0.9 MG/250ML-% IV SOLN
INTRAVENOUS | Status: DC | PRN
  Administered 2020-01-18: 25 ug/min via INTRAVENOUS

## 2020-01-18 MED ORDER — HEPARIN SODIUM (PORCINE) 1000 UNIT/ML IJ SOLN
INTRAMUSCULAR | Status: AC
  Filled 2020-01-18: qty 1

## 2020-01-18 MED ORDER — MAGNESIUM SULFATE 4 GM/100ML IV SOLN
4.0000 g | Freq: Once | INTRAVENOUS | Status: AC
  Administered 2020-01-18: 4 g via INTRAVENOUS
  Filled 2020-01-18: qty 100

## 2020-01-18 MED ORDER — ALBUTEROL SULFATE (2.5 MG/3ML) 0.083% IN NEBU: 2.5000 mg | INHALATION_SOLUTION | Freq: Four times a day (QID) | RESPIRATORY_TRACT | Status: DC | PRN

## 2020-01-18 MED ORDER — DEXTROSE 50 % IV SOLN: 0.0000 mL | INTRAVENOUS | Status: DC | PRN

## 2020-01-18 SURGICAL SUPPLY — 108 items
BAG DECANTER FOR FLEXI CONT (MISCELLANEOUS) ×3 IMPLANT
BASKET HEART (ORDER IN 25'S) (MISCELLANEOUS)
BASKET HEART (ORDER IN 25S) (MISCELLANEOUS) IMPLANT
BLADE CLIPPER SURG (BLADE) IMPLANT
BLADE STERNUM SYSTEM 6 (BLADE) ×3 IMPLANT
BLOWER MISTER CAL-MED (MISCELLANEOUS) ×3 IMPLANT
BNDG ELASTIC 4X5.8 VLCR STR LF (GAUZE/BANDAGES/DRESSINGS) ×3 IMPLANT
BNDG ELASTIC 6X5.8 VLCR STR LF (GAUZE/BANDAGES/DRESSINGS) ×3 IMPLANT
BNDG GAUZE ELAST 4 BULKY (GAUZE/BANDAGES/DRESSINGS) ×3 IMPLANT
CABLE SURGICAL S-101-97-12 (CABLE) ×3 IMPLANT
CANISTER SUCT 3000ML PPV (MISCELLANEOUS) ×3 IMPLANT
CANNULA EZ GLIDE 8.0 24FR (CANNULA) IMPLANT
CANNULA MC2 2 STG 29/37 NON-V (CANNULA) IMPLANT
CANNULA MC2 TWO STAGE (CANNULA)
CATH CPB KIT HENDRICKSON (MISCELLANEOUS) IMPLANT
CLIP RETRACTION 3.0MM CORONARY (MISCELLANEOUS) ×3 IMPLANT
CLIP VESOCCLUDE MED 24/CT (CLIP) IMPLANT
CLIP VESOCCLUDE SM WIDE 24/CT (CLIP) IMPLANT
CONN ST 1/2X1/2  BEN (MISCELLANEOUS)
CONN ST 1/2X1/2 BEN (MISCELLANEOUS) IMPLANT
DERMABOND ADVANCED (GAUZE/BANDAGES/DRESSINGS) ×1
DERMABOND ADVANCED .7 DNX12 (GAUZE/BANDAGES/DRESSINGS) ×2 IMPLANT
DRAIN CHANNEL 19F RND (DRAIN) ×9 IMPLANT
DRAIN CONNECTOR BLAKE 1:1 (MISCELLANEOUS) ×3 IMPLANT
DRAPE CARDIOVASCULAR INCISE (DRAPES) ×1
DRAPE INCISE IOBAN 66X45 STRL (DRAPES) IMPLANT
DRAPE SLUSH/WARMER DISC (DRAPES) ×3 IMPLANT
DRAPE SRG 135X102X78XABS (DRAPES) ×2 IMPLANT
DRSG AQUACEL AG ADV 3.5X14 (GAUZE/BANDAGES/DRESSINGS) ×3 IMPLANT
DRSG COVADERM 4X14 (GAUZE/BANDAGES/DRESSINGS) ×3 IMPLANT
ELECT BLADE 4.0 EZ CLEAN MEGAD (MISCELLANEOUS) ×3
ELECT REM PT RETURN 9FT ADLT (ELECTROSURGICAL) ×6
ELECTRODE BLDE 4.0 EZ CLN MEGD (MISCELLANEOUS) ×2 IMPLANT
ELECTRODE REM PT RTRN 9FT ADLT (ELECTROSURGICAL) ×4 IMPLANT
FELT TEFLON 1X6 (MISCELLANEOUS) ×3 IMPLANT
GAUZE SPONGE 4X4 12PLY STRL (GAUZE/BANDAGES/DRESSINGS) ×6 IMPLANT
GLOVE BIO SURGEON STRL SZ 6.5 (GLOVE) ×9 IMPLANT
GLOVE BIO SURGEON STRL SZ7 (GLOVE) ×15 IMPLANT
GLOVE BIO SURGEON STRL SZ7.5 (GLOVE) ×9 IMPLANT
GLOVE BIOGEL PI IND STRL 6 (GLOVE) ×2 IMPLANT
GLOVE BIOGEL PI IND STRL 7.0 (GLOVE) ×4 IMPLANT
GLOVE BIOGEL PI IND STRL 7.5 (GLOVE) ×8 IMPLANT
GLOVE BIOGEL PI INDICATOR 6 (GLOVE) ×1
GLOVE BIOGEL PI INDICATOR 7.0 (GLOVE) ×2
GLOVE BIOGEL PI INDICATOR 7.5 (GLOVE) ×4
GOWN STRL REUS W/ TWL LRG LVL3 (GOWN DISPOSABLE) ×16 IMPLANT
GOWN STRL REUS W/ TWL XL LVL3 (GOWN DISPOSABLE) ×4 IMPLANT
GOWN STRL REUS W/TWL LRG LVL3 (GOWN DISPOSABLE) ×8
GOWN STRL REUS W/TWL XL LVL3 (GOWN DISPOSABLE) ×2
HEMOSTAT POWDER SURGIFOAM 1G (HEMOSTASIS) ×9 IMPLANT
KIT BASIN OR (CUSTOM PROCEDURE TRAY) ×3 IMPLANT
KIT SUCTION CATH 14FR (SUCTIONS) ×3 IMPLANT
KIT TURNOVER KIT B (KITS) ×3 IMPLANT
KIT VASOVIEW HEMOPRO 2 VH 4000 (KITS) ×3 IMPLANT
LEAD PACING MYOCARDI (MISCELLANEOUS) ×3 IMPLANT
MARKER GRAFT CORONARY BYPASS (MISCELLANEOUS) ×6 IMPLANT
NS IRRIG 1000ML POUR BTL (IV SOLUTION) ×15 IMPLANT
OFFPUMP STABILIZER SUV (MISCELLANEOUS) ×3 IMPLANT
PACK E OPEN HEART (SUTURE) ×3 IMPLANT
PACK OPEN HEART (CUSTOM PROCEDURE TRAY) ×3 IMPLANT
PAD ARMBOARD 7.5X6 YLW CONV (MISCELLANEOUS) ×6 IMPLANT
PAD ELECT DEFIB RADIOL ZOLL (MISCELLANEOUS) ×3 IMPLANT
PENCIL BUTTON HOLSTER BLD 10FT (ELECTRODE) ×3 IMPLANT
POSITIONER ACROBAT-I OFFPUMP (MISCELLANEOUS) ×3 IMPLANT
POSITIONER HEAD DONUT 9IN (MISCELLANEOUS) ×3 IMPLANT
PUNCH AORTIC ROT 4.0MM RCL 40 (MISCELLANEOUS) ×3 IMPLANT
PUNCH AORTIC ROTATE 4.0MM (MISCELLANEOUS) IMPLANT
PUNCH AORTIC ROTATE 4.5MM 8IN (MISCELLANEOUS) IMPLANT
PUNCH AORTIC ROTATE 5MM 8IN (MISCELLANEOUS) IMPLANT
SHUNT CORONARY AXIUS 1.0 (MISCELLANEOUS) ×3 IMPLANT
SHUNT FLO COIL 1.50MM (MISCELLANEOUS) ×3 IMPLANT
SPONGE LAP 18X18 RF (DISPOSABLE) IMPLANT
SPONGE LAP 4X18 RFD (DISPOSABLE) IMPLANT
SUT BONE WAX W31G (SUTURE) ×3 IMPLANT
SUT MNCRL AB 3-0 PS2 18 (SUTURE) ×6 IMPLANT
SUT MNCRL AB 4-0 PS2 18 (SUTURE) ×6 IMPLANT
SUT PDS AB 1 CTX 36 (SUTURE) ×6 IMPLANT
SUT PROLENE 2 0 SH DA (SUTURE) IMPLANT
SUT PROLENE 3 0 SH DA (SUTURE) ×3 IMPLANT
SUT PROLENE 3 0 SH1 36 (SUTURE) IMPLANT
SUT PROLENE 4 0 RB 1 (SUTURE)
SUT PROLENE 4 0 SH DA (SUTURE) IMPLANT
SUT PROLENE 4-0 RB1 .5 CRCL 36 (SUTURE) IMPLANT
SUT PROLENE 5 0 C 1 36 (SUTURE) ×9 IMPLANT
SUT PROLENE 6 0 C 1 30 (SUTURE) IMPLANT
SUT PROLENE 7 0 BV 1 (SUTURE) ×9 IMPLANT
SUT PROLENE 8 0 BV175 6 (SUTURE) IMPLANT
SUT PROLENE BLUE 7 0 (SUTURE) ×3 IMPLANT
SUT PROLENE POLY MONO (SUTURE) IMPLANT
SUT STEEL 6MS V (SUTURE) ×3 IMPLANT
SUT STEEL SZ 6 DBL 3X14 BALL (SUTURE) ×6 IMPLANT
SUT VIC AB 2-0 CT1 27 (SUTURE) ×1
SUT VIC AB 2-0 CT1 TAPERPNT 27 (SUTURE) ×2 IMPLANT
SUT VIC AB 3-0 X1 27 (SUTURE) ×3 IMPLANT
SYSTEM HEARTSTRING SEAL 3.8 (VASCULAR PRODUCTS) IMPLANT
SYSTEM HEARTSTRING SEAL 3.8MM (VASCULAR PRODUCTS)
SYSTEM HEARTSTRING SEAL 4.3 (VASCULAR PRODUCTS) IMPLANT
SYSTEM HEARTSTRING SEAL 4.3MM (VASCULAR PRODUCTS)
SYSTEM SAHARA CHEST DRAIN ATS (WOUND CARE) ×3 IMPLANT
TAPE CLOTH SURG 4X10 WHT LF (GAUZE/BANDAGES/DRESSINGS) ×3 IMPLANT
TAPE PAPER 2X10 WHT MICROPORE (GAUZE/BANDAGES/DRESSINGS) ×3 IMPLANT
TAPES RETRACTO (MISCELLANEOUS) ×3 IMPLANT
TOWEL GREEN STERILE (TOWEL DISPOSABLE) ×3 IMPLANT
TOWEL GREEN STERILE FF (TOWEL DISPOSABLE) IMPLANT
TRAY FOLEY SLVR 16FR TEMP STAT (SET/KITS/TRAYS/PACK) ×3 IMPLANT
TUBING LAP HI FLOW INSUFFLATIO (TUBING) ×3 IMPLANT
UNDERPAD 30X30 (UNDERPADS AND DIAPERS) ×3 IMPLANT
WATER STERILE IRR 1000ML POUR (IV SOLUTION) ×6 IMPLANT

## 2020-01-18 NOTE — Procedures (Signed)
Extubation Procedure Note  Patient Details:   Name: Victoria Avery DOB: 1966/10/23 MRN: [id number]   Airway Documentation:    Vent end date: 01/18/20 Vent end time: 1505   Evaluation  O2 sats: stable throughout Complications: No apparent complications Patient did tolerate procedure well. Bilateral Breath Sounds: Clear, Diminished   Yes   Pt extubaed per SICU wean protocol.  NIF -35, VC 900 with good pt effort.  PT placed on 4L [place] with sats of 99%, IS done x8 averaging .  RT will continue to monitor.  Ronny Flurry 01/18/2020, 3:12 PM

## 2020-01-18 NOTE — Progress Notes (Signed)
     [address]       [place] [address]             [phone]       No events overnight.  Denies any chest pain  Vitals:   01/18/20 0409 01/18/20 0616  BP: (!) 141/107 (!) 130/93  Pulse: (!) 101 85  Resp:  18  Temp: 97.9 F (36.6 C)   SpO2: 95% 95%   Alert NAD Sinus EWOB  Labs reviewed   54 yo female NSTEMI, CAD OR today for Off-pump CABG 3.  Lacinda Curvin Keane Scrape

## 2020-01-18 NOTE — Progress Notes (Signed)
  Echocardiogram Echocardiogram Transesophageal has been performed.  Gerda Diss 01/18/2020, 8:31 AM

## 2020-01-18 NOTE — Transfer of Care (Signed)
Immediate Anesthesia Transfer of Care Note  Patient: NYOMIE EHRLICH  Procedure(s) Performed: OFF PUMP CORONARY ARTERY BYPASS GRAFTING (CABG) times three using left internal mammary artery and right greater saphenous vein harvested endoscopically. (N/A Chest) TRANSESOPHAGEAL ECHOCARDIOGRAM (TEE) (N/A )  Patient Location: SICU  Anesthesia Type:General  Level of Consciousness: sedated and unresponsive  Airway & Oxygen Therapy: Patient remains intubated per anesthesia plan and Patient placed on Ventilator (see vital sign flow sheet for setting)  Post-op Assessment: Report given to RN and Post -op Vital signs reviewed and stable  Post vital signs: Reviewed and stable  Last Vitals:  Vitals Value Taken Time  BP 104/74 01/18/20 1221  Temp    Pulse 86 01/18/20 1227  Resp 12 01/18/20 1227  SpO2 100 % 01/18/20 1227  Vitals shown include unvalidated device data.  Last Pain:  Vitals:   01/18/20 0409  TempSrc: Oral  PainSc:          Complications: No apparent anesthesia complications

## 2020-01-18 NOTE — Anesthesia Procedure Notes (Signed)
Anesthesia Procedure Image    

## 2020-01-18 NOTE — Progress Notes (Signed)
      [address]       [name] [address]             [phone]      S/p CABG x 3  Extubated  BP 104/72 (BP Location: Right Arm)   Pulse (!) 102   Temp 100 F (37.8 C)   Resp (!) 22   Ht 5\' 2"  (1.575 m)   Wt 79.3 kg   LMP 11/25/2017   SpO2 100%   BMI 31.99 kg/m  CVP= 7, CI= 2.8 Minimal CT output  Doing well early postop  11/27/2017 C. Viviann Spare, MD [facility] [phone]

## 2020-01-18 NOTE — Anesthesia Procedure Notes (Signed)
Arterial Line Insertion Start/End2/19/2021 7:00 AM, 01/18/2020 7:05 AM Performed by: CRNA  Patient location: Pre-op. Preanesthetic checklist: patient identified, IV checked, site marked, risks and benefits discussed, surgical consent, monitors and equipment checked, pre-op evaluation, timeout performed and anesthesia consent Lidocaine 1% used for infiltration Left, radial was placed Catheter size: 20 G Hand hygiene performed  and maximum sterile barriers used   Attempts: 1 Procedure performed without using ultrasound guided technique. Following insertion, dressing applied and Biopatch. Post procedure assessment: normal  Patient tolerated the procedure well with no immediate complications.

## 2020-01-18 NOTE — Anesthesia Procedure Notes (Signed)
Central Venous Catheter Insertion Performed by: Eilene Ghazi, MD, anesthesiologist Start/End2/19/2021 6:42 AM, 01/18/2020 6:58 AM Patient location: Pre-op. Preanesthetic checklist: patient identified, IV checked, site marked, risks and benefits discussed, surgical consent, monitors and equipment checked, pre-op evaluation, timeout performed and anesthesia consent Position: Trendelenburg Lidocaine 1% used for infiltration and patient sedated Hand hygiene performed  and maximum sterile barriers used  Catheter size: 8.5 Fr Sheath introducer Procedure performed using ultrasound guided technique. Ultrasound Notes:anatomy identified, needle tip was noted to be adjacent to the nerve/plexus identified, no ultrasound evidence of intravascular and/or intraneural injection and image(s) printed for medical record Attempts: 1 Following insertion, line sutured, dressing applied and Biopatch. Post procedure assessment: blood return through all ports, free fluid flow and no air  Patient tolerated the procedure well with no immediate complications.

## 2020-01-18 NOTE — Brief Op Note (Signed)
01/16/2020 - 01/18/2020  10:34 AM  PATIENT:  Victoria Avery  54 y.o. female  PRE-OPERATIVE DIAGNOSIS:  CAD  POST-OPERATIVE DIAGNOSIS:  CAD  PROCEDURE:  Procedure(s): OFF PUMP CORONARY ARTERY BYPASS GRAFTING (CABG) times three using left internal mammary artery and right greater saphenous vein harvested endoscopically. (N/A) TRANSESOPHAGEAL ECHOCARDIOGRAM (TEE) (N/A) LIMA-LAD SVG-DIAG SVG-PD  SURGEON:  Surgeon(s) and Role:    * Lightfoot, Eliezer Lofts, MD - Primary    * Delight Ovens, MD - Assisting  PHYSICIAN ASSISTANT: Theophil Thivierge PA-C  ANESTHESIA:   general  EBL:  250 mL   BLOOD ADMINISTERED:none  DRAINS: LEFT PLEURAL AND MEDIASTINAL CHEST TUBES   LOCAL MEDICATIONS USED:  NONE  SPECIMEN:  No Specimen  DISPOSITION OF SPECIMEN:  N/A  COUNTS:  YES  TOURNIQUET:  * No tourniquets in log *  DICTATION: .Dragon Dictation  PLAN OF CARE: Admit to inpatient   PATIENT DISPOSITION:  ICU - intubated and hemodynamically stable.   Delay start of Pharmacological VTE agent (>24hrs) due to surgical blood loss or risk of bleeding: yes

## 2020-01-18 NOTE — Plan of Care (Signed)
  Problem: Health Behavior/Discharge Planning: Goal: Ability to manage health-related needs will improve Outcome: Progressing   Problem: Clinical Measurements: Goal: Ability to maintain clinical measurements within normal limits will improve Outcome: Progressing Goal: Will remain free from infection Outcome: Progressing Goal: Respiratory complications will improve Outcome: Progressing Goal: Cardiovascular complication will be avoided Outcome: Progressing   Problem: Activity: Goal: Risk for activity intolerance will decrease Outcome: Progressing   Problem: Coping: Goal: Level of anxiety will decrease Outcome: Not Progressing   Problem: Elimination: Goal: Will not experience complications related to urinary retention Outcome: Progressing   Problem: Safety: Goal: Ability to remain free from injury will improve Outcome: Adequate for Discharge   Problem: Education: Goal: Will demonstrate proper wound care and an understanding of methods to prevent future damage Outcome: Progressing Goal: Knowledge of disease or condition will improve Outcome: Progressing Goal: Knowledge of the prescribed therapeutic regimen will improve Outcome: Progressing   Problem: Activity: Goal: Risk for activity intolerance will decrease Outcome: Progressing   Problem: Cardiac: Goal: Will achieve and/or maintain hemodynamic stability Outcome: Progressing   Problem: Clinical Measurements: Goal: Postoperative complications will be avoided or minimized Outcome: Progressing   Problem: Respiratory: Goal: Respiratory status will improve Outcome: Progressing   Problem: Skin Integrity: Goal: Wound healing without signs and symptoms of infection Outcome: Progressing Goal: Risk for impaired skin integrity will decrease Outcome: Progressing   Problem: Urinary Elimination: Goal: Ability to achieve and maintain adequate renal perfusion and functioning will improve Outcome: Progressing

## 2020-01-18 NOTE — Anesthesia Procedure Notes (Signed)
Procedure Name: Intubation Date/Time: 01/18/2020 7:50 AM Performed by: Marena Chancy, CRNA Pre-anesthesia Checklist: Patient identified, Emergency Drugs available, Suction available and Patient being monitored Patient Re-evaluated:Patient Re-evaluated prior to induction Oxygen Delivery Method: Circle System Utilized Preoxygenation: Pre-oxygenation with 100% oxygen Induction Type: IV induction Ventilation: Mask ventilation without difficulty Laryngoscope Size: Miller and 2 Grade View: Grade I Tube type: Oral Tube size: 7.5 mm Number of attempts: 1 Airway Equipment and Method: Stylet and Oral airway Placement Confirmation: ETT inserted through vocal cords under direct vision,  positive ETCO2 and breath sounds checked- equal and bilateral Tube secured with: Tape Dental Injury: Teeth and Oropharynx as per pre-operative assessment

## 2020-01-18 NOTE — Op Note (Signed)
[address]       [place] [address]             [phone]                                         01/18/2020    Patient:  Victoria Otto Pre-Op Dx: 3V CAD.   NSTEMI   HTN   Avery   Post-op Dx:  same Procedure: Off pump CABG X 3.  LIMA LAD, RSVG D1, PDA   Endoscopic greater saphenous vein harvest on the right Intra-operative Transesophageal Echocardiogram  Surgeon and Role:      * Victoria Avery, Victoria Crater, MD - Primary    * Dr. Servando Snare, MD - assisting  Assistant: Evonnie Pat, PA-C  Anesthesia  general EBL:  200 ml Blood Administration: none  Drains: 70 F blake drain:  R, L, mediastinal  Wires: none Counts: correct   Indications: 54 yo female with severe 3V CAD, now presents with an NSTEMI.  Hx of tobacco use.  She has been relatively asymptomatic until the week prior to her admission.  On exam, she appears well.  She is in sinus rhythm, and has clear lung sounds.  There is no significant lower extremity swelling.  Her echo shows an EF of 50% without significant valvular disease.  Her LHC shows an occluded RCA that fills via L>R collaterals.  Her LAD, and PDA are small, but potentially graftable.  Her circumflex system is too small for grafting.  Risks and benefits for a CABG 3 have been discussed in detail, and she agreeable to proceed with surgery.  Findings: Small LIMA.  Good target on LAD, heavily calcified proximally.  Good D1 target.  Calcified PDA target.    Operative Technique: After the risks, benefits and alternatives were thoroughly discussed, the patient was brought to the operative theatre.  All invasive lines were placed in pre-op holding.  Anesthesia was induced, and the patient was prepped and draped in normal sterile fashion.  An appropriate surgical pause was performed, and pre-operative antibiotics were dosed accordingly.  We began with simultaneous incisions were made along the right leg for harvesting of the greater saphenous vein  and the chest for the sternotomy.  In regards to the sternotomy, this was carried down with bovie cautery, and the sternum was divided with a reciprocating saw.  Meticulous hemostasis was obtained.  The left internal thoracic artery was exposed and harvested in in pedicled fashion.  The patient was systemically heparinized, and the artery was divided distally, and placed in a papaverine sponge.    The sternal elevator was removed, and a retractor was placed.  The pericardium was divided in the midline and fashioned into a cradle with pericardial stitches.   We exposed the anterior wall of the heart and identified a good target on the first diagonal branch.  The heart was stabilized, and an arteriotomy was created.  The vein was anastomosed using 7'0 proline in an end to side fashion.  Next, we exposed a good target on the  LAD, and fashioned an end to side anastomosis between it and the LITA.  We next exposed the posterior wall and identified a good target on PDA.  An end to side anastomosis between it and reverse saphenous vein.  Meticulous hemostasis was obtained.    A partial occludding clamp was then placed  on the ascending aorta, and we created an end to side anastomosis between it and the proximal vein grafts.  The proximal sites were marked with rings.  Hemostasis was obtained, and the heparin was reversed with protamine.  Chest tubes and wires were placed, and the sternum was re-approximated with with sternal wires.  The soft tissue and skin were re-approximated wth absorbable suture.    The patient tolerated the procedure without any immediate complications, and was transferred to the ICU in guarded condition.  Cristen Murcia Keane Scrape

## 2020-01-18 NOTE — Progress Notes (Signed)
Patient extubated @ 1500   Results for SMT, LOKEY (MRN [id number]) as of 01/18/2020    Pre-extubation ABG @ 1456 Post-extubation on 4 L [place]/Post dangle/Post pulmonary toileting ABG @ 1611   Ref. Range 01/18/2020 14:56 01/18/2020 16:11  Sample type Unknown ARTERIAL ARTERIAL  pH, Arterial Latest Ref Range: 7.350 - 7.450  7.338 (L) 7.357  pCO2 arterial Latest Ref Range: 32.0 - 48.0 mmHg 35.0 44.3  pO2, Arterial Latest Ref Range: 83.0 - 108.0 mmHg 124.0 (H) 110.0 (H)  TCO2 Latest Ref Range: 22 - 32 mmol/L 20 (L) 26  Acid-base deficit Latest Ref Range: 0.0 - 2.0 mmol/L 6.0 (H) 1.0  Bicarbonate Latest Ref Range: 20.0 - 28.0 mmol/L 18.7 (L) 24.6  O2 Saturation Latest Units: % 99.0 98.0  Patient temperature Unknown 37.5 C 37.8 C  Sodium Latest Ref Range: 135 - 145 mmol/L 143 138  Potassium Latest Ref Range: 3.5 - 5.1 mmol/L 3.6 5.3 (H)  Calcium Ionized Latest Ref Range: 1.15 - 1.40 mmol/L 1.07 (L) 1.20  Hemoglobin Latest Ref Range: 12.0 - 15.0 g/dL 8.5 (L) 80.6 (L)  HCT Latest Ref Range: 36.0 - 46.0 % 25.0 (L) 31.0 (L)    VSS stable, RN will continue to monitor patient closely.

## 2020-01-18 NOTE — Progress Notes (Addendum)
Late Entry for 02.18.21 @ 2110. Patient seen and assessed. Patient found sitting in bed. No acute distress noted. Physical assessment completed via computerized charting per [facility] policy. Reminded patient of upcoming open heart surgery slated for AM 01/18/20. Reminded patient of NPO status that will begin at midnight Verbalized understanding. Explained that patient will have two skin preps this evening prior to surgery.  Nursing Rounds for 02.19.21 @ 0515. Patient spouse at bedside. Explained to patient about Peridex PO will be swished and spat out similar to mouthwash. Explained that Peridex PO is infection control precaution. Verbalized understanding. Explained that transport will be available to get patient around 0650 this AM.   Patient transport at bedside. Patient removes glasses and headband. Disposable mask placed on patient per [facility] policy. Patient ambulates to stretcher. Heparin drip placed on hold. Patient transported via stretcher to OR holding. Face to face report given to [facility], CRNA.

## 2020-01-19 ENCOUNTER — Inpatient Hospital Stay (HOSPITAL_COMMUNITY): Payer: BC Managed Care – PPO

## 2020-01-19 DIAGNOSIS — Z951 Presence of aortocoronary bypass graft: Secondary | ICD-10-CM | POA: Diagnosis not present

## 2020-01-19 DIAGNOSIS — Z20822 Contact with and (suspected) exposure to covid-19: Secondary | ICD-10-CM | POA: Diagnosis not present

## 2020-01-19 DIAGNOSIS — I5022 Chronic systolic (congestive) heart failure: Secondary | ICD-10-CM | POA: Diagnosis not present

## 2020-01-19 DIAGNOSIS — J9811 Atelectasis: Secondary | ICD-10-CM | POA: Diagnosis not present

## 2020-01-19 DIAGNOSIS — I429 Cardiomyopathy, unspecified: Secondary | ICD-10-CM | POA: Diagnosis not present

## 2020-01-19 DIAGNOSIS — I214 Non-ST elevation (NSTEMI) myocardial infarction: Secondary | ICD-10-CM | POA: Diagnosis not present

## 2020-01-19 LAB — MAGNESIUM
Magnesium: 1.9 mg/dL (ref 1.7–2.4)
Magnesium: 1.6 mg/dL — ABNORMAL LOW (ref 1.7–2.4)

## 2020-01-19 LAB — GLUCOSE, CAPILLARY
Glucose-Capillary: 112 mg/dL — ABNORMAL HIGH (ref 70–99)
Glucose-Capillary: 114 mg/dL — ABNORMAL HIGH (ref 70–99)
Glucose-Capillary: 119 mg/dL — ABNORMAL HIGH (ref 70–99)
Glucose-Capillary: 120 mg/dL — ABNORMAL HIGH (ref 70–99)
Glucose-Capillary: 120 mg/dL — ABNORMAL HIGH (ref 70–99)
Glucose-Capillary: 126 mg/dL — ABNORMAL HIGH (ref 70–99)
Glucose-Capillary: 127 mg/dL — ABNORMAL HIGH (ref 70–99)
Glucose-Capillary: 128 mg/dL — ABNORMAL HIGH (ref 70–99)
Glucose-Capillary: 149 mg/dL — ABNORMAL HIGH (ref 70–99)
Glucose-Capillary: 150 mg/dL — ABNORMAL HIGH (ref 70–99)

## 2020-01-19 LAB — BASIC METABOLIC PANEL
Anion gap: 8 (ref 5–15)
Anion gap: 9 (ref 5–15)
BUN: 9 mg/dL (ref 6–20)
BUN: 6 mg/dL (ref 6–20)
CO2: 25 mmol/L (ref 22–32)
CO2: 24 mmol/L (ref 22–32)
Calcium: 8.6 mg/dL — ABNORMAL LOW (ref 8.9–10.3)
Calcium: 8.8 mg/dL — ABNORMAL LOW (ref 8.9–10.3)
Chloride: 103 mmol/L (ref 98–111)
Chloride: 102 mmol/L (ref 98–111)
Creatinine, Ser: 0.98 mg/dL (ref 0.44–1.00)
Creatinine, Ser: 0.91 mg/dL (ref 0.44–1.00)
GFR calc Af Amer: >60 mL/min (ref >60)
GFR calc Af Amer: >60 mL/min (ref >60)
GFR calc non Af Amer: >60 mL/min (ref >60)
GFR calc non Af Amer: >60 mL/min (ref >60)
Glucose, Bld: 116 mg/dL — ABNORMAL HIGH (ref 70–99)
Glucose, Bld: 140 mg/dL — ABNORMAL HIGH (ref 70–99)
Potassium: 4.5 mmol/L (ref 3.5–5.1)
Potassium: 4.2 mmol/L (ref 3.5–5.1)
Sodium: 136 mmol/L (ref 135–145)
Sodium: 135 mmol/L (ref 135–145)

## 2020-01-19 LAB — CBC
HCT: 31.1 % — ABNORMAL LOW (ref 36.0–46.0)
HCT: 32.1 % — ABNORMAL LOW (ref 36.0–46.0)
HCT: 30.7 % — ABNORMAL LOW (ref 36.0–46.0)
Hemoglobin: 10.2 g/dL — ABNORMAL LOW (ref 12.0–15.0)
Hemoglobin: 10.9 g/dL — ABNORMAL LOW (ref 12.0–15.0)
Hemoglobin: 10.2 g/dL — ABNORMAL LOW (ref 12.0–15.0)
MCH: 32.6 pg (ref 26.0–34.0)
MCH: 33.7 pg (ref 26.0–34.0)
MCH: 33 pg (ref 26.0–34.0)
MCHC: 32.8 g/dL (ref 30.0–36.0)
MCHC: 34 g/dL (ref 30.0–36.0)
MCHC: 33.2 g/dL (ref 30.0–36.0)
MCV: 99.4 fL (ref 80.0–100.0)
MCV: 99.4 fL (ref 80.0–100.0)
MCV: 99.4 fL (ref 80.0–100.0)
Platelets: 164 10*3/uL (ref 150–400)
Platelets: 179 10*3/uL (ref 150–400)
Platelets: 159 10*3/uL (ref 150–400)
RBC: 3.13 MIL/uL — ABNORMAL LOW (ref 3.87–5.11)
RBC: 3.23 MIL/uL — ABNORMAL LOW (ref 3.87–5.11)
RBC: 3.09 MIL/uL — ABNORMAL LOW (ref 3.87–5.11)
RDW: 12.4 % (ref 11.5–15.5)
RDW: 12.4 % (ref 11.5–15.5)
RDW: 12.6 % (ref 11.5–15.5)
WBC: 6.9 10*3/uL (ref 4.0–10.5)
WBC: 8.8 10*3/uL (ref 4.0–10.5)
WBC: 8 10*3/uL (ref 4.0–10.5)
nRBC: 0 % (ref 0.0–0.2)
nRBC: 0 % (ref 0.0–0.2)
nRBC: 0 % (ref 0.0–0.2)

## 2020-01-19 LAB — CREATININE, SERUM
Creatinine, Ser: 1.02 mg/dL — ABNORMAL HIGH (ref 0.44–1.00)
GFR calc Af Amer: >60 mL/min (ref >60)
GFR calc non Af Amer: >60 mL/min (ref >60)

## 2020-01-19 MED ORDER — ENOXAPARIN SODIUM 40 MG/0.4ML ~~LOC~~ SOLN
40.0000 mg | Freq: Every day | SUBCUTANEOUS | Status: DC
  Administered 2020-01-19 – 2020-01-20 (×2): 40 mg via SUBCUTANEOUS
  Filled 2020-01-19 (×2): qty 0.4

## 2020-01-19 MED ORDER — INSULIN ASPART 100 UNIT/ML ~~LOC~~ SOLN
0.0000 [IU] | SUBCUTANEOUS | Status: DC
  Administered 2020-01-19 (×3): 2 [IU] via SUBCUTANEOUS

## 2020-01-19 NOTE — Anesthesia Postprocedure Evaluation (Signed)
Anesthesia Post Note  Patient: Victoria Avery  Procedure(s) Performed: OFF PUMP CORONARY ARTERY BYPASS GRAFTING (CABG) times three using left internal mammary artery and right greater saphenous vein harvested endoscopically. (N/A Chest) TRANSESOPHAGEAL ECHOCARDIOGRAM (TEE) (N/A )     Patient location during evaluation: SICU Anesthesia Type: General Level of consciousness: sedated Pain management: pain level controlled Vital Signs Assessment: post-procedure vital signs reviewed and stable Respiratory status: patient remains intubated per anesthesia plan Cardiovascular status: stable Postop Assessment: no apparent nausea or vomiting Anesthetic complications: no               Zhaire Locker DANIEL

## 2020-01-19 NOTE — Progress Notes (Signed)
      [address]       [place] [address]             [phone]      POD # 1  Up in chair  BP 121/72 (BP Location: Right Arm)   Pulse (!) 101   Temp 99.1 F (37.3 C) (Oral)   Resp (!) 22   Ht 5\' 2"  (1.575 m)   Wt (!) 185 kg   LMP 11/25/2017   SpO2 100%   BMI 74.60 kg/m   Intake/Output Summary (Last 24 hours) at 01/19/2020 1640 Last data filed at 01/19/2020 1600 Gross per 24 hour  Intake 2219.74 ml  Output 1740 ml  Net 479.74 ml   Hct= 32 CBG well controlled Creatinine 1.02 Doing well POD #1  01/21/2020 C. Viviann Spare, MD [facility] [phone]

## 2020-01-19 NOTE — Progress Notes (Signed)
1 Day Post-Op Procedure(s) (LRB): OFF PUMP CORONARY ARTERY BYPASS GRAFTING (CABG) times three using left internal mammary artery and right greater saphenous vein harvested endoscopically. (N/A) TRANSESOPHAGEAL ECHOCARDIOGRAM (TEE) (N/A) Subjective: Some nausea and incisional pain  Objective: Vital signs in last 24 hours: Temp:  [96.3 F (35.7 C)-101.8 F (38.8 C)] 100.6 F (38.1 C) (02/20 0700) Pulse Rate:  [42-110] 101 (02/20 0700) Cardiac Rhythm: Sinus tachycardia (02/20 0400) Resp:  [10-42] 20 (02/20 0700) BP: (79-116)/(60-81) 103/70 (02/20 0700) SpO2:  [96 %-100 %] 97 % (02/20 0700) Arterial Line BP: (77-120)/(52-82) 104/64 (02/20 0700) FiO2 (%):  [40 %-50 %] 40 % (02/19 1403) Weight:  [185 kg] 185 kg (02/20 0500)  Hemodynamic parameters for last 24 hours: CVP:  [3 mmHg-31 mmHg] 4 mmHg CO:  [4.4 L/min] 4.4 L/min  Intake/Output from previous day: 02/19 0701 - 02/20 0700 In: 5542 [I.V.:3572; Blood:125; IV Piggyback:1845] Out: 2505 [Urine:1975; Blood:250; Chest Tube:280] Intake/Output this shift: Total I/O In: -  Out: 170 [Urine:110; Chest Tube:60]  General appearance: alert, cooperative and no distress Neurologic: intact Heart: regular rate and rhythm Lungs: diminished breath sounds bibasilar Abdomen: normal findings: soft, non-tender  Lab Results: Recent Labs    01/18/20 1816 01/19/20 0429  WBC 7.4 6.9  HGB 11.7* 10.2*  HCT 35.0* 31.1*  PLT 188 164   BMET:  Recent Labs    01/18/20 1816 01/19/20 0429  NA 134* 136  K 4.7 4.5  CL 102 103  CO2 22 25  GLUCOSE 145* 116*  BUN 11 9  CREATININE 0.97 0.98  CALCIUM 8.7* 8.6*    PT/INR:  Recent Labs    01/18/20 1237  LABPROT 14.9  INR 1.2   ABG    Component Value Date/Time   PHART 7.357 01/18/2020 1611   HCO3 24.6 01/18/2020 1611   TCO2 26 01/18/2020 1611   ACIDBASEDEF 1.0 01/18/2020 1611   O2SAT 98.0 01/18/2020 1611   CBG (last 3)  Recent Labs    01/19/20 0231 01/19/20 0415 01/19/20 0724   GLUCAP 114* 120* 126*    Assessment/Plan: S/P Procedure(s) (LRB): OFF PUMP CORONARY ARTERY BYPASS GRAFTING (CABG) times three using left internal mammary artery and right greater saphenous vein harvested endoscopically. (N/A) TRANSESOPHAGEAL ECHOCARDIOGRAM (TEE) (N/A) -  CV- stable hemodymamics- dc A line RESP- IS for basilar atelectasis RENAL- creatinine and lytes OK  Weight obviously erroneous- should be 185 Lbs not Kg ENDO- CBG well controlled- transition to q4 Anemia secondary to ABL- mild, follow SCD + enoxaparin Ambulate DC chest tubes    LOS: 3 days    Victoria Avery 01/19/2020

## 2020-01-20 ENCOUNTER — Encounter (HOSPITAL_COMMUNITY): Payer: Self-pay | Admitting: Thoracic Surgery (Cardiothoracic Vascular Surgery)

## 2020-01-20 ENCOUNTER — Inpatient Hospital Stay (HOSPITAL_COMMUNITY): Payer: BC Managed Care – PPO

## 2020-01-20 ENCOUNTER — Other Ambulatory Visit: Payer: Self-pay

## 2020-01-20 DIAGNOSIS — I517 Cardiomegaly: Secondary | ICD-10-CM | POA: Diagnosis not present

## 2020-01-20 DIAGNOSIS — J9811 Atelectasis: Secondary | ICD-10-CM | POA: Diagnosis not present

## 2020-01-20 DIAGNOSIS — J90 Pleural effusion, not elsewhere classified: Secondary | ICD-10-CM | POA: Diagnosis not present

## 2020-01-20 DIAGNOSIS — Z95818 Presence of other cardiac implants and grafts: Secondary | ICD-10-CM | POA: Diagnosis not present

## 2020-01-20 LAB — BASIC METABOLIC PANEL
Anion gap: 6 (ref 5–15)
BUN: 7 mg/dL (ref 6–20)
CO2: 26 mmol/L (ref 22–32)
Calcium: 8.9 mg/dL (ref 8.9–10.3)
Chloride: 101 mmol/L (ref 98–111)
Creatinine, Ser: 1.02 mg/dL — ABNORMAL HIGH (ref 0.44–1.00)
GFR calc Af Amer: >60 mL/min (ref >60)
GFR calc non Af Amer: >60 mL/min (ref >60)
Glucose, Bld: 115 mg/dL — ABNORMAL HIGH (ref 70–99)
Potassium: 4.2 mmol/L (ref 3.5–5.1)
Sodium: 133 mmol/L — ABNORMAL LOW (ref 135–145)

## 2020-01-20 LAB — CBC
HCT: 28.1 % — ABNORMAL LOW (ref 36.0–46.0)
Hemoglobin: 9.3 g/dL — ABNORMAL LOW (ref 12.0–15.0)
MCH: 33.7 pg (ref 26.0–34.0)
MCHC: 33.1 g/dL (ref 30.0–36.0)
MCV: 101.8 fL — ABNORMAL HIGH (ref 80.0–100.0)
Platelets: 162 10*3/uL (ref 150–400)
RBC: 2.76 MIL/uL — ABNORMAL LOW (ref 3.87–5.11)
RDW: 12.7 % (ref 11.5–15.5)
WBC: 8.1 10*3/uL (ref 4.0–10.5)
nRBC: 0 % (ref 0.0–0.2)

## 2020-01-20 LAB — GLUCOSE, CAPILLARY
Glucose-Capillary: 113 mg/dL — ABNORMAL HIGH (ref 70–99)
Glucose-Capillary: 126 mg/dL — ABNORMAL HIGH (ref 70–99)
Glucose-Capillary: 99 mg/dL (ref 70–99)
Glucose-Capillary: 104 mg/dL — ABNORMAL HIGH (ref 70–99)
Glucose-Capillary: 125 mg/dL — ABNORMAL HIGH (ref 70–99)

## 2020-01-20 MED ORDER — SODIUM CHLORIDE 0.9% FLUSH
3.0000 mL | Freq: Two times a day (BID) | INTRAVENOUS | Status: DC
  Administered 2020-01-20 (×2): 3 mL via INTRAVENOUS

## 2020-01-20 MED ORDER — ZOLPIDEM TARTRATE 5 MG PO TABS: 5.0000 mg | ORAL_TABLET | Freq: Every evening | ORAL | Status: DC | PRN

## 2020-01-20 MED ORDER — MAGNESIUM HYDROXIDE 400 MG/5ML PO SUSP: 30.0000 mL | Freq: Every day | ORAL | Status: DC | PRN

## 2020-01-20 MED ORDER — SODIUM CHLORIDE 0.9 % IV SOLN: 250.0000 mL | INTRAVENOUS | Status: DC | PRN

## 2020-01-20 MED ORDER — INSULIN ASPART 100 UNIT/ML ~~LOC~~ SOLN
0.0000 [IU] | Freq: Three times a day (TID) | SUBCUTANEOUS | Status: DC
  Administered 2020-01-20: 1 [IU] via SUBCUTANEOUS

## 2020-01-20 MED ORDER — ALUM & MAG HYDROXIDE-SIMETH 200-200-20 MG/5ML PO SUSP: 15.0000 mL | Freq: Four times a day (QID) | ORAL | Status: DC | PRN

## 2020-01-20 MED ORDER — SODIUM CHLORIDE 0.9% FLUSH: 3.0000 mL | INTRAVENOUS | Status: DC | PRN

## 2020-01-20 MED ORDER — ~~LOC~~ CARDIAC SURGERY, PATIENT & FAMILY EDUCATION: Freq: Once | Status: AC

## 2020-01-20 NOTE — Progress Notes (Signed)
Pt transferred to 4E-08 via wheelchair from [facility]. Pt walked to bed. Tele applied, CCMD notified.Pt oriented to call bell, room and bed. Call bell within reach. VSS. Will continue to monitor. Theophilus Kinds, RN

## 2020-01-20 NOTE — Plan of Care (Signed)
  Problem: Education: Goal: Knowledge of General Education information will improve Description: Including pain rating scale, medication(s)/side effects and non-pharmacologic comfort measures Outcome: Adequate for Discharge   Problem: Health Behavior/Discharge Planning: Goal: Ability to manage health-related needs will improve Outcome: Progressing   Problem: Clinical Measurements: Goal: Ability to maintain clinical measurements within normal limits will improve Outcome: Progressing Goal: Will remain free from infection Outcome: Progressing Goal: Respiratory complications will improve Outcome: Progressing Goal: Cardiovascular complication will be avoided Outcome: Progressing   Problem: Activity: Goal: Risk for activity intolerance will decrease Outcome: Progressing   Problem: Nutrition: Goal: Adequate nutrition will be maintained Outcome: Progressing   Problem: Elimination: Goal: Will not experience complications related to bowel motility Outcome: Progressing Goal: Will not experience complications related to urinary retention Outcome: Progressing   Problem: Pain Managment: Goal: General experience of comfort will improve Outcome: Progressing   Problem: Safety: Goal: Ability to remain free from injury will improve Outcome: Progressing   Problem: Skin Integrity: Goal: Risk for impaired skin integrity will decrease Outcome: Progressing   Problem: Education: Goal: Will demonstrate proper wound care and an understanding of methods to prevent future damage Outcome: Progressing Goal: Knowledge of disease or condition will improve Outcome: Progressing Goal: Knowledge of the prescribed therapeutic regimen will improve Outcome: Progressing   Problem: Activity: Goal: Risk for activity intolerance will decrease Outcome: Progressing   Problem: Cardiac: Goal: Will achieve and/or maintain hemodynamic stability Outcome: Progressing   Problem: Clinical Measurements: Goal:  Postoperative complications will be avoided or minimized Outcome: Progressing   Problem: Respiratory: Goal: Respiratory status will improve Outcome: Progressing   Problem: Skin Integrity: Goal: Wound healing without signs and symptoms of infection Outcome: Progressing Goal: Risk for impaired skin integrity will decrease Outcome: Progressing   Problem: Urinary Elimination: Goal: Ability to achieve and maintain adequate renal perfusion and functioning will improve Outcome: Progressing

## 2020-01-20 NOTE — Progress Notes (Signed)
2 Days Post-Op Procedure(s) (LRB): OFF PUMP CORONARY ARTERY BYPASS GRAFTING (CABG) times three using left internal mammary artery and right greater saphenous vein harvested endoscopically. (N/A) TRANSESOPHAGEAL ECHOCARDIOGRAM (TEE) (N/A) Subjective: No complaints this morning  Objective: Vital signs in last 24 hours: Temp:  [98.2 F (36.8 C)-102 F (38.9 C)] 98.2 F (36.8 C) (02/21 0300) Pulse Rate:  [83-107] 100 (02/21 0700) Cardiac Rhythm: Normal sinus rhythm (02/21 0400) Resp:  [15-26] 17 (02/21 0700) BP: (85-123)/(54-78) 113/77 (02/21 0700) SpO2:  [90 %-100 %] 95 % (02/21 0700) Weight:  [84.6 kg] 84.6 kg (02/21 0500)  Hemodynamic parameters for last 24 hours:    Intake/Output from previous day: 02/20 0701 - 02/21 0700 In: 1028.4 [P.O.:360; I.V.:467.4; IV Piggyback:201] Out: 1910 [Urine:1800; Chest Tube:110] Intake/Output this shift: No intake/output data recorded.  General appearance: alert, cooperative and no distress Neurologic: intact Heart: regular rate and rhythm Lungs: diminished breath sounds bibasilar  Lab Results: Recent Labs    01/19/20 1600 01/20/20 0316  WBC 8.0 8.1  HGB 10.2* 9.3*  HCT 30.7* 28.1*  PLT 159 162   BMET:  Recent Labs    01/19/20 1600 01/20/20 0316  NA 135 133*  K 4.2 4.2  CL 102 101  CO2 24 26  GLUCOSE 140* 115*  BUN 6 7  CREATININE 0.91 1.02*  CALCIUM 8.8* 8.9    PT/INR:  Recent Labs    01/18/20 1237  LABPROT 14.9  INR 1.2   ABG    Component Value Date/Time   PHART 7.357 01/18/2020 1611   HCO3 24.6 01/18/2020 1611   TCO2 26 01/18/2020 1611   ACIDBASEDEF 1.0 01/18/2020 1611   O2SAT 98.0 01/18/2020 1611   CBG (last 3)  Recent Labs    01/20/20 0313 01/20/20 0315 01/20/20 0737  GLUCAP 44* 113* 126*    Assessment/Plan: S/P Procedure(s) (LRB): OFF PUMP CORONARY ARTERY BYPASS GRAFTING (CABG) times three using left internal mammary artery and right greater saphenous vein harvested endoscopically.  (N/A) TRANSESOPHAGEAL ECHOCARDIOGRAM (TEE) (N/A) Plan for transfer to step-down: see transfer orders  CV- stable in SR, BP a little low last night but in normal range this AM RESP- continue IS for basilar atelectasis RENAL- diuresing on her own, lytes and creatinine OK ENDO- CBG mildly elevated but 1 episode of hypoglycemia overnight- change SSI to [facility] SCD + enoxaparin for DVT prophylaxis Continue cardiac rehab DC central line and Foley   LOS: 4 days    [name] 01/20/2020

## 2020-01-21 ENCOUNTER — Encounter: Payer: Self-pay | Admitting: *Deleted

## 2020-01-21 ENCOUNTER — Inpatient Hospital Stay (HOSPITAL_COMMUNITY): Payer: BC Managed Care – PPO

## 2020-01-21 DIAGNOSIS — J90 Pleural effusion, not elsewhere classified: Secondary | ICD-10-CM | POA: Diagnosis not present

## 2020-01-21 LAB — CBC
HCT: 30.2 % — ABNORMAL LOW (ref 36.0–46.0)
Hemoglobin: 9.9 g/dL — ABNORMAL LOW (ref 12.0–15.0)
MCH: 33 pg (ref 26.0–34.0)
MCHC: 32.8 g/dL (ref 30.0–36.0)
MCV: 100.7 fL — ABNORMAL HIGH (ref 80.0–100.0)
Platelets: 201 10*3/uL (ref 150–400)
RBC: 3 MIL/uL — ABNORMAL LOW (ref 3.87–5.11)
RDW: 12.4 % (ref 11.5–15.5)
WBC: 7.7 10*3/uL (ref 4.0–10.5)
nRBC: 0 % (ref 0.0–0.2)

## 2020-01-21 LAB — BASIC METABOLIC PANEL
Anion gap: 9 (ref 5–15)
BUN: 7 mg/dL (ref 6–20)
CO2: 27 mmol/L (ref 22–32)
Calcium: 9.2 mg/dL (ref 8.9–10.3)
Chloride: 100 mmol/L (ref 98–111)
Creatinine, Ser: 0.92 mg/dL (ref 0.44–1.00)
GFR calc Af Amer: >60 mL/min (ref >60)
GFR calc non Af Amer: >60 mL/min (ref >60)
Glucose, Bld: 113 mg/dL — ABNORMAL HIGH (ref 70–99)
Potassium: 4.5 mmol/L (ref 3.5–5.1)
Sodium: 136 mmol/L (ref 135–145)

## 2020-01-21 LAB — GLUCOSE, CAPILLARY
Glucose-Capillary: 115 mg/dL — ABNORMAL HIGH (ref 70–99)
Glucose-Capillary: 119 mg/dL — ABNORMAL HIGH (ref 70–99)

## 2020-01-21 MED ORDER — METOPROLOL TARTRATE 25 MG/10 ML ORAL SUSPENSION
25.0000 mg | Freq: Two times a day (BID) | ORAL | Status: DC
  Filled 2020-01-21 (×2): qty 10

## 2020-01-21 MED ORDER — FUROSEMIDE 40 MG PO TABS: 40.0000 mg | ORAL_TABLET | Freq: Every day | ORAL | 0 refills | Status: DC

## 2020-01-21 MED ORDER — POTASSIUM CHLORIDE ER 20 MEQ PO TBCR: 10.0000 meq | EXTENDED_RELEASE_TABLET | Freq: Every day | ORAL | 0 refills | Status: DC

## 2020-01-21 MED ORDER — METOPROLOL TARTRATE 25 MG PO TABS
25.0000 mg | ORAL_TABLET | Freq: Two times a day (BID) | ORAL | Status: DC
  Administered 2020-01-21: 25 mg via ORAL
  Filled 2020-01-21: qty 1

## 2020-01-21 MED ORDER — ATORVASTATIN CALCIUM 80 MG PO TABS: 80.0000 mg | ORAL_TABLET | Freq: Every day | ORAL | 0 refills | Status: DC

## 2020-01-21 MED ORDER — ACETAMINOPHEN 500 MG PO TABS: 1000.0000 mg | ORAL_TABLET | Freq: Four times a day (QID) | ORAL | 0 refills | Status: AC | PRN

## 2020-01-21 MED ORDER — OXYCODONE HCL 5 MG PO TABS: 5.0000 mg | ORAL_TABLET | ORAL | 0 refills | Status: DC | PRN

## 2020-01-21 MED ORDER — LACTULOSE 10 GM/15ML PO SOLN: 20.0000 g | Freq: Every day | ORAL | Status: DC | PRN

## 2020-01-21 MED ORDER — ASPIRIN 325 MG PO TBEC: 325.0000 mg | DELAYED_RELEASE_TABLET | Freq: Every day | ORAL | 0 refills | Status: DC

## 2020-01-21 MED FILL — Lidocaine HCl Local Preservative Free (PF) Inj 2%: INTRAMUSCULAR | Qty: 15 | Status: AC

## 2020-01-21 MED FILL — Heparin Sodium (Porcine) Inj 1000 Unit/ML: INTRAMUSCULAR | Qty: 30 | Status: AC

## 2020-01-21 MED FILL — Sodium Chloride IV Soln 0.9%: INTRAVENOUS | Qty: 3000 | Status: AC

## 2020-01-21 MED FILL — Potassium Chloride Inj 2 mEq/ML: INTRAVENOUS | Qty: 40 | Status: AC

## 2020-01-21 NOTE — Progress Notes (Signed)
CARDIAC REHAB PHASE I   D/c education completed with pt and husband. Pt educated on importance of site care and monitoring incision daily. Encouraged continued IS use, walks, and sternal precautions. Pt given in-th-tube sheet along with a heart healthy diet. Reviewed restrictions and exercise guidelines. Will refer to CRP II [place]. Pt is interested in participating in Virtual Cardiac and Pulmonary Rehab. Pt advised that Virtual Cardiac and Pulmonary Rehab is provided at no cost to the patient.  Checklist:  1. Pt has smart device  ie smartphone and/or ipad for downloading an app  Yes 2. Reliable internet/wifi service    Yes 3. Understands how to use their smartphone and navigate within an app.  Yes  Pt verbalized understanding and is in agreement.   [id number] Reynold Bowen, RN BSN 01/21/2020 2:07 PM

## 2020-01-21 NOTE — Progress Notes (Signed)
Pt provided discharge instructions and education. Pt IV removed and intact. Telebox removed and intact. Pt denies any complaints. Vitals stable. Pt has all belongings. Pt tx via wheelchair to meet ride at [employer].  Lacy Duverney, RN

## 2020-01-21 NOTE — Discharge Instructions (Signed)

## 2020-01-21 NOTE — Discharge Summary (Signed)
Physician Discharge Summary  Patient ID: Victoria Avery MRN: [id number] DOB/AGE: [birth year] 52 y.o.  Admit date: 01/16/2020 Discharge date: 01/21/2020  Admission Diagnoses:  Patient Active Problem List   Diagnosis Date Noted  . Coronary artery disease involving native coronary artery of native heart without angina pectoris 01/15/2020  . Systolic dysfunction 56/38/7564  . Hyperglycemia 01/15/2020  . NSTEMI (non-ST elevated myocardial infarction) ([place]) 01/13/2020  . Hypertensive urgency 12/10/2017  . Smoker 12/10/2017  . Elevated troponin 12/10/2017  . Hyperlipidemia LDL goal <70 12/10/2017  . Sinusitis 12/10/2017  . HTN (hypertension) 11/25/2017   Discharge Diagnoses:   Patient Active Problem List   Diagnosis Date Noted  . S/P CABG x 3 01/18/2020  . Coronary artery disease involving native coronary artery of native heart without angina pectoris 01/15/2020  . Systolic dysfunction 33/29/5188  . Hyperglycemia 01/15/2020  . NSTEMI (non-ST elevated myocardial infarction) ([place]) 01/13/2020  . Hypertensive urgency 12/10/2017  . Smoker 12/10/2017  . Elevated troponin 12/10/2017  . Hyperlipidemia LDL goal <70 12/10/2017  . Sinusitis 12/10/2017  . HTN (hypertension) 11/25/2017   Discharged Condition: good  History of Present Illness:   Mrs. Victoria Avery is a 54 yo white female with history of HTN and nicotine abuse of 1 ppd.  She was admitted back in 2018 for a hypertensive urgency.  During that time Echocardiogram was obtained and showed an EF of 45-50% with grade 1 diastolic dysfunction, and mild mitral regurgitation.  She was last seen by Dr. Rockey Situ in March of 2020 at which time she was doing well.  The patient presented to [facility] on 01/13/2020.  The patient stated she had not been feeling well for several days.  On 01/13/2020 she awoke with chest pain.  Despite this the patient went to breakfast but when she came home she continued to not feel well.  She went to  lay down and the patient's chest pressure persisted.  She denied associated shortness of breath, palpitations, nausea, or vomiting.  Workup in the ED consisted of EKG which showed ST depression.  Her troponin levels were positive.  She was ruled in for NSTEMI.  She was given NTG which relieved her pain, started on Heparin and admitted for further care.    Hospital Course:   Cardiology consult was obtained.  They felt she should undergo cardiac catheterization.  This was performed 01/14/2020 and showed a mildly reduced EF and multivessel CAD.  It was felt coronary bypass grafting would be indicated.  She was transferred to  [facility] for surgical consultation.  She is currently chest pain free.  She states she isn't really sure of her family history as she doesn't know her dad that well.  However she believes her mother and father have both had coronary disease.  She was evaluated by Dr. Kipp Brood who was in agreement the patient would benefit from coronary bypass grafting procedure.  The risks and benefits of the procedure were explained to the patient and she was agreeable to proceed.  She was taken to the operating room and underwent CABG x 3 utilizing LIMA to LAD, RSVG to Diagonal 1, and RSVG to PDA.  She also underwent endoscopic harvest of greater saphenous vein from her right leg.  She tolerated the procedure without difficulty and was taken to the SICU in stable condition.  She was extubated the evening of surgery.  Her chest tubes and arterial lines were removed without difficulty.  She was maintaining NSR and was transferred to the  floor on 01/20/2020.  She continues to make good progress.  She was mildly tachycardic and her Lopressor dose was increased.  She is mildly volume overloaded and has been started on lasix.  She is ambulating independently.  Her incisions are healing without evidence of infection.  She is medically stable for discharge home today.   Significant Diagnostic Studies:  angiography:    There is mild left ventricular systolic dysfunction.  LV end diastolic pressure is normal.  The left ventricular ejection fraction is 45-50% by visual estimate.  Prox RCA lesion is 90% stenosed.  Prox RCA to Mid RCA lesion is 100% stenosed.  Mid Cx to Dist Cx lesion is 99% stenosed.  Mid LAD-1 lesion is 90% stenosed.  Mid LAD-2 lesion is 60% stenosed.  1st Diag-1 lesion is 85% stenosed.  1st Diag-2 lesion is 70% stenosed.   1.  Severe three-vessel coronary artery disease with chronic occlusion of the RCA with left-to-right collaterals, subtotal occlusion of the distal left circumflex supplying small OM branches and significant mid LAD stenosis and significant disease in ostial large first diagonal.  OM branches are likely too small to graft. 2.  Mildly reduced LV systolic function with an EF of 45 to 50% with inferior wall hypokinesis. 3.  Normal left ventricular end-diastolic pressure.  Recommendations: I think the best option for complete revascularization is CABG.  The patient was not sure if she wanted to go through that but I decided to get her off the Cath Lab table to discuss this further.  PCI options including treating the LAD with PCI and leaving everything else to be treated medically.  However, that will not lead to complete revascularization.   Treatments: surgery:   Off pump CABG X 3.  LIMA LAD, RSVG D1, PDA  Endoscopic greater saphenous vein harvest on the right Intra-operative Transesophageal Echocardiogram  Discharge Exam: Blood pressure 139/83, pulse (!) 103, temperature 98.4 F (36.9 C), temperature source Oral, resp. rate 20, height 5\' 2"  (1.575 m), weight 77.7 kg, last menstrual period 11/25/2017, SpO2 94 %.  General appearance: alert, cooperative and no distress Heart: regular rate and rhythm Lungs: clear to auscultation bilaterally Abdomen: soft, non-tender; bowel sounds normal; no masses,  no organomegaly Extremities: edema  trace Wound: clean and dry    Discharge disposition: 01-Home or Self Care  Discharge Medications:  Allergies as of 01/21/2020   No Known Allergies     Medication List    TAKE these medications   acetaminophen 500 MG tablet Commonly known as: TYLENOL Take 2 tablets (1,000 mg total) by mouth every 6 (six) hours as needed.   aspirin 325 MG EC tablet Take 1 tablet (325 mg total) by mouth daily. Start taking on: January 22, 2020   atorvastatin 80 MG tablet Commonly known as: LIPITOR Take 1 tablet (80 mg total) by mouth daily at 6 PM.   furosemide 40 MG tablet Commonly known as: Lasix Take 1 tablet (40 mg total) by mouth daily.   lisinopril-hydrochlorothiazide 20-12.5 MG tablet Commonly known as: ZESTORETIC Take 1.5 tablets by mouth daily.   metoprolol succinate 50 MG 24 hr tablet Commonly known as: TOPROL-XL Take 50 mg by mouth daily. Take with or immediately following a meal.   oxyCODONE 5 MG immediate release tablet Commonly known as: Oxy IR/ROXICODONE Take 1-2 tablets (5-10 mg total) by mouth every 4 (four) hours as needed for severe pain.   Potassium Chloride ER 20 MEQ Tbcr Take 10 mEq by mouth daily.  Follow-up Information    Corliss Skains, MD Follow up on 01/25/2020.   Specialty: Cardiothoracic Surgery Why: Appointment is at 11:00 Contact information: [address] [place] [address] [phone]        Alver Sorrow, NP Follow up on 02/05/2020.   Specialty: Cardiology Why: Appointment is at 9:00 Contact information: [address] [place] [address] [phone]           Signed: Lowella Dandy, PA-C  01/21/2020, 1:37 PM

## 2020-01-21 NOTE — Progress Notes (Signed)
CARDIAC REHAB PHASE I   PRE:  Rate/Rhythm: 88 SR  BP:  Sitting: 115/81      SaO2: 96 RA  MODE:  Ambulation: 470 ft   POST:  Rate/Rhythm: 102 ST  BP:  Sitting: 139/83    SaO2: 95 RA   Pt ambulated 436ft in hallway independently with front wheel walker. Pt with some SOB upon return to room. Encouraged a third walk later today, and continued IS use. Pt educated on monitoring incision, some swelling and redness noted at top of incision. RN made aware. Will continue to follow.  [id number] Reynold Bowen, RN BSN 01/21/2020 11:03 AM

## 2020-01-21 NOTE — Progress Notes (Signed)
Pt ambulated x 470 feet with front wheel walker, pt tolerated well  

## 2020-01-21 NOTE — Progress Notes (Signed)
      [address]       [name] [address]             [phone]      3 Days Post-Op Procedure(s) (LRB): OFF PUMP CORONARY ARTERY BYPASS GRAFTING (CABG) times three using left internal mammary artery and right greater saphenous vein harvested endoscopically. (N/A) TRANSESOPHAGEAL ECHOCARDIOGRAM (TEE) (N/A)   Subjective:  Up in chair, no complaints.  Looks good.  Objective: Vital signs in last 24 hours: Temp:  [98 F (36.7 C)-98.9 F (37.2 C)] 98.9 F (37.2 C) (02/22 0447) Pulse Rate:  [45-104] 104 (02/22 0447) Cardiac Rhythm: Normal sinus rhythm;Sinus tachycardia (02/22 0447) Resp:  [17-24] 20 (02/22 0447) BP: (101-131)/(60-89) 118/87 (02/22 0447) SpO2:  [92 %-100 %] 92 % (02/22 0447) Weight:  [77.7 kg-83.4 kg] 77.7 kg (02/22 0447)  Intake/Output from previous day: 02/21 0701 - 02/22 0700 In: 900.2 [P.O.:620; I.V.:30.2] Out: -   General appearance: alert, cooperative and no distress Heart: regular rate and rhythm Lungs: clear to auscultation bilaterally Abdomen: soft, non-tender; bowel sounds normal; no masses,  no organomegaly Extremities: edema trace Wound: clean and dry  Lab Results: Recent Labs    01/20/20 0316 01/21/20 0055  WBC 8.1 7.7  HGB 9.3* 9.9*  HCT 28.1* 30.2*  PLT 162 201   BMET:  Recent Labs    01/20/20 0316 01/21/20 0055  NA 133* 136  K 4.2 4.5  CL 101 100  CO2 26 27  GLUCOSE 115* 113*  BUN 7 7  CREATININE 1.02* 0.92  CALCIUM 8.9 9.2    PT/INR:  Recent Labs    01/18/20 1237  LABPROT 14.9  INR 1.2   ABG    Component Value Date/Time   PHART 7.357 01/18/2020 1611   HCO3 24.6 01/18/2020 1611   TCO2 26 01/18/2020 1611   ACIDBASEDEF 1.0 01/18/2020 1611   O2SAT 98.0 01/18/2020 1611   CBG (last 3)  Recent Labs    01/20/20 1649 01/20/20 2133 01/21/20 0618  GLUCAP 104* 125* 115*    Assessment/Plan: S/P Procedure(s) (LRB): OFF PUMP CORONARY ARTERY BYPASS GRAFTING (CABG) times three using left internal  mammary artery and right greater saphenous vein harvested endoscopically. (N/A) TRANSESOPHAGEAL ECHOCARDIOGRAM (TEE) (N/A)  1. CV- Mild Tachycardia- will increase Lopressor 2. Pulm- no acute issues, continue IS 3. Renal- creatinine WNL, weight is trending down 4. GI- no BM yet, passing gas, will order Lactulose prn 5. CBGs controlled, patient isn't a diabetic will d/c SSIP 6. Dispo- patient stable, doing very well, increase BB for better HR control, possibly for d/c in AM   LOS: 5 days    Lowella Dandy, PA-C  01/21/2020

## 2020-01-22 LAB — GLUCOSE, CAPILLARY: Glucose-Capillary: 44 mg/dL — CL (ref 70–99)

## 2020-01-25 ENCOUNTER — Ambulatory Visit (INDEPENDENT_AMBULATORY_CARE_PROVIDER_SITE_OTHER): Payer: Self-pay | Admitting: Thoracic Surgery (Cardiothoracic Vascular Surgery)

## 2020-01-25 ENCOUNTER — Encounter: Payer: Self-pay | Admitting: Thoracic Surgery (Cardiothoracic Vascular Surgery)

## 2020-01-25 ENCOUNTER — Other Ambulatory Visit: Payer: Self-pay

## 2020-01-25 VITALS — BP 139/87 | HR 100 | Temp 97.7°F | Resp 20 | Ht 62.0 in | Wt 178.0 lb

## 2020-01-25 DIAGNOSIS — I2510 Atherosclerotic heart disease of native coronary artery without angina pectoris: Secondary | ICD-10-CM

## 2020-01-25 DIAGNOSIS — Z951 Presence of aortocoronary bypass graft: Secondary | ICD-10-CM

## 2020-01-25 NOTE — Progress Notes (Signed)
      [address]       [place] [address]             [phone]        Victoria Avery [facility] Medical Record #[id number] Date of Birth: 1966/04/28  Referring: Iran Ouch, MD Primary Care: Patient, No Pcp Per Primary Cardiologist:Timothy Mariah Milling, MD  Reason for visit:   follow-up  History of Present Illness:     Victoria Avery comes in for her first follow-up appointment.  She only complains of some constipation.  Otherwise she is doing well  Physical Exam: BP 139/87   Pulse 100   Temp 97.7 F (36.5 C) (Skin)   Resp 20   Ht 5\' 2"  (1.575 m)   Wt 178 lb (80.7 kg)   LMP 11/25/2017   SpO2 95%   BMI 32.56 kg/m   Alert NAD Incision clear.  Sternum stable regular Abdomen soft, ND trace peripheral edema   Diagnostic Studies & Laboratory data:      Assessment / Plan:   S/p CABG, doing well Stressed continued smoking cessation Will follow-up in 1 month with CXR   11/27/2017 01/25/2020 1:59 PM

## 2020-01-31 ENCOUNTER — Telehealth: Payer: Self-pay

## 2020-01-31 NOTE — Telephone Encounter (Signed)
Victoria Avery FMLA/State Leave forms faxed to [phone]. And copy mailed to patient

## 2020-02-04 NOTE — Progress Notes (Signed)
Office Visit    Patient Name: Victoria Avery Date of Encounter: 02/04/2020  Primary Care Provider:  Patient, No Pcp Per Primary Cardiologist:  Julien Nordmann, MD Electrophysiologist:  None   Chief Complaint    Victoria Avery is a 54 y.o. female with a hx of HTN, HLD, hyperglycemia, CAD s/p CABG, tobacco abuse, systolic dysfunction presents today for follow-up after CABG.  Past Medical History    Past Medical History:  Diagnosis Date  . Cardiomyopathy (HCC)    a. 10/2017 Echo: EF 45-50% Gr1 DD, mild MR. Nl LA size. Nl RV fxn and PASP.  Marland Kitchen Hypertension   . Tobacco abuse    Past Surgical History:  Procedure Laterality Date  . CESAREAN SECTION    . CORONARY ARTERY BYPASS GRAFT N/A 01/18/2020   Procedure: OFF PUMP CORONARY ARTERY BYPASS GRAFTING (CABG) times three using left internal mammary artery and right greater saphenous vein harvested endoscopically.;  Surgeon: Corliss Skains, MD;  Location: [facility] OR;  Service: Open Heart Surgery;  Laterality: N/A;  . LEFT HEART CATH AND CORONARY ANGIOGRAPHY N/A 01/14/2020   Procedure: LEFT HEART CATH AND CORONARY ANGIOGRAPHY;  Surgeon: Iran Ouch, MD;  Location: [facility] INVASIVE CV LAB;  Service: Cardiovascular;  Laterality: N/A;  . TEE WITHOUT CARDIOVERSION N/A 01/18/2020   Procedure: TRANSESOPHAGEAL ECHOCARDIOGRAM (TEE);  Surgeon: Corliss Skains, MD;  Location[facility] OR;  Service: Open Heart Surgery;  Laterality: N/A;   Allergies  No Known Allergies  History of Present Illness    Victoria Avery is a 54 y.o. female with a hx of HTN, systolic dysfunction, CAD s/p CABG, tobacco abuse last seen while hospitalized.  Previously admitted 10/2017 with hypertensive urgency.  Echo at that time with LVEF 45 to 50%, grade 1 diastolic dysfunction, mild AI, normal LA, normal RV systolic function with normal PASP-she declined ischemic evaluation.  Seen in the office 01/2019 doing well.  Presented to [facility] 01/13/2020 reporting chest  pain.  ED EKG showed ST dependent depression with positive troponin.  Ruled in for NSTEMI.  Cardiac cath 01/14/2020 with mildly reduced LVEF and multivessel CAD.  Transferred to [facility] for CABG.  Underwent CABG with LIMA to LAD, R SVG to diagonal 1, SVG to PDA.  She was discharged 01/21/2020.  Presents today with her husband.  Reports no chest pain, pressure, tightness since discharge.  Reports no SOB nor DOE.  Tells me she has been walking 3 times per day.  We reviewed her cardiac cath and subsequent surgical interventions.  All questions were answered.  We reviewed her medications in detail.  She has been checking blood pressure at home with readings routinely 120s over 80s.  We discussed cardiac rehab and she tells me she anticipates beginning after her next follow-up appointment Dr. Cliffton Asters.  She is very interested in participating.  EKGs/Labs/Other Studies Reviewed:   The following studies were reviewed today:  Cardiac cath 01/14/20  There is mild left ventricular systolic dysfunction.  LV end diastolic pressure is normal.  The left ventricular ejection fraction is 45-50% by visual estimate.  Prox RCA lesion is 90% stenosed.  Prox RCA to Mid RCA lesion is 100% stenosed.  Mid Cx to Dist Cx lesion is 99% stenosed.  Mid LAD-1 lesion is 90% stenosed.  Mid LAD-2 lesion is 60% stenosed.  1st Diag-1 lesion is 85% stenosed.  1st Diag-2 lesion is 70% stenosed.   1.  Severe three-vessel coronary artery disease with chronic occlusion of the RCA with left-to-right  collaterals, subtotal occlusion of the distal left circumflex supplying small OM branches and significant mid LAD stenosis and significant disease in ostial large first diagonal.  OM branches are likely too small to graft. 2.  Mildly reduced LV systolic function with an EF of 45 to 50% with inferior wall hypokinesis. 3.  Normal left ventricular end-diastolic pressure.   Recommendations: I think the best option for  complete revascularization is CABG.  The patient was not sure if she wanted to go through that but I decided to get her off the Cath Lab table to discuss this further.  PCI options including treating the LAD with PCI and leaving everything else to be treated medically.  However, that will not lead to complete revascularization.   Echo 01/14/20  1. Left ventricular ejection fraction, by estimation, is 50 to 55%. The  left ventricle has low normal function. LV endocardial border not  optimally defined to evaluate regional wall motion. Left ventricular  diastolic parameters were normal.   2. Right ventricular systolic function is normal. The right ventricular  size is normal. Tricuspid regurgitation signal is inadequate for assessing  PA pressure.   3. The mitral valve is normal in structure and function. Trivial mitral  valve regurgitation. No evidence of mitral stenosis.   4. The aortic valve is normal in structure and function. Aortic valve  regurgitation is not visualized. No aortic stenosis is present.   5. The inferior vena cava is normal in size with greater than 50%  respiratory variability, suggesting right atrial pressure of 3 mmHg.   EKG:  EKG is ordered today.  The ekg ordered today demonstrates sinus rhythm 78 bpm with T wave inversion in inferior leads which is stable compared to previous..  Recent Labs: 01/19/2020: Magnesium 1.6 01/21/2020: BUN 7; Creatinine, Ser 0.92; Hemoglobin 9.9; Platelets 201; Potassium 4.5; Sodium 136  Recent Lipid Panel    Component Value Date/Time   CHOL 197 01/14/2020 1415   TRIG 234 (H) 01/14/2020 1415   HDL 38 (L) 01/14/2020 1415   CHOLHDL 5.2 01/14/2020 1415   VLDL 47 (H) 01/14/2020 1415   LDLCALC 112 (H) 01/14/2020 1415    Home Medications   No outpatient medications have been marked as taking for the 02/05/20 encounter (Appointment) with Loel Dubonnet, NP.    Review of Systems      Review of Systems  Constitution: Negative for chills,  fever and malaise/fatigue.  Cardiovascular: Negative for chest pain, dyspnea on exertion, leg swelling, near-syncope, orthopnea, palpitations and syncope.  Respiratory: Negative for cough, shortness of breath and wheezing.   Gastrointestinal: Negative for nausea and vomiting.  Neurological: Negative for dizziness, light-headedness and weakness.   All other systems reviewed and are otherwise negative except as noted above.  Physical Exam    VS:  LMP 11/25/2017  , BMI There is no height or weight on file to calculate BMI. GEN: Well nourished, well developed, in no acute distress. HEENT: normal. Neck: Supple, no JVD, carotid bruits, or masses. Cardiac: RRR, no murmurs, rubs, or gallops. No clubbing, cyanosis, edema.  Radials/PT 2+ and equal bilaterally.  Respiratory:  Respirations regular and unlabored, clear to auscultation bilaterally. GI: Soft, nontender, nondistended, BS + x 4. MS: No deformity or atrophy. Skin: Warm and dry, no rash.  Midsternal incision clean, dry, intact.  Healing appropriately with no ecchymosis, erythema, signs or symptoms of infection. Neuro:  Strength and sensation are intact. Psych: Normal affect.  Accessory Clinical Findings    ECG personally reviewed  by me today -sinus rhythm 70 bpm with stable T wave inversion in inferior leads.- no acute changes.  Assessment & Plan    1. CAD s/p CABGx3 - Stable with no anginal symptoms. Midsternal incision healing appropriately. GDMT includes Aspirin 325mg  (until 3 mos postop), beta blocker, high intensity statin, PRN Nitroglycerin. Low sodium, heart healthy diet recommended. Gradual return to regular cardiovascular exercise encouraged. Plans to participate in cardiac rehab.   2. HLD, LDL goal <70 -started on atorvastatin 80 mg daily during the hospitalization.  We will plan for repeat lipid panel and liver function at next office visit.  3. HTN - Blood pressure well controlled.  Continue Toprol 50 mg daily. Refill sent.     4. HFrEF - Prior EF 45-50%. Echo 01/14/20 with LVEF 50-55% and EF by LV gram of 45-50%. Euvolemic on exam today, no indication for diuretic. Consider addition of ACE/ARB at future date if needed for BP control.   5. Tobacco abuse - Has not smoked since leaving hospital, very motivated to quit. Congratulated on her efforts. Smoking cessation encouraged. Recommend utilization of 1800QUITNOW.  6. Hyperglycemia - A1c 5.7 during recent hospitalization.  We discussed reducing intake of carbohydrates and sugars.  She is working to establish with a PCP through her insurance company.  Disposition: Follow up in 2 month(s) with Dr. 01/16/20 or APP  Mariah Milling, NP 02/04/2020, 8:36 PM

## 2020-02-05 ENCOUNTER — Encounter: Payer: Self-pay | Admitting: Family

## 2020-02-05 ENCOUNTER — Other Ambulatory Visit: Payer: Self-pay

## 2020-02-05 ENCOUNTER — Ambulatory Visit (INDEPENDENT_AMBULATORY_CARE_PROVIDER_SITE_OTHER): Payer: BC Managed Care – PPO | Admitting: Family

## 2020-02-05 VITALS — BP 120/80 | HR 78 | Ht 62.0 in | Wt 174.2 lb

## 2020-02-05 DIAGNOSIS — Z951 Presence of aortocoronary bypass graft: Secondary | ICD-10-CM

## 2020-02-05 DIAGNOSIS — I10 Essential (primary) hypertension: Secondary | ICD-10-CM

## 2020-02-05 DIAGNOSIS — I25118 Atherosclerotic heart disease of native coronary artery with other forms of angina pectoris: Secondary | ICD-10-CM

## 2020-02-05 DIAGNOSIS — Z720 Tobacco use: Secondary | ICD-10-CM | POA: Diagnosis not present

## 2020-02-05 DIAGNOSIS — E785 Hyperlipidemia, unspecified: Secondary | ICD-10-CM | POA: Diagnosis not present

## 2020-02-05 MED ORDER — ATORVASTATIN CALCIUM 80 MG PO TABS: 80.0000 mg | ORAL_TABLET | Freq: Every day | ORAL | 1 refills | Status: DC

## 2020-02-05 MED ORDER — METOPROLOL SUCCINATE ER 50 MG PO TB24: 50.0000 mg | ORAL_TABLET | Freq: Every day | ORAL | 1 refills | Status: DC

## 2020-02-05 MED ORDER — NITROGLYCERIN 0.4 MG SL SUBL: 0.4000 mg | SUBLINGUAL_TABLET | SUBLINGUAL | 3 refills | Status: DC | PRN

## 2020-02-05 NOTE — Patient Instructions (Addendum)
Medication Instructions:  A new prescription for nitroglycerin was sent to your pharmacy.   Refill of Atorvastatin and Metoprolol were sent to your pharmacy.   *If you need a refill on your cardiac medications before your next appointment, please call your pharmacy*  Lab Work: No lab work today.  We will get get cholesterol panel next time we see you. Please be fasting for this lab.  Testing/Procedures: Your EKG shows normal sinus rhythm which is a good result.   Follow-Up: At [facility], you and your health needs are our priority.  As part of our continuing mission to provide you with exceptional heart care, we have created designated Provider Care Teams.  These Care Teams include your primary Cardiologist (physician) and Advanced Practice Providers (APPs -  Physician Assistants and Nurse Practitioners) who all work together to provide you with the care you need, when you need it.  We recommend signing up for the patient portal called "MyChart".  Sign up information is provided on this After Visit Summary.  MyChart is used to connect with patients for Virtual Visits (Telemedicine).  Patients are able to view lab/test results, encounter notes, upcoming appointments, etc.  Non-urgent messages can be sent to your provider as well.   To learn more about what you can do with MyChart, go to [email or web address].    Your next appointment:  Dr. Mariah Milling preferred in-person in 2 months   If you don't hear from cardiac rehab after your next appointment with Dr. Cliffton Asters you can call them at [phone]

## 2020-02-06 ENCOUNTER — Other Ambulatory Visit: Payer: Self-pay

## 2020-02-06 DIAGNOSIS — I25118 Atherosclerotic heart disease of native coronary artery with other forms of angina pectoris: Secondary | ICD-10-CM

## 2020-02-06 MED ORDER — METOPROLOL SUCCINATE ER 50 MG PO TB24: 50.0000 mg | ORAL_TABLET | Freq: Every day | ORAL | 3 refills | Status: DC

## 2020-02-21 ENCOUNTER — Other Ambulatory Visit: Payer: Self-pay | Admitting: Thoracic Surgery (Cardiothoracic Vascular Surgery)

## 2020-02-21 DIAGNOSIS — Z951 Presence of aortocoronary bypass graft: Secondary | ICD-10-CM

## 2020-02-22 ENCOUNTER — Ambulatory Visit
Admission: RE | Admit: 2020-02-22 | Discharge: 2020-02-22 | Disposition: A | Discharge status: Home / Self Care | Payer: BC Managed Care – PPO | Source: Ambulatory Visit | Attending: Thoracic Surgery (Cardiothoracic Vascular Surgery) | Admitting: Thoracic Surgery (Cardiothoracic Vascular Surgery)

## 2020-02-22 ENCOUNTER — Other Ambulatory Visit: Payer: Self-pay

## 2020-02-22 ENCOUNTER — Ambulatory Visit (INDEPENDENT_AMBULATORY_CARE_PROVIDER_SITE_OTHER): Payer: Self-pay | Admitting: Thoracic Surgery (Cardiothoracic Vascular Surgery)

## 2020-02-22 ENCOUNTER — Encounter: Payer: Self-pay | Admitting: Thoracic Surgery (Cardiothoracic Vascular Surgery)

## 2020-02-22 VITALS — BP 150/99 | HR 72 | Temp 98.2°F | Resp 20 | Ht 62.0 in | Wt 176.0 lb

## 2020-02-22 DIAGNOSIS — R918 Other nonspecific abnormal finding of lung field: Secondary | ICD-10-CM | POA: Diagnosis not present

## 2020-02-22 DIAGNOSIS — Z951 Presence of aortocoronary bypass graft: Secondary | ICD-10-CM

## 2020-02-22 DIAGNOSIS — I2510 Atherosclerotic heart disease of native coronary artery without angina pectoris: Secondary | ICD-10-CM

## 2020-02-22 NOTE — Progress Notes (Signed)
      [address]       [place] [address]             [phone]        Victoria Avery [facility] Medical Record #[id number] Date of Birth: [birth year]  Referring: Iran Ouch, MD Primary Care: Patient, No Pcp Per Primary Cardiologist:Timothy Mariah Milling, MD  Reason for visit:   follow-up  History of Present Illness:     [name] comes in for second follow-up appointment.  She is doing very well.  She has some mild tightness along her right chest but this appears to be mostly musculoskeletal.  Otherwise she is doing well  Physical Exam: BP (!) 150/99   Pulse 72   Temp 98.2 F (36.8 C) (Skin)   Resp 20   Ht 5\' 2"  (1.575 m)   Wt 176 lb (79.8 kg)   LMP 11/25/2017   SpO2 95% Comment: RA  BMI 32.19 kg/m   Alert NAD Incision well-healed.  Sternum stable Abdomen soft, ND No peripheral edema   Diagnostic Studies & Laboratory data: CXR: Clear     Assessment / Plan:   54 year old female status post CABG.  Doing well. Cleared for cardiac rehab Will follow up as needed.   44 02/22/2020 3:01 PM

## 2020-02-26 ENCOUNTER — Other Ambulatory Visit: Payer: Self-pay

## 2020-02-26 ENCOUNTER — Encounter: Payer: BC Managed Care – PPO | Attending: Cardiovascular Disease

## 2020-02-26 DIAGNOSIS — Z951 Presence of aortocoronary bypass graft: Secondary | ICD-10-CM

## 2020-02-26 NOTE — Progress Notes (Signed)
Virtual Orientation performed. Patient informed when to come in for RD and EP orientation. Diagnosis can be found in [facility] 01/16/2020.

## 2020-02-28 ENCOUNTER — Other Ambulatory Visit: Payer: Self-pay

## 2020-02-28 ENCOUNTER — Encounter: Payer: BC Managed Care – PPO | Attending: Cardiovascular Disease | Admitting: *Deleted

## 2020-02-28 VITALS — Ht 63.1 in | Wt 178.3 lb

## 2020-02-28 DIAGNOSIS — Z79899 Other long term (current) drug therapy: Secondary | ICD-10-CM | POA: Diagnosis not present

## 2020-02-28 DIAGNOSIS — I429 Cardiomyopathy, unspecified: Secondary | ICD-10-CM | POA: Insufficient documentation

## 2020-02-28 DIAGNOSIS — I10 Essential (primary) hypertension: Secondary | ICD-10-CM | POA: Insufficient documentation

## 2020-02-28 DIAGNOSIS — F17210 Nicotine dependence, cigarettes, uncomplicated: Secondary | ICD-10-CM | POA: Diagnosis not present

## 2020-02-28 DIAGNOSIS — Z951 Presence of aortocoronary bypass graft: Secondary | ICD-10-CM

## 2020-02-28 DIAGNOSIS — Z7982 Long term (current) use of aspirin: Secondary | ICD-10-CM | POA: Diagnosis not present

## 2020-02-28 NOTE — Patient Instructions (Signed)
Patient Instructions  Patient Details  Name: Victoria Avery MRN: [id number] Date of Birth: 05-04-1966 Referring Provider:  Antonieta Iba, MD  Below are your personal goals for exercise, nutrition, and risk factors. Our goal is to help you stay on track towards obtaining and maintaining these goals. We will be discussing your progress on these goals with you throughout the program.  Initial Exercise Prescription: Initial Exercise Prescription - 02/28/20 1200      Date of Initial Exercise RX and Referring Provider   Date  02/28/20    Referring Provider  Julien Nordmann MD      Treadmill   MPH  2.6    Grade  1    Minutes  15    METs  3.35      Recumbant Bike   Level  4    RPM  50    Watts  59    Minutes  15    METs  3      NuStep   Level  4    SPM  80    Minutes  15    METs  3      Recumbant Elliptical   Level  2    RPM  50    Minutes  15    METs  3      Prescription Details   Frequency (times per week)  3    Duration  Progress to 30 minutes of continuous aerobic without signs/symptoms of physical distress      Intensity   THRR 40-80% of Max Heartrate  121-183    Ratings of Perceived Exertion  11-13    Perceived Dyspnea  0-4      Progression   Progression  Continue to progress workloads to maintain intensity without signs/symptoms of physical distress.      Resistance Training   Training Prescription  Yes    Weight  3 lb    Reps  10-15       Exercise Goals: Frequency: Be able to perform aerobic exercise two to three times per week in program working toward 2-5 days per week of home exercise.  Intensity: Work with a perceived exertion of 11 (fairly light) - 15 (hard) while following your exercise prescription.  We will make changes to your prescription with you as you progress through the program.   Duration: Be able to do 30 to 45 minutes of continuous aerobic exercise in addition to a 5 minute warm-up and a 5 minute cool-down routine.    Nutrition Goals: Your personal nutrition goals will be established when you do your nutrition analysis with the dietician.  The following are general nutrition guidelines to follow: Cholesterol < 200mg /day Sodium < 1500mg /day Fiber: Women over 50 yrs - 21 grams per day  Personal Goals: Personal Goals and Risk Factors at Admission - 02/28/20 1245      Core Components/Risk Factors/Patient Goals on Admission    Weight Management  Yes;Weight Loss;Obesity    Intervention  Weight Management: Develop a combined nutrition and exercise program designed to reach desired caloric intake, while maintaining appropriate intake of nutrient and fiber, sodium and fats, and appropriate energy expenditure required for the weight goal.;Weight Management: Provide education and appropriate resources to help participant work on and attain dietary goals.;Weight Management/Obesity: Establish reasonable short term and long term weight goals.;Obesity: Provide education and appropriate resources to help participant work on and attain dietary goals.    Admit Weight  178 lb 4.8 oz (80.9 kg)  Goal Weight: Short Term  173 lb (78.5 kg)    Goal Weight: Long Term  168 lb (76.2 kg)    Expected Outcomes  Short Term: Continue to assess and modify interventions until short term weight is achieved;Long Term: Adherence to nutrition and physical activity/exercise program aimed toward attainment of established weight goal;Weight Loss: Understanding of general recommendations for a balanced deficit meal plan, which promotes 1-2 lb weight loss per week and includes a negative energy balance of [phone] kcal/d;Understanding recommendations for meals to include 15-35% energy as protein, 25-35% energy from fat, 35-60% energy from carbohydrates, less than 200mg  of dietary cholesterol, 20-35 gm of total fiber daily;Understanding of distribution of calorie intake throughout the day with the consumption of 4-5 meals/snacks    Tobacco Cessation   Yes    Number of packs per day  2 cigs per day    Intervention  Assist the participant in steps to quit. Provide individualized education and counseling about committing to Tobacco Cessation, relapse prevention, and pharmacological support that can be provided by physician.;Advice worker, assist with locating and accessing local/national Quit Smoking programs, and support quit date choice.    Expected Outcomes  Short Term: Will demonstrate readiness to quit, by selecting a quit date.;Short Term: Will quit all tobacco product use, adhering to prevention of relapse plan.;Long Term: Complete abstinence from all tobacco products for at least 12 months from quit date.    Hypertension  Yes    Intervention  Provide education on lifestyle modifcations including regular physical activity/exercise, weight management, moderate sodium restriction and increased consumption of fresh fruit, vegetables, and low fat dairy, alcohol moderation, and smoking cessation.;Monitor prescription use compliance.    Expected Outcomes  Short Term: Continued assessment and intervention until BP is < 140/3mm HG in hypertensive participants. < 130/73mm HG in hypertensive participants with diabetes, heart failure or chronic kidney disease.;Long Term: Maintenance of blood pressure at goal levels.    Lipids  Yes    Intervention  Provide education and support for participant on nutrition & aerobic/resistive exercise along with prescribed medications to achieve LDL 70mg , HDL >40mg .    Expected Outcomes  Long Term: Cholesterol controlled with medications as prescribed, with individualized exercise RX and with personalized nutrition plan. Value goals: LDL < 70mg , HDL > 40 mg.;Short Term: Participant states understanding of desired cholesterol values and is compliant with medications prescribed. Participant is following exercise prescription and nutrition guidelines.       Tobacco Use Initial Evaluation: Social History    Tobacco Use  Smoking Status Current Every Day Smoker  . Packs/day: 0.25  . Years: 30.00  . Pack years: 7.50  . Types: Cigarettes  Smokeless Tobacco Never Used    Exercise Goals and Review: Exercise Goals    Row Name 02/28/20 1244             Exercise Goals   Increase Physical Activity  Yes       Intervention  Provide advice, education, support and counseling about physical activity/exercise needs.;Develop an individualized exercise prescription for aerobic and resistive training based on initial evaluation findings, risk stratification, comorbidities and participant's personal goals.       Expected Outcomes  Short Term: Attend rehab on a regular basis to increase amount of physical activity.;Long Term: Add in home exercise to make exercise part of routine and to increase amount of physical activity.;Long Term: Exercising regularly at least 3-5 days a week.       Increase Strength and Stamina  Yes  Intervention  Provide advice, education, support and counseling about physical activity/exercise needs.;Develop an individualized exercise prescription for aerobic and resistive training based on initial evaluation findings, risk stratification, comorbidities and participant's personal goals.       Expected Outcomes  Short Term: Increase workloads from initial exercise prescription for resistance, speed, and METs.;Long Term: Improve cardiorespiratory fitness, muscular endurance and strength as measured by increased METs and functional capacity ( );Short Term: Perform resistance training exercises routinely during rehab and add in resistance training at home       Able to understand and use rate of perceived exertion (RPE) scale  Yes       Intervention  Provide education and explanation on how to use RPE scale       Expected Outcomes  Short Term: Able to use RPE daily in rehab to express subjective intensity level;Long Term:  Able to use RPE to guide intensity level when exercising  independently       Able to understand and use Dyspnea scale  Yes       Intervention  Provide education and explanation on how to use Dyspnea scale       Expected Outcomes  Short Term: Able to use Dyspnea scale daily in rehab to express subjective sense of shortness of breath during exertion;Long Term: Able to use Dyspnea scale to guide intensity level when exercising independently       Knowledge and understanding of Target Heart Rate Range (THRR)  Yes       Intervention  Provide education and explanation of THRR including how the numbers were predicted and where they are located for reference       Expected Outcomes  Short Term: Able to state/look up THRR;Short Term: Able to use daily as guideline for intensity in rehab;Long Term: Able to use THRR to govern intensity when exercising independently       Able to check pulse independently  Yes       Intervention  Provide education and demonstration on how to check pulse in carotid and radial arteries.;Review the importance of being able to check your own pulse for safety during independent exercise       Expected Outcomes  Short Term: Able to explain why pulse checking is important during independent exercise;Long Term: Able to check pulse independently and accurately       Understanding of Exercise Prescription  Yes       Intervention  Provide education, explanation, and written materials on patient's individual exercise prescription       Expected Outcomes  Short Term: Able to explain program exercise prescription;Long Term: Able to explain home exercise prescription to exercise independently          Copy of goals given to participant.

## 2020-02-28 NOTE — Progress Notes (Signed)
Cardiac Individual Treatment Plan  Patient Details  Name: Victoria Avery MRN: [id number] Date of Birth: 06/13/1966 Referring Provider:     Cardiac Rehab from 02/28/2020 in [facility] Cardiac and Pulmonary Rehab  Referring Provider  Ida Rogue MD      Initial Encounter Date:    Cardiac Rehab from 02/28/2020 in [facility] Cardiac and Pulmonary Rehab  Date  02/28/20      Visit Diagnosis: S/P CABG x 3  Patient's Home Medications on Admission:  Current Outpatient Medications:  .  acetaminophen (TYLENOL) 500 MG tablet, Take 2 tablets (1,000 mg total) by mouth every 6 (six) hours as needed., Disp: 30 tablet, Rfl: 0 .  aspirin EC 325 MG EC tablet, Take 1 tablet (325 mg total) by mouth daily., Disp: 30 tablet, Rfl: 0 .  atorvastatin (LIPITOR) 80 MG tablet, Take 1 tablet (80 mg total) by mouth daily at 6 PM., Disp: 90 tablet, Rfl: 1 .  metoprolol succinate (TOPROL-XL) 50 MG 24 hr tablet, Take 1 tablet (50 mg total) by mouth daily. Take with or immediately following a meal., Disp: 90 tablet, Rfl: 3 .  nitroGLYCERIN (NITROSTAT) 0.4 MG SL tablet, Place 1 tablet (0.4 mg total) under the tongue every 5 (five) minutes as needed for chest pain., Disp: 25 tablet, Rfl: 3  Past Medical History: Past Medical History:  Diagnosis Date  . Cardiomyopathy ([place])    a. 10/2017 Echo: EF 45-50% Gr1 DD, mild MR. Nl LA size. Nl RV fxn and PASP.  Marland Kitchen Hypertension   . Tobacco abuse     Tobacco Use: Social History   Tobacco Use  Smoking Status Current Every Day Smoker  . Packs/day: 0.25  . Years: 30.00  . Pack years: 7.50  . Types: Cigarettes  Smokeless Tobacco Never Used    Labs: Recent Review Flowsheet Data    Labs for ITP Cardiac and Pulmonary Rehab Latest Ref Rng & Units 01/18/2020 01/18/2020 01/18/2020 01/18/2020 01/18/2020   Cholestrol 0 - 200 mg/dL - - - - -   LDLCALC 0 - 99 mg/dL - - - - -   HDL >40 mg/dL - - - - -   Trlycerides <150 mg/dL - - - - -   Hemoglobin A1c 4.8 - 5.6 % - - - - -   PHART  7.350 - 7.450 - 7.318(L) 7.324(L) 7.338(L) 7.357   PCO2ART 32.0 - 48.0 mmHg - 45.5 45.9 35.0 44.3   HCO3 20.0 - 28.0 mmol/L - 23.3 24.2 18.7(L) 24.6   TCO2 22 - 32 mmol/L _0 20(L) 26   ACIDBASEDEF 0.0 - 2.0 mmol/L - 3.0(H) 2.0 6.0(H) 1.0   O2SAT % - 98.0 99.0 99.0 98.0       Exercise Target Goals: Exercise Program Goal: Individual exercise prescription set using results from initial 6 min walk test and THRR while considering  patient's activity barriers and safety.   Exercise Prescription Goal: Initial exercise prescription builds to 30-45 minutes a day of aerobic activity, 2-3 days per week.  Home exercise guidelines will be given to patient during program as part of exercise prescription that the participant will acknowledge.   Education: Aerobic Exercise & Resistance Training: - Gives group verbal and written instruction on the various components of exercise. Focuses on aerobic and resistive training programs and the benefits of this training and how to safely progress through these programs..   Education: Exercise & Equipment Safety: - Individual verbal instruction and demonstration of equipment use and safety with use of the equipment.  Cardiac Rehab from 02/26/2020 in [facility] Cardiac and Pulmonary Rehab  Date  02/26/20  Educator  [facility]  Instruction Review Code  1- Verbalizes Understanding      Education: Exercise Physiology & General Exercise Guidelines: - Group verbal and written instruction with models to review the exercise physiology of the cardiovascular system and associated critical values. Provides general exercise guidelines with specific guidelines to those with heart or lung disease.    Education: Flexibility, Balance, Mind/Body Relaxation: Provides group verbal/written instruction on the benefits of flexibility and balance training, including mind/body exercise modes such as yoga, pilates and tai chi.  Demonstration and skill practice provided.   Activity  Barriers & Risk Stratification: Activity Barriers & Cardiac Risk Stratification - 02/28/20 1242      Activity Barriers & Cardiac Risk Stratification   Activity Barriers  Deconditioning;Muscular Weakness;Shortness of Breath    Cardiac Risk Stratification  High       6 Minute Walk: 6 Minute Walk    Row Name 02/28/20 1241         6 Minute Walk   Phase  Initial     Distance  1415 feet     Walk Time  6 minutes     # of Rest Breaks  0     MPH  2.68     METS  4.33     RPE  11     Perceived Dyspnea   2     VO2 Peak  15.14     Symptoms  Yes (comment)     Comments  SOB     Resting HR  89 bpm     Resting BP  136/74     Resting Oxygen Saturation   99 %     Exercise Oxygen Saturation  during 6 min walk  98 %     Max Ex. HR  117 bpm     Max Ex. BP  166/76     2 Minute Post BP  144/74        Oxygen Initial Assessment:   Oxygen Re-Evaluation:   Oxygen Discharge (Final Oxygen Re-Evaluation):   Initial Exercise Prescription: Initial Exercise Prescription - 02/28/20 1200      Date of Initial Exercise RX and Referring Provider   Date  02/28/20    Referring Provider  Ida Rogue MD      Treadmill   MPH  2.6    Grade  1    Minutes  15    METs  3.35      Recumbant Bike   Level  4    RPM  50    Watts  59    Minutes  15    METs  3      NuStep   Level  4    SPM  80    Minutes  15    METs  3      Recumbant Elliptical   Level  2    RPM  50    Minutes  15    METs  3      Prescription Details   Frequency (times per week)  3    Duration  Progress to 30 minutes of continuous aerobic without signs/symptoms of physical distress      Intensity   THRR 40-80% of Max Heartrate  121-183    Ratings of Perceived Exertion  11-13    Perceived Dyspnea  0-4      Progression   Progression  Continue to  progress workloads to maintain intensity without signs/symptoms of physical distress.      Resistance Training   Training Prescription  Yes    Weight  3 lb    Reps   10-15       Perform Capillary Blood Glucose checks as needed.  Exercise Prescription Changes:  Exercise Prescription Changes    Row Name 02/28/20 1200             Response to Exercise   Blood Pressure (Admit)  134/74       Blood Pressure (Exercise)  166/76       Blood Pressure (Exit)  144/74       Heart Rate (Admit)  89 bpm       Heart Rate (Exercise)  117 bpm       Heart Rate (Exit)  90 bpm       Oxygen Saturation (Admit)  99 %       Oxygen Saturation (Exercise)  98 %       Rating of Perceived Exertion (Exercise)  11       Perceived Dyspnea (Exercise)  2       Symptoms  SOB       Comments  walk test results          Exercise Comments:   Exercise Goals and Review:  Exercise Goals    Row Name 02/28/20 1244             Exercise Goals   Increase Physical Activity  Yes       Intervention  Provide advice, education, support and counseling about physical activity/exercise needs.;Develop an individualized exercise prescription for aerobic and resistive training based on initial evaluation findings, risk stratification, comorbidities and participant's personal goals.       Expected Outcomes  Short Term: Attend rehab on a regular basis to increase amount of physical activity.;Long Term: Add in home exercise to make exercise part of routine and to increase amount of physical activity.;Long Term: Exercising regularly at least 3-5 days a week.       Increase Strength and Stamina  Yes       Intervention  Provide advice, education, support and counseling about physical activity/exercise needs.;Develop an individualized exercise prescription for aerobic and resistive training based on initial evaluation findings, risk stratification, comorbidities and participant's personal goals.       Expected Outcomes  Short Term: Increase workloads from initial exercise prescription for resistance, speed, and METs.;Long Term: Improve cardiorespiratory fitness, muscular endurance and strength as  measured by increased METs and functional capacity (6MWT);Short Term: Perform resistance training exercises routinely during rehab and add in resistance training at home       Able to understand and use rate of perceived exertion (RPE) scale  Yes       Intervention  Provide education and explanation on how to use RPE scale       Expected Outcomes  Short Term: Able to use RPE daily in rehab to express subjective intensity level;Long Term:  Able to use RPE to guide intensity level when exercising independently       Able to understand and use Dyspnea scale  Yes       Intervention  Provide education and explanation on how to use Dyspnea scale       Expected Outcomes  Short Term: Able to use Dyspnea scale daily in rehab to express subjective sense of shortness of breath during exertion;Long Term: Able to use Dyspnea scale to guide intensity  level when exercising independently       Knowledge and understanding of Target Heart Rate Range (THRR)  Yes       Intervention  Provide education and explanation of THRR including how the numbers were predicted and where they are located for reference       Expected Outcomes  Short Term: Able to state/look up THRR;Short Term: Able to use daily as guideline for intensity in rehab;Long Term: Able to use THRR to govern intensity when exercising independently       Able to check pulse independently  Yes       Intervention  Provide education and demonstration on how to check pulse in carotid and radial arteries.;Review the importance of being able to check your own pulse for safety during independent exercise       Expected Outcomes  Short Term: Able to explain why pulse checking is important during independent exercise;Long Term: Able to check pulse independently and accurately       Understanding of Exercise Prescription  Yes       Intervention  Provide education, explanation, and written materials on patient's individual exercise prescription       Expected Outcomes   Short Term: Able to explain program exercise prescription;Long Term: Able to explain home exercise prescription to exercise independently          Exercise Goals Re-Evaluation :   Discharge Exercise Prescription (Final Exercise Prescription Changes): Exercise Prescription Changes - 02/28/20 1200      Response to Exercise   Blood Pressure (Admit)  134/74    Blood Pressure (Exercise)  166/76    Blood Pressure (Exit)  144/74    Heart Rate (Admit)  89 bpm    Heart Rate (Exercise)  117 bpm    Heart Rate (Exit)  90 bpm    Oxygen Saturation (Admit)  99 %    Oxygen Saturation (Exercise)  98 %    Rating of Perceived Exertion (Exercise)  11    Perceived Dyspnea (Exercise)  2    Symptoms  SOB    Comments  walk test results       Nutrition:  Target Goals: Understanding of nutrition guidelines, daily intake of sodium <15107m, cholesterol <2087m calories 30% from fat and 7% or less from saturated fats, daily to have 5 or more servings of fruits and vegetables.  Education: Controlling Sodium/Reading Food Labels -Group verbal and written material supporting the discussion of sodium use in heart healthy nutrition. Review and explanation with models, verbal and written materials for utilization of the food label.   Education: General Nutrition Guidelines/Fats and Fiber: -Group instruction provided by verbal, written material, models and posters to present the general guidelines for heart healthy nutrition. Gives an explanation and review of dietary fats and fiber.   Biometrics: Pre Biometrics - 02/28/20 1244      Pre Biometrics   Height  5' 3.1" (1.603 m)    Weight  178 lb 4.8 oz (80.9 kg)    BMI (Calculated)  31.47    Single Leg Stand  19.66 seconds        Nutrition Therapy Plan and Nutrition Goals:   Nutrition Assessments: Nutrition Assessments - 02/28/20 1329      MEDFICTS Scores   Pre Score  25       MEDIFICTS Score Key:          ?70 Need to make dietary changes           40-70 Heart Healthy Diet         ?  40 Therapeutic Level Cholesterol Diet  Nutrition Goals Re-Evaluation:   Nutrition Goals Discharge (Final Nutrition Goals Re-Evaluation):   Psychosocial: Target Goals: Acknowledge presence or absence of significant depression and/or stress, maximize coping skills, provide positive support system. Participant is able to verbalize types and ability to use techniques and skills needed for reducing stress and depression.   Education: Depression - Provides group verbal and written instruction on the correlation between heart/lung disease and depressed mood, treatment options, and the stigmas associated with seeking treatment.   Education: Sleep Hygiene -Provides group verbal and written instruction about how sleep can affect your health.  Define sleep hygiene, discuss sleep cycles and impact of sleep habits. Review good sleep hygiene tips.     Education: Stress and Anxiety: - Provides group verbal and written instruction about the health risks of elevated stress and causes of high stress.  Discuss the correlation between heart/lung disease and anxiety and treatment options. Review healthy ways to manage with stress and anxiety.    Initial Review & Psychosocial Screening: Initial Psych Review & Screening - 02/26/20 1255      Initial Review   Current issues with  None Identified      Family Dynamics   Good Support System?  Yes    Comments  She can look to her husband, son, brother, sisterin law, sister and co workers for support. She is being positive about improving her health and moving on with her life.      Barriers   Psychosocial barriers to participate in program  There are no identifiable barriers or psychosocial needs.;The patient should benefit from training in stress management and relaxation.      Screening Interventions   Interventions  Encouraged to exercise;Provide feedback about the scores to participant;To provide support and  resources with identified psychosocial needs    Expected Outcomes  Short Term goal: Utilizing psychosocial counselor, staff and physician to assist with identification of specific Stressors or current issues interfering with healing process. Setting desired goal for each stressor or current issue identified.;Long Term Goal: Stressors or current issues are controlled or eliminated.;Short Term goal: Identification and review with participant of any Quality of Life or Depression concerns found by scoring the questionnaire.;Long Term goal: The participant improves quality of Life and PHQ9 Scores as seen by post scores and/or verbalization of changes       Quality of Life Scores:  Quality of Life - 02/28/20 1328      Quality of Life   Select  Quality of Life      Quality of Life Scores   Health/Function Pre  26.13 %    Socioeconomic Pre  30 %    Psych/Spiritual Pre  27.21 %    Family Pre  28.8 %    GLOBAL Pre  27.54 %      Scores of 19 and below usually indicate a poorer quality of life in these areas.  A difference of  2-3 points is a clinically meaningful difference.  A difference of 2-3 points in the total score of the Quality of Life Index has been associated with significant improvement in overall quality of life, self-image, physical symptoms, and general health in studies assessing change in quality of life.  PHQ-9: Recent Review Flowsheet Data    Depression screen [facility] 2/9 02/28/2020   Decreased Interest 0   Down, Depressed, Hopeless 0   PHQ - 2 Score 0   Altered sleeping 0   Tired, decreased energy 1   Change  in appetite 0   Feeling bad or failure about yourself  0   Trouble concentrating 0   Moving slowly or fidgety/restless 0   Suicidal thoughts 0   PHQ-9 Score 1   Difficult doing work/chores Not difficult at all     Interpretation of Total Score  Total Score Depression Severity:  1-4 = Minimal depression, 5-9 = Mild depression, 10-14 = Moderate depression, 15-19 =  Moderately severe depression, 20-27 = Severe depression   Psychosocial Evaluation and Intervention: Psychosocial Evaluation - 02/26/20 1259      Psychosocial Evaluation & Interventions   Interventions  Encouraged to exercise with the program and follow exercise prescription    Comments  She can look to her husband, son, brother, sisterin law, sister and co workers for support. She is being positive about improving her health and moving on with her life.    Expected Outcomes  Short: Attend HeartTrack stress management education to decrease stress. Long: Maintain exercise Post HeartTrack to keep stress at a minimum.    Continue Psychosocial Services   Follow up required by staff       Psychosocial Re-Evaluation:   Psychosocial Discharge (Final Psychosocial Re-Evaluation):   Vocational Rehabilitation: Provide vocational rehab assistance to qualifying candidates.   Vocational Rehab Evaluation & Intervention:   Education: Education Goals: Education classes will be provided on a variety of topics geared toward better understanding of heart health and risk factor modification. Participant will state understanding/return demonstration of topics presented as noted by education test scores.  Learning Barriers/Preferences: Learning Barriers/Preferences - 02/26/20 1300      Learning Barriers/Preferences   Learning Barriers  Sight   wears glasses   Learning Preferences  None       General Cardiac Education Topics:  AED/CPR: - Group verbal and written instruction with the use of models to demonstrate the basic use of the AED with the basic ABC's of resuscitation.   Anatomy & Physiology of the Heart: - Group verbal and written instruction and models provide basic cardiac anatomy and physiology, with the coronary electrical and arterial systems. Review of Valvular disease and Heart Failure   Cardiac Procedures: - Group verbal and written instruction to review commonly prescribed  medications for heart disease. Reviews the medication, class of the drug, and side effects. Includes the steps to properly store meds and maintain the prescription regimen. (beta blockers and nitrates)   Cardiac Medications I: - Group verbal and written instruction to review commonly prescribed medications for heart disease. Reviews the medication, class of the drug, and side effects. Includes the steps to properly store meds and maintain the prescription regimen.   Cardiac Medications II: -Group verbal and written instruction to review commonly prescribed medications for heart disease. Reviews the medication, class of the drug, and side effects. (all other drug classes)    Go Sex-Intimacy & Heart Disease, Get SMART - Goal Setting: - Group verbal and written instruction through game format to discuss heart disease and the return to sexual intimacy. Provides group verbal and written material to discuss and apply goal setting through the application of the S.M.A.R.T. Method.   Other Matters of the Heart: - Provides group verbal, written materials and models to describe Stable Angina and Peripheral Artery. Includes description of the disease process and treatment options available to the cardiac patient.   Infection Prevention: - Provides verbal and written material to individual with discussion of infection control including proper hand washing and proper equipment cleaning during exercise session.  Cardiac Rehab from 02/26/2020 in [facility] Cardiac and Pulmonary Rehab  Date  02/26/20  Educator  [facility]  Instruction Review Code  1- Verbalizes Understanding      Falls Prevention: - Provides verbal and written material to individual with discussion of falls prevention and safety.   Cardiac Rehab from 02/26/2020 in [facility] [place] Cardiac and Pulmonary Rehab  Date  02/26/20  Educator  [facility]  Instruction Review Code  1- Verbalizes Understanding      Other: -Provides group and verbal instruction on various  topics (see comments)   Knowledge Questionnaire Score: Knowledge Questionnaire Score - 02/28/20 1329      Knowledge Questionnaire Score   Pre Score  24/26 Education Focus: Angina, Exercise       Core Components/Risk Factors/Patient Goals at Admission: Personal Goals and Risk Factors at Admission - 02/28/20 1245      Core Components/Risk Factors/Patient Goals on Admission    Weight Management  Yes;Weight Loss;Obesity    Intervention  Weight Management: Develop a combined nutrition and exercise program designed to reach desired caloric intake, while maintaining appropriate intake of nutrient and fiber, sodium and fats, and appropriate energy expenditure required for the weight goal.;Weight Management: Provide education and appropriate resources to help participant work on and attain dietary goals.;Weight Management/Obesity: Establish reasonable short term and long term weight goals.;Obesity: Provide education and appropriate resources to help participant work on and attain dietary goals.    Admit Weight  178 lb 4.8 oz (80.9 kg)    Goal Weight: Short Term  173 lb (78.5 kg)    Goal Weight: Long Term  168 lb (76.2 kg)    Expected Outcomes  Short Term: Continue to assess and modify interventions until short term weight is achieved;Long Term: Adherence to nutrition and physical activity/exercise program aimed toward attainment of established weight goal;Weight Loss: Understanding of general recommendations for a balanced deficit meal plan, which promotes 1-2 lb weight loss per week and includes a negative energy balance of [phone] kcal/d;Understanding recommendations for meals to include 15-35% energy as protein, 25-35% energy from fat, 35-60% energy from carbohydrates, less than 231m of dietary cholesterol, 20-35 gm of total fiber daily;Understanding of distribution of calorie intake throughout the day with the consumption of 4-5 meals/snacks    Tobacco Cessation  Yes    Number of packs per day  2  cigs per day    Intervention  Assist the participant in steps to quit. Provide individualized education and counseling about committing to Tobacco Cessation, relapse prevention, and pharmacological support that can be provided by physician.;OAdvice worker assist with locating and accessing local/national Quit Smoking programs, and support quit date choice.    Expected Outcomes  Short Term: Will demonstrate readiness to quit, by selecting a quit date.;Short Term: Will quit all tobacco product use, adhering to prevention of relapse plan.;Long Term: Complete abstinence from all tobacco products for at least 12 months from quit date.    Hypertension  Yes    Intervention  Provide education on lifestyle modifcations including regular physical activity/exercise, weight management, moderate sodium restriction and increased consumption of fresh fruit, vegetables, and low fat dairy, alcohol moderation, and smoking cessation.;Monitor prescription use compliance.    Expected Outcomes  Short Term: Continued assessment and intervention until BP is < 140/916mHG in hypertensive participants. < 130/8032mG in hypertensive participants with diabetes, heart failure or chronic kidney disease.;Long Term: Maintenance of blood pressure at goal levels.    Lipids  Yes    Intervention  Provide education  and support for participant on nutrition & aerobic/resistive exercise along with prescribed medications to achieve LDL <69m, HDL >460m    Expected Outcomes  Long Term: Cholesterol controlled with medications as prescribed, with individualized exercise RX and with personalized nutrition plan. Value goals: LDL < 7019mHDL > 40 mg.;Short Term: Participant states understanding of desired cholesterol values and is compliant with medications prescribed. Participant is following exercise prescription and nutrition guidelines.       Education:Diabetes - Individual verbal and written instruction to review signs/symptoms  of diabetes, desired ranges of glucose level fasting, after meals and with exercise. Acknowledge that pre and post exercise glucose checks will be done for 3 sessions at entry of program.   Education: Know Your Numbers and Risk Factors: -Group verbal and written instruction about important numbers in your health.  Discussion of what are risk factors and how they play a role in the disease process.  Review of Cholesterol, Blood Pressure, Diabetes, and BMI and the role they play in your overall health.   Core Components/Risk Factors/Patient Goals Review:    Core Components/Risk Factors/Patient Goals at Discharge (Final Review):    ITP Comments: ITP Comments    Row Name 02/26/20 1305 02/28/20 1241         ITP Comments  Virtual Orientation performed. Patient informed when to come in for RD and EP orientation. [facility].  Completed 6MWT and gym orientation.  Initial ITP created and sent for review to Dr. KleCaryl Comesvering for Dr. MarEmily Filbertedical Director.         Comments: Initial ITP

## 2020-03-04 ENCOUNTER — Other Ambulatory Visit: Payer: Self-pay

## 2020-03-04 ENCOUNTER — Encounter: Payer: BC Managed Care – PPO | Admitting: *Deleted

## 2020-03-04 DIAGNOSIS — Z951 Presence of aortocoronary bypass graft: Secondary | ICD-10-CM | POA: Diagnosis not present

## 2020-03-04 DIAGNOSIS — Z79899 Other long term (current) drug therapy: Secondary | ICD-10-CM | POA: Diagnosis not present

## 2020-03-04 DIAGNOSIS — I429 Cardiomyopathy, unspecified: Secondary | ICD-10-CM | POA: Diagnosis not present

## 2020-03-04 DIAGNOSIS — I10 Essential (primary) hypertension: Secondary | ICD-10-CM | POA: Diagnosis not present

## 2020-03-04 DIAGNOSIS — Z7982 Long term (current) use of aspirin: Secondary | ICD-10-CM | POA: Diagnosis not present

## 2020-03-04 DIAGNOSIS — F17210 Nicotine dependence, cigarettes, uncomplicated: Secondary | ICD-10-CM | POA: Diagnosis not present

## 2020-03-04 NOTE — Progress Notes (Signed)
Daily Session Note  Patient Details  Name: Victoria Avery MRN: [id number] Date of Birth: 1966-10-10 Referring Provider:     Cardiac Rehab from 02/28/2020 in [facility] Cardiac and Pulmonary Rehab  Referring Provider  Ida Rogue MD      Encounter Date: 03/04/2020  Check In: Session Check In - 03/04/20 1147      Check-In   Supervising physician immediately available to respond to emergencies  See telemetry face sheet for immediately available ER MD    Location  [facility]-Cardiac & Pulmonary Rehab    Staff Present  Heath Lark, RN, BSN, CCRP;Joseph Hood RCP,RRT,BSRT;Amanda Oletta Darter, [place], ACSM CEP, Exercise Physiologist    Virtual Visit  No    Medication changes reported      No    Fall or balance concerns reported     No    Warm-up and Cool-down  Performed on first and last piece of equipment    Resistance Training Performed  Yes    VAD Patient?  No    PAD/SET Patient?  No      Pain Assessment   Currently in Pain?  No/denies     Down to 5 cigarettes from a pack a day     Social History   Tobacco Use  Smoking Status Current Every Day Smoker  . Packs/day: 0.25  . Years: 30.00  . Pack years: 7.50  . Types: Cigarettes  Smokeless Tobacco Never Used    Goals Met:  Exercise tolerated well Personal goals reviewed No report of cardiac concerns or symptoms  Goals Unmet:  Not Applicable  Comments: First full day of exercise!  Patient was oriented to gym and equipment including functions, settings, policies, and procedures.  Patient's individual exercise prescription and treatment plan were reviewed.  All starting workloads were established based on the results of the 6 minute walk test done at initial orientation visit.  The plan for exercise progression was also introduced and progression will be customized based on patient's performance and goals.    Dr. Emily Filbert is Medical Director for [place] and [name].

## 2020-03-10 ENCOUNTER — Other Ambulatory Visit: Payer: Self-pay

## 2020-03-10 ENCOUNTER — Encounter: Payer: BC Managed Care – PPO | Admitting: *Deleted

## 2020-03-10 DIAGNOSIS — I429 Cardiomyopathy, unspecified: Secondary | ICD-10-CM | POA: Diagnosis not present

## 2020-03-10 DIAGNOSIS — Z7982 Long term (current) use of aspirin: Secondary | ICD-10-CM | POA: Diagnosis not present

## 2020-03-10 DIAGNOSIS — Z951 Presence of aortocoronary bypass graft: Secondary | ICD-10-CM

## 2020-03-10 DIAGNOSIS — I10 Essential (primary) hypertension: Secondary | ICD-10-CM | POA: Diagnosis not present

## 2020-03-10 DIAGNOSIS — Z79899 Other long term (current) drug therapy: Secondary | ICD-10-CM | POA: Diagnosis not present

## 2020-03-10 DIAGNOSIS — F17210 Nicotine dependence, cigarettes, uncomplicated: Secondary | ICD-10-CM | POA: Diagnosis not present

## 2020-03-10 NOTE — Progress Notes (Signed)
Daily Session Note  Patient Details  Name: Victoria Avery MRN: [id number] Date of Birth: 09/25/1966 Referring Provider:     Cardiac Rehab from 02/28/2020 in [facility] Cardiac and Pulmonary Rehab  Referring Provider  Ida Rogue MD      Encounter Date: 03/10/2020  Check In: Session Check In - 03/10/20 1116      Check-In   Supervising physician immediately available to respond to emergencies  See telemetry face sheet for immediately available ER MD    Location  [facility]-Cardiac & Pulmonary Rehab    Staff Present  Renita Papa, RN BSN;Joseph [address] [place], [place], ACSM CEP, Exercise Physiologist;Amanda Oletta Darter, [place], ACSM CEP, Exercise Physiologist    Virtual Visit  No    Medication changes reported      No    Fall or balance concerns reported     No    Warm-up and Cool-down  Performed on first and last piece of equipment    Resistance Training Performed  Yes    VAD Patient?  No    PAD/SET Patient?  No      Pain Assessment   Currently in Pain?  No/denies          Social History   Tobacco Use  Smoking Status Current Every Day Smoker  . Packs/day: 0.25  . Years: 30.00  . Pack years: 7.50  . Types: Cigarettes  Smokeless Tobacco Never Used    Goals Met:  Independence with exercise equipment Exercise tolerated well No report of cardiac concerns or symptoms Strength training completed today  Goals Unmet:  Not Applicable  Comments: Pt able to follow exercise prescription today without complaint.  Will continue to monitor for progression.    Dr. Emily Filbert is Medical Director for [place] and [name].

## 2020-03-12 ENCOUNTER — Encounter: Payer: BC Managed Care – PPO | Admitting: *Deleted

## 2020-03-12 ENCOUNTER — Other Ambulatory Visit: Payer: Self-pay

## 2020-03-12 DIAGNOSIS — I10 Essential (primary) hypertension: Secondary | ICD-10-CM | POA: Diagnosis not present

## 2020-03-12 DIAGNOSIS — Z7982 Long term (current) use of aspirin: Secondary | ICD-10-CM | POA: Diagnosis not present

## 2020-03-12 DIAGNOSIS — Z951 Presence of aortocoronary bypass graft: Secondary | ICD-10-CM | POA: Diagnosis not present

## 2020-03-12 DIAGNOSIS — I429 Cardiomyopathy, unspecified: Secondary | ICD-10-CM | POA: Diagnosis not present

## 2020-03-12 DIAGNOSIS — Z79899 Other long term (current) drug therapy: Secondary | ICD-10-CM | POA: Diagnosis not present

## 2020-03-12 DIAGNOSIS — F17210 Nicotine dependence, cigarettes, uncomplicated: Secondary | ICD-10-CM | POA: Diagnosis not present

## 2020-03-12 NOTE — Progress Notes (Signed)
Daily Session Note  Patient Details  Name: Victoria Avery MRN: [id number] Date of Birth: 09/12/1966 Referring Provider:     Cardiac Rehab from 02/28/2020 in [facility] Cardiac and Pulmonary Rehab  Referring Provider  Gollan, Timothy MD      Encounter Date: 03/12/2020  Check In: Session Check In - 03/12/20 1115      Check-In   Supervising physician immediately available to respond to emergencies  See telemetry face sheet for immediately available ER MD    Location  [facility]-Cardiac & Pulmonary Rehab    Staff Present  Meredith Craven, RN BSN;Joseph Hood RCP,RRT,BSRT;Amanda Sommer, BA, ACSM CEP, Exercise Physiologist;Melissa Caiola RDN, LDN    Virtual Visit  No    Medication changes reported      No    Tobacco Cessation  Use Increase   7 cigs   Current number of cigarettes/nicotine per day      7    Warm-up and Cool-down  Performed on first and last piece of equipment    Resistance Training Performed  Yes    VAD Patient?  No    PAD/SET Patient?  No      Pain Assessment   Currently in Pain?  No/denies          Social History   Tobacco Use  Smoking Status Current Every Day Smoker  . Packs/day: 0.25  . Years: 30.00  . Pack years: 7.50  . Types: Cigarettes  Smokeless Tobacco Never Used    Goals Met:  Independence with exercise equipment Exercise tolerated well No report of cardiac concerns or symptoms Strength training completed today  Goals Unmet:  Not Applicable  Comments: Pt able to follow exercise prescription today without complaint.  Will continue to monitor for progression.    Dr. Mark Miller is Medical Director for [facility] and [name]. 

## 2020-03-14 ENCOUNTER — Other Ambulatory Visit: Payer: Self-pay

## 2020-03-14 DIAGNOSIS — Z7982 Long term (current) use of aspirin: Secondary | ICD-10-CM | POA: Diagnosis not present

## 2020-03-14 DIAGNOSIS — I10 Essential (primary) hypertension: Secondary | ICD-10-CM | POA: Diagnosis not present

## 2020-03-14 DIAGNOSIS — Z79899 Other long term (current) drug therapy: Secondary | ICD-10-CM | POA: Diagnosis not present

## 2020-03-14 DIAGNOSIS — Z951 Presence of aortocoronary bypass graft: Secondary | ICD-10-CM

## 2020-03-14 DIAGNOSIS — F17210 Nicotine dependence, cigarettes, uncomplicated: Secondary | ICD-10-CM | POA: Diagnosis not present

## 2020-03-14 DIAGNOSIS — I429 Cardiomyopathy, unspecified: Secondary | ICD-10-CM | POA: Diagnosis not present

## 2020-03-14 NOTE — Progress Notes (Signed)
Daily Session Note  Patient Details  Name: Victoria Avery MRN: [id number] Date of Birth: July 24, 1966 Referring Provider:     Cardiac Rehab from 02/28/2020 in [facility] Cardiac and Pulmonary Rehab  Referring Provider  Ida Rogue MD      Encounter Date: 03/14/2020  Check In: Session Check In - 03/14/20 1144      Check-In   Supervising physician immediately available to respond to emergencies  See telemetry face sheet for immediately available ER MD    Location  [facility]-Cardiac & Pulmonary Rehab    Staff Present  Vida Rigger RN, Vickki Hearing, BA, ACSM CEP, Exercise Physiologist;Joseph Tessie Fass RCP,RRT,BSRT    Virtual Visit  No    Medication changes reported      No    Fall or balance concerns reported     No    Warm-up and Cool-down  Performed on first and last piece of equipment    Resistance Training Performed  Yes    PAD/SET Patient?  No      Pain Assessment   Currently in Pain?  No/denies          Social History   Tobacco Use  Smoking Status Current Every Day Smoker  . Packs/day: 0.25  . Years: 30.00  . Pack years: 7.50  . Types: Cigarettes  Smokeless Tobacco Never Used    Goals Met:  Independence with exercise equipment Exercise tolerated well No report of cardiac concerns or symptoms Strength training completed today  Goals Unmet:  Not Applicable  Comments: Pt able to follow exercise prescription today without complaint.  Will continue to monitor for progression.   Dr. Emily Filbert is Medical Director for [place] and [name].

## 2020-03-17 ENCOUNTER — Other Ambulatory Visit: Payer: Self-pay

## 2020-03-17 ENCOUNTER — Encounter: Payer: BC Managed Care – PPO | Admitting: *Deleted

## 2020-03-17 DIAGNOSIS — Z7982 Long term (current) use of aspirin: Secondary | ICD-10-CM | POA: Diagnosis not present

## 2020-03-17 DIAGNOSIS — F17210 Nicotine dependence, cigarettes, uncomplicated: Secondary | ICD-10-CM | POA: Diagnosis not present

## 2020-03-17 DIAGNOSIS — I10 Essential (primary) hypertension: Secondary | ICD-10-CM | POA: Diagnosis not present

## 2020-03-17 DIAGNOSIS — I429 Cardiomyopathy, unspecified: Secondary | ICD-10-CM | POA: Diagnosis not present

## 2020-03-17 DIAGNOSIS — Z951 Presence of aortocoronary bypass graft: Secondary | ICD-10-CM

## 2020-03-17 DIAGNOSIS — Z79899 Other long term (current) drug therapy: Secondary | ICD-10-CM | POA: Diagnosis not present

## 2020-03-17 NOTE — Progress Notes (Signed)
Daily Session Note  Patient Details  Name: NAJAH LIVERMAN MRN: [id number] Date of Birth: 1966/05/15 Referring Provider:     Cardiac Rehab from 02/28/2020 in [facility] Cardiac and Pulmonary Rehab  Referring Provider  Ida Rogue MD      Encounter Date: 03/17/2020  Check In: Session Check In - 03/17/20 1115      Check-In   Supervising physician immediately available to respond to emergencies  See telemetry face sheet for immediately available ER MD    Location  [facility]-Cardiac & Pulmonary Rehab    Staff Present  Renita Papa, RN Moises Blood, BS, ACSM CEP, Exercise Physiologist;Amanda Oletta Darter, [place], ACSM CEP, Exercise Physiologist    Virtual Visit  No    Medication changes reported      No    Fall or balance concerns reported     No    Warm-up and Cool-down  Performed on first and last piece of equipment    Resistance Training Performed  Yes    VAD Patient?  No    PAD/SET Patient?  No      Pain Assessment   Currently in Pain?  No/denies          Social History   Tobacco Use  Smoking Status Current Every Day Smoker  . Packs/day: 0.25  . Years: 30.00  . Pack years: 7.50  . Types: Cigarettes  Smokeless Tobacco Never Used    Goals Met:  Independence with exercise equipment Exercise tolerated well No report of cardiac concerns or symptoms Strength training completed today  Goals Unmet:  Not Applicable  Comments: Pt able to follow exercise prescription today without complaint.  Will continue to monitor for progression.    Dr. Emily Filbert is Medical Director for [place] and [name].

## 2020-03-18 ENCOUNTER — Other Ambulatory Visit: Payer: Self-pay

## 2020-03-18 DIAGNOSIS — Z951 Presence of aortocoronary bypass graft: Secondary | ICD-10-CM

## 2020-03-18 NOTE — Progress Notes (Signed)
Completed Initial RD Eval 

## 2020-03-19 ENCOUNTER — Encounter: Payer: Self-pay | Admitting: *Deleted

## 2020-03-19 ENCOUNTER — Other Ambulatory Visit: Payer: Self-pay

## 2020-03-19 ENCOUNTER — Encounter: Payer: BC Managed Care – PPO | Admitting: *Deleted

## 2020-03-19 DIAGNOSIS — Z7982 Long term (current) use of aspirin: Secondary | ICD-10-CM | POA: Diagnosis not present

## 2020-03-19 DIAGNOSIS — Z951 Presence of aortocoronary bypass graft: Secondary | ICD-10-CM

## 2020-03-19 DIAGNOSIS — I10 Essential (primary) hypertension: Secondary | ICD-10-CM | POA: Diagnosis not present

## 2020-03-19 DIAGNOSIS — Z79899 Other long term (current) drug therapy: Secondary | ICD-10-CM | POA: Diagnosis not present

## 2020-03-19 DIAGNOSIS — F17210 Nicotine dependence, cigarettes, uncomplicated: Secondary | ICD-10-CM | POA: Diagnosis not present

## 2020-03-19 DIAGNOSIS — I429 Cardiomyopathy, unspecified: Secondary | ICD-10-CM | POA: Diagnosis not present

## 2020-03-19 NOTE — Progress Notes (Signed)
Daily Session Note  Patient Details  Name: SHAKEENA KAFER MRN: [id number] Date of Birth: 15-Oct-1966 Referring Provider:     Cardiac Rehab from 02/28/2020 in [facility] Cardiac and Pulmonary Rehab  Referring Provider  Ida Rogue MD      Encounter Date: 03/19/2020  Check In: Session Check In - 03/19/20 1124      Check-In   Supervising physician immediately available to respond to emergencies  See telemetry face sheet for immediately available ER MD    Location  [facility]-Cardiac & Pulmonary Rehab    Staff Present  Renita Papa, RN BSN;Joseph Hood RCP,RRT,BSRT;Amanda Oletta Darter, [place], ACSM CEP, Exercise Physiologist    Virtual Visit  No    Medication changes reported      No    Fall or balance concerns reported     No    Resistance Training Performed  Yes    VAD Patient?  No    PAD/SET Patient?  No      Pain Assessment   Currently in Pain?  No/denies          Social History   Tobacco Use  Smoking Status Current Every Day Smoker  . Packs/day: 0.25  . Years: 30.00  . Pack years: 7.50  . Types: Cigarettes  Smokeless Tobacco Never Used    Goals Met:  Independence with exercise equipment Exercise tolerated well No report of cardiac concerns or symptoms Strength training completed today  Goals Unmet:  Not Applicable  Comments: Pt able to follow exercise prescription today without complaint.  Will continue to monitor for progression.    Dr. Emily Filbert is Medical Director for [place] and [name].

## 2020-03-19 NOTE — Progress Notes (Signed)
Cardiac Individual Treatment Plan  Patient Details  Name: Victoria Avery MRN: [id number] Date of Birth: 06/13/1966 Referring Provider:     Cardiac Rehab from 02/28/2020 in [facility] Cardiac and Pulmonary Rehab  Referring Provider  Ida Rogue MD      Initial Encounter Date:    Cardiac Rehab from 02/28/2020 in [facility] Cardiac and Pulmonary Rehab  Date  02/28/20      Visit Diagnosis: S/P CABG x 3  Patient's Home Medications on Admission:  Current Outpatient Medications:  .  acetaminophen (TYLENOL) 500 MG tablet, Take 2 tablets (1,000 mg total) by mouth every 6 (six) hours as needed., Disp: 30 tablet, Rfl: 0 .  aspirin EC 325 MG EC tablet, Take 1 tablet (325 mg total) by mouth daily., Disp: 30 tablet, Rfl: 0 .  atorvastatin (LIPITOR) 80 MG tablet, Take 1 tablet (80 mg total) by mouth daily at 6 PM., Disp: 90 tablet, Rfl: 1 .  metoprolol succinate (TOPROL-XL) 50 MG 24 hr tablet, Take 1 tablet (50 mg total) by mouth daily. Take with or immediately following a meal., Disp: 90 tablet, Rfl: 3 .  nitroGLYCERIN (NITROSTAT) 0.4 MG SL tablet, Place 1 tablet (0.4 mg total) under the tongue every 5 (five) minutes as needed for chest pain., Disp: 25 tablet, Rfl: 3  Past Medical History: Past Medical History:  Diagnosis Date  . Cardiomyopathy ([place])    a. 10/2017 Echo: EF 45-50% Gr1 DD, mild MR. Nl LA size. Nl RV fxn and PASP.  Marland Kitchen Hypertension   . Tobacco abuse     Tobacco Use: Social History   Tobacco Use  Smoking Status Current Every Day Smoker  . Packs/day: 0.25  . Years: 30.00  . Pack years: 7.50  . Types: Cigarettes  Smokeless Tobacco Never Used    Labs: Recent Review Flowsheet Data    Labs for ITP Cardiac and Pulmonary Rehab Latest Ref Rng & Units 01/18/2020 01/18/2020 01/18/2020 01/18/2020 01/18/2020   Cholestrol 0 - 200 mg/dL - - - - -   LDLCALC 0 - 99 mg/dL - - - - -   HDL >40 mg/dL - - - - -   Trlycerides <150 mg/dL - - - - -   Hemoglobin A1c 4.8 - 5.6 % - - - - -   PHART  7.350 - 7.450 - 7.318(L) 7.324(L) 7.338(L) 7.357   PCO2ART 32.0 - 48.0 mmHg - 45.5 45.9 35.0 44.3   HCO3 20.0 - 28.0 mmol/L - 23.3 24.2 18.7(L) 24.6   TCO2 22 - 32 mmol/L _0 20(L) 26   ACIDBASEDEF 0.0 - 2.0 mmol/L - 3.0(H) 2.0 6.0(H) 1.0   O2SAT % - 98.0 99.0 99.0 98.0       Exercise Target Goals: Exercise Program Goal: Individual exercise prescription set using results from initial 6 min walk test and THRR while considering  patient's activity barriers and safety.   Exercise Prescription Goal: Initial exercise prescription builds to 30-45 minutes a day of aerobic activity, 2-3 days per week.  Home exercise guidelines will be given to patient during program as part of exercise prescription that the participant will acknowledge.   Education: Aerobic Exercise & Resistance Training: - Gives group verbal and written instruction on the various components of exercise. Focuses on aerobic and resistive training programs and the benefits of this training and how to safely progress through these programs..   Education: Exercise & Equipment Safety: - Individual verbal instruction and demonstration of equipment use and safety with use of the equipment.  Cardiac Rehab from 02/26/2020 in [facility] Cardiac and Pulmonary Rehab  Date  02/26/20  Educator  [facility]  Instruction Review Code  1- Verbalizes Understanding      Education: Exercise Physiology & General Exercise Guidelines: - Group verbal and written instruction with models to review the exercise physiology of the cardiovascular system and associated critical values. Provides general exercise guidelines with specific guidelines to those with heart or lung disease.    Education: Flexibility, Balance, Mind/Body Relaxation: Provides group verbal/written instruction on the benefits of flexibility and balance training, including mind/body exercise modes such as yoga, pilates and tai chi.  Demonstration and skill practice provided.   Activity  Barriers & Risk Stratification: Activity Barriers & Cardiac Risk Stratification - 02/28/20 1242      Activity Barriers & Cardiac Risk Stratification   Activity Barriers  Deconditioning;Muscular Weakness;Shortness of Breath    Cardiac Risk Stratification  High       6 Minute Walk: 6 Minute Walk    Row Name 02/28/20 1241         6 Minute Walk   Phase  Initial     Distance  1415 feet     Walk Time  6 minutes     # of Rest Breaks  0     MPH  2.68     METS  4.33     RPE  11     Perceived Dyspnea   2     VO2 Peak  15.14     Symptoms  Yes (comment)     Comments  SOB     Resting HR  89 bpm     Resting BP  136/74     Resting Oxygen Saturation   99 %     Exercise Oxygen Saturation  during 6 min walk  98 %     Max Ex. HR  117 bpm     Max Ex. BP  166/76     2 Minute Post BP  144/74        Oxygen Initial Assessment:   Oxygen Re-Evaluation:   Oxygen Discharge (Final Oxygen Re-Evaluation):   Initial Exercise Prescription: Initial Exercise Prescription - 02/28/20 1200      Date of Initial Exercise RX and Referring Provider   Date  02/28/20    Referring Provider  Ida Rogue MD      Treadmill   MPH  2.6    Grade  1    Minutes  15    METs  3.35      Recumbant Bike   Level  4    RPM  50    Watts  59    Minutes  15    METs  3      NuStep   Level  4    SPM  80    Minutes  15    METs  3      Recumbant Elliptical   Level  2    RPM  50    Minutes  15    METs  3      Prescription Details   Frequency (times per week)  3    Duration  Progress to 30 minutes of continuous aerobic without signs/symptoms of physical distress      Intensity   THRR 40-80% of Max Heartrate  121-183    Ratings of Perceived Exertion  11-13    Perceived Dyspnea  0-4      Progression   Progression  Continue to  progress workloads to maintain intensity without signs/symptoms of physical distress.      Resistance Training   Training Prescription  Yes    Weight  3 lb    Reps   10-15       Perform Capillary Blood Glucose checks as needed.  Exercise Prescription Changes: Exercise Prescription Changes    Row Name 02/28/20 1200 03/06/20 1300           Response to Exercise   Blood Pressure (Admit)  134/74  130/78      Blood Pressure (Exercise)  166/76  162/84      Blood Pressure (Exit)  144/74  124/78      Heart Rate (Admit)  89 bpm  85 bpm      Heart Rate (Exercise)  117 bpm  123 bpm      Heart Rate (Exit)  90 bpm  99 bpm      Oxygen Saturation (Admit)  99 %  -      Oxygen Saturation (Exercise)  98 %  -      Rating of Perceived Exertion (Exercise)  11  15      Perceived Dyspnea (Exercise)  2  -      Symptoms  SOB  -      Comments  walk test results  first day         Exercise Comments: Exercise Comments    Row Name 03/04/20 1148           Exercise Comments  First full day of exercise!  Patient was oriented to gym and equipment including functions, settings, policies, and procedures.  Patient's individual exercise prescription and treatment plan were reviewed.  All starting workloads were established based on the results of the 6 minute walk test done at initial orientation visit.  The plan for exercise progression was also introduced and progression will be customized based on patient's performance and goals.          Exercise Goals and Review: Exercise Goals    Row Name 02/28/20 1244             Exercise Goals   Increase Physical Activity  Yes       Intervention  Provide advice, education, support and counseling about physical activity/exercise needs.;Develop an individualized exercise prescription for aerobic and resistive training based on initial evaluation findings, risk stratification, comorbidities and participant's personal goals.       Expected Outcomes  Short Term: Attend rehab on a regular basis to increase amount of physical activity.;Long Term: Add in home exercise to make exercise part of routine and to increase amount of  physical activity.;Long Term: Exercising regularly at least 3-5 days a week.       Increase Strength and Stamina  Yes       Intervention  Provide advice, education, support and counseling about physical activity/exercise needs.;Develop an individualized exercise prescription for aerobic and resistive training based on initial evaluation findings, risk stratification, comorbidities and participant's personal goals.       Expected Outcomes  Short Term: Increase workloads from initial exercise prescription for resistance, speed, and METs.;Long Term: Improve cardiorespiratory fitness, muscular endurance and strength as measured by increased METs and functional capacity (6MWT);Short Term: Perform resistance training exercises routinely during rehab and add in resistance training at home       Able to understand and use rate of perceived exertion (RPE) scale  Yes       Intervention  Provide education and explanation  on how to use RPE scale       Expected Outcomes  Short Term: Able to use RPE daily in rehab to express subjective intensity level;Long Term:  Able to use RPE to guide intensity level when exercising independently       Able to understand and use Dyspnea scale  Yes       Intervention  Provide education and explanation on how to use Dyspnea scale       Expected Outcomes  Short Term: Able to use Dyspnea scale daily in rehab to express subjective sense of shortness of breath during exertion;Long Term: Able to use Dyspnea scale to guide intensity level when exercising independently       Knowledge and understanding of Target Heart Rate Range (THRR)  Yes       Intervention  Provide education and explanation of THRR including how the numbers were predicted and where they are located for reference       Expected Outcomes  Short Term: Able to state/look up THRR;Short Term: Able to use daily as guideline for intensity in rehab;Long Term: Able to use THRR to govern intensity when exercising independently        Able to check pulse independently  Yes       Intervention  Provide education and demonstration on how to check pulse in carotid and radial arteries.;Review the importance of being able to check your own pulse for safety during independent exercise       Expected Outcomes  Short Term: Able to explain why pulse checking is important during independent exercise;Long Term: Able to check pulse independently and accurately       Understanding of Exercise Prescription  Yes       Intervention  Provide education, explanation, and written materials on patient's individual exercise prescription       Expected Outcomes  Short Term: Able to explain program exercise prescription;Long Term: Able to explain home exercise prescription to exercise independently          Exercise Goals Re-Evaluation : Exercise Goals Re-Evaluation    Row Name 03/04/20 1148             Exercise Goal Re-Evaluation   Exercise Goals Review  Able to understand and use rate of perceived exertion (RPE) scale;Knowledge and understanding of Target Heart Rate Range (THRR);Able to check pulse independently;Understanding of Exercise Prescription       Comments  Reviewed RPE scale, THR and program prescription with pt today.  Pt voiced understanding and was given a copy of goals to take home.       Expected Outcomes  Short: Use RPE daily to regulate intensity. Long: Follow program prescription in THR.          Discharge Exercise Prescription (Final Exercise Prescription Changes): Exercise Prescription Changes - 03/06/20 1300      Response to Exercise   Blood Pressure (Admit)  130/78    Blood Pressure (Exercise)  162/84    Blood Pressure (Exit)  124/78    Heart Rate (Admit)  85 bpm    Heart Rate (Exercise)  123 bpm    Heart Rate (Exit)  99 bpm    Rating of Perceived Exertion (Exercise)  15    Comments  first day       Nutrition:  Target Goals: Understanding of nutrition guidelines, daily intake of sodium '1500mg'$ ,  cholesterol '200mg'$ , calories 30% from fat and 7% or less from saturated fats, daily to have 5 or more servings of  fruits and vegetables.  Education: Controlling Sodium/Reading Food Labels -Group verbal and written material supporting the discussion of sodium use in heart healthy nutrition. Review and explanation with models, verbal and written materials for utilization of the food label.   Education: General Nutrition Guidelines/Fats and Fiber: -Group instruction provided by verbal, written material, models and posters to present the general guidelines for heart healthy nutrition. Gives an explanation and review of dietary fats and fiber.   Biometrics: Pre Biometrics - 02/28/20 1244      Pre Biometrics   Height  5' 3.1" (1.603 m)    Weight  178 lb 4.8 oz (80.9 kg)    BMI (Calculated)  31.47    Single Leg Stand  19.66 seconds        Nutrition Therapy Plan and Nutrition Goals: Nutrition Therapy & Goals - 03/18/20 1201      Nutrition Therapy   Diet  low Na, HH    Drug/Food Interactions  Statins/Certain Fruits    Protein (specify units)  65-70g    Fiber  25 grams    Whole Grain Foods  3 servings    Saturated Fats  12 max. grams    Fruits and Vegetables  5 servings/day    Sodium  1.5 grams      Personal Nutrition Goals   Nutrition Goal  LT: increase stamina on exertion    Comments  B: oatmeal L: varies D: pt reports not eating a lot; eating fried foods, not many vegetables. Motivation. Discussed HH eating, discussed planning and shopping on a budget. Pt would like to eat consistently, talked about healthy things to eat on hand; pt reports making bean salad and pasta salad.      Intervention Plan   Intervention  Prescribe, educate and counsel regarding individualized specific dietary modifications aiming towards targeted core components such as weight, hypertension, lipid management, diabetes, heart failure and other comorbidities.;Nutrition handout(s) given to patient.    Expected  Outcomes  Short Term Goal: Understand basic principles of dietary content, such as calories, fat, sodium, cholesterol and nutrients.;Short Term Goal: A plan has been developed with personal nutrition goals set during dietitian appointment.;Long Term Goal: Adherence to prescribed nutrition plan.       Nutrition Assessments: Nutrition Assessments - 02/28/20 1329      MEDFICTS Scores   Pre Score  25       MEDIFICTS Score Key:          ?70 Need to make dietary changes          40-70 Heart Healthy Diet         ? 40 Therapeutic Level Cholesterol Diet  Nutrition Goals Re-Evaluation:   Nutrition Goals Discharge (Final Nutrition Goals Re-Evaluation):   Psychosocial: Target Goals: Acknowledge presence or absence of significant depression and/or stress, maximize coping skills, provide positive support system. Participant is able to verbalize types and ability to use techniques and skills needed for reducing stress and depression.   Education: Depression - Provides group verbal and written instruction on the correlation between heart/lung disease and depressed mood, treatment options, and the stigmas associated with seeking treatment.   Education: Sleep Hygiene -Provides group verbal and written instruction about how sleep can affect your health.  Define sleep hygiene, discuss sleep cycles and impact of sleep habits. Review good sleep hygiene tips.     Education: Stress and Anxiety: - Provides group verbal and written instruction about the health risks of elevated stress and causes of high stress.  Discuss  the correlation between heart/lung disease and anxiety and treatment options. Review healthy ways to manage with stress and anxiety.    Initial Review & Psychosocial Screening: Initial Psych Review & Screening - 02/26/20 1255      Initial Review   Current issues with  None Identified      Family Dynamics   Good Support System?  Yes    Comments  She can look to her husband, son,  brother, sisterin law, sister and co workers for support. She is being positive about improving her health and moving on with her life.      Barriers   Psychosocial barriers to participate in program  There are no identifiable barriers or psychosocial needs.;The patient should benefit from training in stress management and relaxation.      Screening Interventions   Interventions  Encouraged to exercise;Provide feedback about the scores to participant;To provide support and resources with identified psychosocial needs    Expected Outcomes  Short Term goal: Utilizing psychosocial counselor, staff and physician to assist with identification of specific Stressors or current issues interfering with healing process. Setting desired goal for each stressor or current issue identified.;Long Term Goal: Stressors or current issues are controlled or eliminated.;Short Term goal: Identification and review with participant of any Quality of Life or Depression concerns found by scoring the questionnaire.;Long Term goal: The participant improves quality of Life and PHQ9 Scores as seen by post scores and/or verbalization of changes       Quality of Life Scores:  Quality of Life - 02/28/20 1328      Quality of Life   Select  Quality of Life      Quality of Life Scores   Health/Function Pre  26.13 %    Socioeconomic Pre  30 %    Psych/Spiritual Pre  27.21 %    Family Pre  28.8 %    GLOBAL Pre  27.54 %      Scores of 19 and below usually indicate a poorer quality of life in these areas.  A difference of  2-3 points is a clinically meaningful difference.  A difference of 2-3 points in the total score of the Quality of Life Index has been associated with significant improvement in overall quality of life, self-image, physical symptoms, and general health in studies assessing change in quality of life.  PHQ-9: Recent Review Flowsheet Data    Depression screen [facility] 2/9 02/28/2020   Decreased Interest 0   Down,  Depressed, Hopeless 0   PHQ - 2 Score 0   Altered sleeping 0   Tired, decreased energy 1   Change in appetite 0   Feeling bad or failure about yourself  0   Trouble concentrating 0   Moving slowly or fidgety/restless 0   Suicidal thoughts 0   PHQ-9 Score 1   Difficult doing work/chores Not difficult at all     Interpretation of Total Score  Total Score Depression Severity:  1-4 = Minimal depression, 5-9 = Mild depression, 10-14 = Moderate depression, 15-19 = Moderately severe depression, 20-27 = Severe depression   Psychosocial Evaluation and Intervention: Psychosocial Evaluation - 02/26/20 1259      Psychosocial Evaluation & Interventions   Interventions  Encouraged to exercise with the program and follow exercise prescription    Comments  She can look to her husband, son, brother, sisterin law, sister and co workers for support. She is being positive about improving her health and moving on with her life.  Expected Outcomes  Short: Attend HeartTrack stress management education to decrease stress. Long: Maintain exercise Post HeartTrack to keep stress at a minimum.    Continue Psychosocial Services   Follow up required by staff       Psychosocial Re-Evaluation:   Psychosocial Discharge (Final Psychosocial Re-Evaluation):   Vocational Rehabilitation: Provide vocational rehab assistance to qualifying candidates.   Vocational Rehab Evaluation & Intervention:   Education: Education Goals: Education classes will be provided on a variety of topics geared toward better understanding of heart health and risk factor modification. Participant will state understanding/return demonstration of topics presented as noted by education test scores.  Learning Barriers/Preferences: Learning Barriers/Preferences - 02/26/20 1300      Learning Barriers/Preferences   Learning Barriers  Sight   wears glasses   Learning Preferences  None       General Cardiac Education  Topics:  AED/CPR: - Group verbal and written instruction with the use of models to demonstrate the basic use of the AED with the basic ABC's of resuscitation.   Anatomy & Physiology of the Heart: - Group verbal and written instruction and models provide basic cardiac anatomy and physiology, with the coronary electrical and arterial systems. Review of Valvular disease and Heart Failure   Cardiac Procedures: - Group verbal and written instruction to review commonly prescribed medications for heart disease. Reviews the medication, class of the drug, and side effects. Includes the steps to properly store meds and maintain the prescription regimen. (beta blockers and nitrates)   Cardiac Medications I: - Group verbal and written instruction to review commonly prescribed medications for heart disease. Reviews the medication, class of the drug, and side effects. Includes the steps to properly store meds and maintain the prescription regimen.   Cardiac Medications II: -Group verbal and written instruction to review commonly prescribed medications for heart disease. Reviews the medication, class of the drug, and side effects. (all other drug classes)    Go Sex-Intimacy & Heart Disease, Get SMART - Goal Setting: - Group verbal and written instruction through game format to discuss heart disease and the return to sexual intimacy. Provides group verbal and written material to discuss and apply goal setting through the application of the S.M.A.R.T. Method.   Other Matters of the Heart: - Provides group verbal, written materials and models to describe Stable Angina and Peripheral Artery. Includes description of the disease process and treatment options available to the cardiac patient.   Infection Prevention: - Provides verbal and written material to individual with discussion of infection control including proper hand washing and proper equipment cleaning during exercise session.   Cardiac Rehab  from 02/26/2020 in [facility] Cardiac and Pulmonary Rehab  Date  02/26/20  Educator  [facility]  Instruction Review Code  1- Verbalizes Understanding      Falls Prevention: - Provides verbal and written material to individual with discussion of falls prevention and safety.   Cardiac Rehab from 02/26/2020 in [facility] Cardiac and Pulmonary Rehab  Date  02/26/20  Educator  [facility]  Instruction Review Code  1- Verbalizes Understanding      Other: -Provides group and verbal instruction on various topics (see comments)   Knowledge Questionnaire Score: Knowledge Questionnaire Score - 02/28/20 1329      Knowledge Questionnaire Score   Pre Score  24/26 Education Focus: Angina, Exercise       Core Components/Risk Factors/Patient Goals at Admission: Personal Goals and Risk Factors at Admission - 02/28/20 1245      Core Components/Risk Factors/Patient  Goals on Admission    Weight Management  Yes;Weight Loss;Obesity    Intervention  Weight Management: Develop a combined nutrition and exercise program designed to reach desired caloric intake, while maintaining appropriate intake of nutrient and fiber, sodium and fats, and appropriate energy expenditure required for the weight goal.;Weight Management: Provide education and appropriate resources to help participant work on and attain dietary goals.;Weight Management/Obesity: Establish reasonable short term and long term weight goals.;Obesity: Provide education and appropriate resources to help participant work on and attain dietary goals.    Admit Weight  178 lb 4.8 oz (80.9 kg)    Goal Weight: Short Term  173 lb (78.5 kg)    Goal Weight: Long Term  168 lb (76.2 kg)    Expected Outcomes  Short Term: Continue to assess and modify interventions until short term weight is achieved;Long Term: Adherence to nutrition and physical activity/exercise program aimed toward attainment of established weight goal;Weight Loss: Understanding of general recommendations for a balanced  deficit meal plan, which promotes 1-2 lb weight loss per week and includes a negative energy balance of [phone] kcal/d;Understanding recommendations for meals to include 15-35% energy as protein, 25-35% energy from fat, 35-60% energy from carbohydrates, less than '200mg'$  of dietary cholesterol, 20-35 gm of total fiber daily;Understanding of distribution of calorie intake throughout the day with the consumption of 4-5 meals/snacks    Tobacco Cessation  Yes    Number of packs per day  2 cigs per day    Intervention  Assist the participant in steps to quit. Provide individualized education and counseling about committing to Tobacco Cessation, relapse prevention, and pharmacological support that can be provided by physician.;Advice worker, assist with locating and accessing local/national Quit Smoking programs, and support quit date choice.    Expected Outcomes  Short Term: Will demonstrate readiness to quit, by selecting a quit date.;Short Term: Will quit all tobacco product use, adhering to prevention of relapse plan.;Long Term: Complete abstinence from all tobacco products for at least 12 months from quit date.    Hypertension  Yes    Intervention  Provide education on lifestyle modifcations including regular physical activity/exercise, weight management, moderate sodium restriction and increased consumption of fresh fruit, vegetables, and low fat dairy, alcohol moderation, and smoking cessation.;Monitor prescription use compliance.    Expected Outcomes  Short Term: Continued assessment and intervention until BP is < 140/61m HG in hypertensive participants. < 130/846mHG in hypertensive participants with diabetes, heart failure or chronic kidney disease.;Long Term: Maintenance of blood pressure at goal levels.    Lipids  Yes    Intervention  Provide education and support for participant on nutrition & aerobic/resistive exercise along with prescribed medications to achieve LDL '70mg'$ , HDL  >'40mg'$ .    Expected Outcomes  Long Term: Cholesterol controlled with medications as prescribed, with individualized exercise RX and with personalized nutrition plan. Value goals: LDL < '70mg'$ , HDL > 40 mg.;Short Term: Participant states understanding of desired cholesterol values and is compliant with medications prescribed. Participant is following exercise prescription and nutrition guidelines.       Education:Diabetes - Individual verbal and written instruction to review signs/symptoms of diabetes, desired ranges of glucose level fasting, after meals and with exercise. Acknowledge that pre and post exercise glucose checks will be done for 3 sessions at entry of program.   Education: Know Your Numbers and Risk Factors: -Group verbal and written instruction about important numbers in your health.  Discussion of what are risk factors and how they play  a role in the disease process.  Review of Cholesterol, Blood Pressure, Diabetes, and BMI and the role they play in your overall health.   Core Components/Risk Factors/Patient Goals Review:    Core Components/Risk Factors/Patient Goals at Discharge (Final Review):    ITP Comments: ITP Comments    Row Name 02/26/20 1305 02/28/20 1241 03/04/20 1148 03/18/20 1223 03/19/20 0520   ITP Comments  Virtual Orientation performed. Patient informed when to come in for RD and EP orientation. Diagnosis can be found in [facility] 01/16/2020.  Completed 6MWT and gym orientation.  Initial ITP created and sent for review to Dr. Caryl Comes covering for Dr. Emily Filbert, Medical Director.  First full day of exercise!  Patient was oriented to gym and equipment including functions, settings, policies, and procedures.  Patient's individual exercise prescription and treatment plan were reviewed.  All starting workloads were established based on the results of the 6 minute walk test done at initial orientation visit.  The plan for exercise progression was also introduced and progression  will be customized based on patient's performance and goals.  Completed Initial RD Eval  30 Day review completed. Medical Director review done, changes made as directed,and approval shown by signature of Market researcher.      Comments:

## 2020-03-21 ENCOUNTER — Other Ambulatory Visit: Payer: Self-pay

## 2020-03-21 ENCOUNTER — Encounter: Payer: BC Managed Care – PPO | Admitting: *Deleted

## 2020-03-21 DIAGNOSIS — Z951 Presence of aortocoronary bypass graft: Secondary | ICD-10-CM

## 2020-03-21 DIAGNOSIS — I429 Cardiomyopathy, unspecified: Secondary | ICD-10-CM | POA: Diagnosis not present

## 2020-03-21 DIAGNOSIS — F17210 Nicotine dependence, cigarettes, uncomplicated: Secondary | ICD-10-CM | POA: Diagnosis not present

## 2020-03-21 DIAGNOSIS — Z79899 Other long term (current) drug therapy: Secondary | ICD-10-CM | POA: Diagnosis not present

## 2020-03-21 DIAGNOSIS — I10 Essential (primary) hypertension: Secondary | ICD-10-CM | POA: Diagnosis not present

## 2020-03-21 DIAGNOSIS — Z7982 Long term (current) use of aspirin: Secondary | ICD-10-CM | POA: Diagnosis not present

## 2020-03-21 NOTE — Progress Notes (Signed)
Daily Session Note  Patient Details  Name: Victoria Avery MRN: [id number] Date of Birth: 24-Jun-1966 Referring Provider:     Cardiac Rehab from 02/28/2020 in [facility] Cardiac and Pulmonary Rehab  Referring Provider  Ida Rogue MD      Encounter Date: 03/21/2020  Check In: Session Check In - 03/21/20 1149      Check-In   Supervising physician immediately available to respond to emergencies  See telemetry face sheet for immediately available ER MD    Location  [facility]-Cardiac & Pulmonary Rehab    Staff Present  Renita Papa, RN BSN;Jessica Luan Pulling, MA, RCEP, CCRP, CCET;Joseph Hood RCP,RRT,BSRT;Melissa Mankato RDN, LDN   Basilia Jumbo RN   Virtual Visit  No    Medication changes reported      No    Fall or balance concerns reported     No    Warm-up and Cool-down  Performed on first and last piece of equipment    Resistance Training Performed  Yes    VAD Patient?  No    PAD/SET Patient?  No      Pain Assessment   Currently in Pain?  No/denies          Social History   Tobacco Use  Smoking Status Current Every Day Smoker  . Packs/day: 0.25  . Years: 30.00  . Pack years: 7.50  . Types: Cigarettes  Smokeless Tobacco Never Used    Goals Met:  Independence with exercise equipment Exercise tolerated well No report of cardiac concerns or symptoms  Goals Unmet:  Not Applicable  Comments: Pt able to follow exercise prescription today without complaint.  Will continue to monitor for progression.  Reviewed home exercise with pt today.  Pt plans to walk and use staff videos at home for exercise.  Reviewed THR, pulse, RPE, sign and symptoms, NTG use, and when to call 911 or MD.  Also discussed weather considerations and indoor options.  Pt voiced understanding.   Dr. Emily Filbert is Medical Director for [place] and [name].

## 2020-03-24 ENCOUNTER — Telehealth: Payer: Self-pay | Admitting: Cardiovascular Disease

## 2020-03-24 ENCOUNTER — Other Ambulatory Visit: Payer: Self-pay

## 2020-03-24 ENCOUNTER — Encounter: Payer: BC Managed Care – PPO | Admitting: *Deleted

## 2020-03-24 DIAGNOSIS — I10 Essential (primary) hypertension: Secondary | ICD-10-CM | POA: Diagnosis not present

## 2020-03-24 DIAGNOSIS — Z7982 Long term (current) use of aspirin: Secondary | ICD-10-CM | POA: Diagnosis not present

## 2020-03-24 DIAGNOSIS — Z951 Presence of aortocoronary bypass graft: Secondary | ICD-10-CM | POA: Diagnosis not present

## 2020-03-24 DIAGNOSIS — F17210 Nicotine dependence, cigarettes, uncomplicated: Secondary | ICD-10-CM | POA: Diagnosis not present

## 2020-03-24 DIAGNOSIS — Z79899 Other long term (current) drug therapy: Secondary | ICD-10-CM | POA: Diagnosis not present

## 2020-03-24 DIAGNOSIS — I429 Cardiomyopathy, unspecified: Secondary | ICD-10-CM | POA: Diagnosis not present

## 2020-03-24 NOTE — Progress Notes (Signed)
Daily Session Note  Patient Details  Name: Victoria Avery MRN: [id number] Date of Birth: 28-Jan-1966 Referring Provider:     Cardiac Rehab from 02/28/2020 in [facility] Cardiac and Pulmonary Rehab  Referring Provider  Ida Rogue MD      Encounter Date: 03/24/2020  Check In: Session Check In - 03/24/20 1130      Check-In   Supervising physician immediately available to respond to emergencies  See telemetry face sheet for immediately available ER MD    Location  [facility]-Cardiac & Pulmonary Rehab    Staff Present  Renita Papa, RN Moises Blood, BS, ACSM CEP, Exercise Physiologist;Amanda Oletta Darter, [place], ACSM CEP, Exercise Physiologist    Virtual Visit  No    Medication changes reported      No    Fall or balance concerns reported     No    Warm-up and Cool-down  Performed on first and last piece of equipment    Resistance Training Performed  Yes    VAD Patient?  No    PAD/SET Patient?  No      Pain Assessment   Currently in Pain?  No/denies          Social History   Tobacco Use  Smoking Status Current Every Day Smoker  . Packs/day: 0.25  . Years: 30.00  . Pack years: 7.50  . Types: Cigarettes  Smokeless Tobacco Never Used    Goals Met:  Independence with exercise equipment Exercise tolerated well No report of cardiac concerns or symptoms Strength training completed today  Goals Unmet:  Not Applicable  Comments: Pt able to follow exercise prescription today without complaint.  Will continue to monitor for progression.    Dr. Emily Filbert is Medical Director for [place] and [name].

## 2020-03-24 NOTE — Telephone Encounter (Signed)
Patient has upcoming appt with G[facility] on 5/10.  She wants to know if this is an appt to establish clearance to return to work . Please call to discuss as details are not clear .

## 2020-03-24 NOTE — Telephone Encounter (Signed)
Spoke with patient and reviewed that this appointment is for her routine visit to follow up post procedure. She was to go back to work the day after and she wanted to know if this visit was for clearance. Advised that her surgeon would be the one to release her from a surgical standpoint but Dr. Mariah Milling may also want to just check in as well. She is signed up for cardiac rehab and wanted to know about this as well. Advised that since she can take off that one day before her appointment then she can discuss with provider about her ability to return to work. She verbalized understanding with no further questions at this time.

## 2020-03-26 ENCOUNTER — Encounter: Payer: BC Managed Care – PPO | Admitting: *Deleted

## 2020-03-26 ENCOUNTER — Other Ambulatory Visit: Payer: Self-pay

## 2020-03-26 DIAGNOSIS — I429 Cardiomyopathy, unspecified: Secondary | ICD-10-CM | POA: Diagnosis not present

## 2020-03-26 DIAGNOSIS — Z7982 Long term (current) use of aspirin: Secondary | ICD-10-CM | POA: Diagnosis not present

## 2020-03-26 DIAGNOSIS — I10 Essential (primary) hypertension: Secondary | ICD-10-CM | POA: Diagnosis not present

## 2020-03-26 DIAGNOSIS — Z79899 Other long term (current) drug therapy: Secondary | ICD-10-CM | POA: Diagnosis not present

## 2020-03-26 DIAGNOSIS — Z951 Presence of aortocoronary bypass graft: Secondary | ICD-10-CM | POA: Diagnosis not present

## 2020-03-26 DIAGNOSIS — F17210 Nicotine dependence, cigarettes, uncomplicated: Secondary | ICD-10-CM | POA: Diagnosis not present

## 2020-03-26 NOTE — Progress Notes (Signed)
Daily Session Note  Patient Details  Name: LAURELLE SKIVER MRN: [id number] Date of Birth: 29-Aug-1966 Referring Provider:     Cardiac Rehab from 02/28/2020 in [facility] Cardiac and Pulmonary Rehab  Referring Provider  Ida Rogue MD      Encounter Date: 03/26/2020  Check In: Session Check In - 03/26/20 1137      Check-In   Supervising physician immediately available to respond to emergencies  See telemetry face sheet for immediately available ER MD    Location  [facility]-Cardiac & Pulmonary Rehab    Staff Present  Renita Papa, RN BSN;Joseph [address] [place], [place], ACSM CEP, Exercise Physiologist    Virtual Visit  No    Medication changes reported      No    Fall or balance concerns reported     No    Tobacco Cessation  Use Increase    Warm-up and Cool-down  Performed on first and last piece of equipment    Resistance Training Performed  Yes    VAD Patient?  No      Pain Assessment   Currently in Pain?  No/denies          Social History   Tobacco Use  Smoking Status Current Every Day Smoker  . Packs/day: 0.25  . Years: 30.00  . Pack years: 7.50  . Types: Cigarettes  Smokeless Tobacco Never Used    Goals Met:  Independence with exercise equipment Exercise tolerated well No report of cardiac concerns or symptoms Strength training completed today  Goals Unmet:  Not Applicable  Comments: Pt able to follow exercise prescription today without complaint.  Will continue to monitor for progression.    Dr. Emily Filbert is Medical Director for [place] and [name].

## 2020-03-28 ENCOUNTER — Other Ambulatory Visit: Payer: Self-pay

## 2020-03-28 DIAGNOSIS — Z79899 Other long term (current) drug therapy: Secondary | ICD-10-CM | POA: Diagnosis not present

## 2020-03-28 DIAGNOSIS — Z951 Presence of aortocoronary bypass graft: Secondary | ICD-10-CM | POA: Diagnosis not present

## 2020-03-28 DIAGNOSIS — I429 Cardiomyopathy, unspecified: Secondary | ICD-10-CM | POA: Diagnosis not present

## 2020-03-28 DIAGNOSIS — I10 Essential (primary) hypertension: Secondary | ICD-10-CM | POA: Diagnosis not present

## 2020-03-28 DIAGNOSIS — F17210 Nicotine dependence, cigarettes, uncomplicated: Secondary | ICD-10-CM | POA: Diagnosis not present

## 2020-03-28 DIAGNOSIS — Z7982 Long term (current) use of aspirin: Secondary | ICD-10-CM | POA: Diagnosis not present

## 2020-03-28 NOTE — Progress Notes (Signed)
Daily Session Note  Patient Details  Name: Victoria Avery MRN: [id number] Date of Birth: 1966-02-27 Referring Provider:     Cardiac Rehab from 02/28/2020 in [facility] Cardiac and Pulmonary Rehab  Referring Provider  Ida Rogue MD      Encounter Date: 03/28/2020  Check In:      Social History   Tobacco Use  Smoking Status Current Every Day Smoker  . Packs/day: 0.25  . Years: 30.00  . Pack years: 7.50  . Types: Cigarettes  Smokeless Tobacco Never Used    Goals Met:  Proper associated with RPD/PD & O2 Sat Independence with exercise equipment Exercise tolerated well No report of cardiac concerns or symptoms Strength training completed today  Goals Unmet:  Not Applicable  Comments: Pt able to follow exercise prescription today without complaint.  Will continue to monitor for progression.   Dr. Emily Filbert is Medical Director for [name] and [name].

## 2020-03-31 ENCOUNTER — Encounter: Payer: BC Managed Care – PPO | Attending: Cardiovascular Disease | Admitting: *Deleted

## 2020-03-31 ENCOUNTER — Other Ambulatory Visit: Payer: Self-pay

## 2020-03-31 DIAGNOSIS — I252 Old myocardial infarction: Secondary | ICD-10-CM | POA: Diagnosis not present

## 2020-03-31 DIAGNOSIS — Z951 Presence of aortocoronary bypass graft: Secondary | ICD-10-CM | POA: Diagnosis not present

## 2020-03-31 NOTE — Progress Notes (Signed)
Daily Session Note  Patient Details  Name: Victoria Avery MRN: [id number] Date of Birth: 1966/02/03 Referring Provider:     Cardiac Rehab from 02/28/2020 in [facility] Cardiac and Pulmonary Rehab  Referring Provider  Ida Rogue MD      Encounter Date: 03/31/2020  Check In: Session Check In - 03/31/20 1111      Check-In   Supervising physician immediately available to respond to emergencies  See telemetry face sheet for immediately available ER MD    Location  [facility]-Cardiac & Pulmonary Rehab    Staff Present  Renita Papa, RN Vickki Hearing, BA, ACSM CEP, Exercise Physiologist;Kelly Amedeo Plenty, BS, ACSM CEP, Exercise Physiologist    Virtual Visit  No    Medication changes reported      No    Fall or balance concerns reported     No    Tobacco Cessation  No Change    Current number of cigarettes/nicotine per day      7    Warm-up and Cool-down  Performed on first and last piece of equipment    Resistance Training Performed  Yes    VAD Patient?  No    PAD/SET Patient?  No      Pain Assessment   Currently in Pain?  No/denies          Social History   Tobacco Use  Smoking Status Current Every Day Smoker  . Packs/day: 0.25  . Years: 30.00  . Pack years: 7.50  . Types: Cigarettes  Smokeless Tobacco Never Used    Goals Met:  Independence with exercise equipment Exercise tolerated well No report of cardiac concerns or symptoms Strength training completed today  Goals Unmet:  Not Applicable  Comments: Pt able to follow exercise prescription today without complaint.  Will continue to monitor for progression.    Dr. Emily Filbert is Medical Director for [place] and [name].

## 2020-04-02 ENCOUNTER — Other Ambulatory Visit: Payer: Self-pay

## 2020-04-02 ENCOUNTER — Encounter: Payer: BC Managed Care – PPO | Admitting: *Deleted

## 2020-04-02 DIAGNOSIS — Z951 Presence of aortocoronary bypass graft: Secondary | ICD-10-CM

## 2020-04-02 DIAGNOSIS — I252 Old myocardial infarction: Secondary | ICD-10-CM | POA: Diagnosis not present

## 2020-04-02 NOTE — Progress Notes (Signed)
Daily Session Note  Patient Details  Name: Victoria Avery MRN: [id number] Date of Birth: 1966-05-06 Referring Provider:     Cardiac Rehab from 02/28/2020 in [facility] Cardiac and Pulmonary Rehab  Referring Provider  Ida Rogue MD      Encounter Date: 04/02/2020  Check In: Session Check In - 04/02/20 1156      Check-In   Supervising physician immediately available to respond to emergencies  See telemetry face sheet for immediately available ER MD    Location  [facility]-Cardiac & Pulmonary Rehab    Staff Present  Alberteen Sam, MA, RCEP, CCRP, CCET;Amanda Sommer, BA, ACSM CEP, Exercise Physiologist;Joseph Hood RCP,RRT,BSRT   Basilia Jumbo RN, BSN   Virtual Visit  No    Medication changes reported      No    Fall or balance concerns reported     No    Warm-up and Cool-down  Performed on first and last piece of equipment    Resistance Training Performed  Yes    VAD Patient?  No    PAD/SET Patient?  No      Pain Assessment   Currently in Pain?  No/denies          Social History   Tobacco Use  Smoking Status Current Every Day Smoker  . Packs/day: 0.25  . Years: 30.00  . Pack years: 7.50  . Types: Cigarettes  Smokeless Tobacco Never Used    Goals Met:  Independence with exercise equipment Exercise tolerated well No report of cardiac concerns or symptoms  Goals Unmet:  Not Applicable  Comments: Pt able to follow exercise prescription today without complaint.  Will continue to monitor for progression.    Dr. Emily Filbert is Medical Director for [place] and [name].

## 2020-04-04 ENCOUNTER — Encounter: Payer: BC Managed Care – PPO | Admitting: *Deleted

## 2020-04-04 ENCOUNTER — Other Ambulatory Visit: Payer: Self-pay

## 2020-04-04 DIAGNOSIS — Z951 Presence of aortocoronary bypass graft: Secondary | ICD-10-CM | POA: Diagnosis not present

## 2020-04-04 DIAGNOSIS — I252 Old myocardial infarction: Secondary | ICD-10-CM | POA: Diagnosis not present

## 2020-04-04 NOTE — Progress Notes (Signed)
Daily Session Note  Patient Details  Name: Victoria Avery MRN: [id number] Date of Birth: June 09, 1966 Referring Provider:     Cardiac Rehab from 02/28/2020 in [facility] Cardiac and Pulmonary Rehab  Referring Provider  Victoria Rogue MD      Encounter Date: 04/04/2020  Check In: Session Check In - 04/04/20 1127      Check-In   Supervising physician immediately available to respond to emergencies  See telemetry face sheet for immediately available ER MD    Location  [facility]-Cardiac & Pulmonary Rehab    Staff Present  Renita Papa, RN BSN;Joseph [address] [name], [place], RCEP, CCRP, CCET    Virtual Visit  No    Medication changes reported      No    Fall or balance concerns reported     No    Tobacco Cessation  No Change    Warm-up and Cool-down  Performed on first and last piece of equipment    Resistance Training Performed  Yes    VAD Patient?  No    PAD/SET Patient?  No      Pain Assessment   Currently in Pain?  No/denies          Social History   Tobacco Use  Smoking Status Current Every Day Smoker  . Packs/day: 0.25  . Years: 30.00  . Pack years: 7.50  . Types: Cigarettes  Smokeless Tobacco Never Used    Goals Met:  Independence with exercise equipment Exercise tolerated well No report of cardiac concerns or symptoms Strength training completed today  Goals Unmet:  Not Applicable  Comments: Pt able to follow exercise prescription today without complaint.  Will continue to monitor for progression.    Dr. Emily Filbert is Medical Director for [place] and [name].

## 2020-04-06 DIAGNOSIS — I2510 Atherosclerotic heart disease of native coronary artery without angina pectoris: Secondary | ICD-10-CM | POA: Insufficient documentation

## 2020-04-06 NOTE — Progress Notes (Signed)
Cardiology Office Note  Date:  04/07/2020   ID:  Victoria Avery, DOB 09/20/1966, MRN [id number]  PCP:  Patient, No Pcp Per   Chief Complaint  Patient presents with  . other    2 month follow up. Meds reviewed by the pt. verbally. "doing well."     HPI:  Victoria Avery is a 54 y.o. female with a hx  Of malignant hypertension Of smoking, who continues to smoke 1 pack/day CAD, CABG x3, Who presents for f/u of her CAD   [facility] 01/13/2020 reporting chest pain.   ED EKG showed ST dependent depression with positive troponin.  Ruled in for NSTEMI.    Cardiac cath 01/14/2020 with mildly reduced LVEF and multivessel CAD.   Transferred to [facility] for CABG.   Underwent CABG with LIMA to LAD, R SVG to diagonal 1, SVG to PDA.  discharged 01/21/2020.  Cardiac cath 01/14/20  There is mild left ventricular systolic dysfunction.  LV end diastolic pressure is normal.  The left ventricular ejection fraction is 45-50% by visual estimate.  Prox RCA lesion is 90% stenosed.  Prox RCA to Mid RCA lesion is 100% stenosed.  Mid Cx to Dist Cx lesion is 99% stenosed.  Mid LAD-1 lesion is 90% stenosed.  Mid LAD-2 lesion is 60% stenosed.  1st Diag-1 lesion is 85% stenosed.  1st Diag-2 lesion is 70% stenosed.  1. Severe three-vessel coronary artery disease with chronic occlusion of the RCA with left-to-right collaterals, subtotal occlusion of the distal left circumflex supplying small OM branches and significant mid LAD stenosis and significant disease in ostial large first diagonal. OM branches are likely too small to graft. 2. Mildly reduced LV systolic function with an EF of 45 to 50% with inferior wall hypokinesis. 3. Normal left ventricular end-diastolic pressure.  Echo 01/14/20 1. Left ventricular ejection fraction, by estimation, is 50 to 55%. The  left ventricle has low normal function. LV endocardial border not  optimally defined to evaluate regional wall motion. Left  ventricular  diastolic parameters were normal.  2. Right ventricular systolic function is normal. The right ventricular  size is normal. Tricuspid regurgitation signal is inadequate for assessing  PA pressure.   Smoking 1/2 ppd, SOB With exertion  Doing cardiac rehab, started 02/26/20  Does not want more pills  EKG personally reviewed by myself on todays visit Shows normal sinus rhythm rate 100 bpm nonspecific T wave abnormality precordial leads oliguric  Echo2/15/2021 Left ventricular ejection fraction, by estimation, is 50 to 55%. The  left ventricle has low normal function. LV endocardial border not  optimally defined to evaluate regional wall motion. Left ventricular  diastolic parameters were normal.    PMH:   has a past medical history of Cardiomyopathy (HCC), Hypertension, and Tobacco abuse.  PSH:    Past Surgical History:  Procedure Laterality Date  . CESAREAN SECTION    . CORONARY ARTERY BYPASS GRAFT N/A 01/18/2020   Procedure: OFF PUMP CORONARY ARTERY BYPASS GRAFTING (CABG) times three using left internal mammary artery and right greater saphenous vein harvested endoscopically.;  Surgeon: Corliss Skains, MD;  Location: [facility] OR;  Service: Open Heart Surgery;  Laterality: N/A;  . LEFT HEART CATH AND CORONARY ANGIOGRAPHY N/A 01/14/2020   Procedure: LEFT HEART CATH AND CORONARY ANGIOGRAPHY;  Surgeon: Iran Ouch, MD;  Location: [facility] INVASIVE CV LAB;  Service: Cardiovascular;  Laterality: N/A;  . TEE WITHOUT CARDIOVERSION N/A 01/18/2020   Procedure: TRANSESOPHAGEAL ECHOCARDIOGRAM (TEE);  Surgeon: Corliss Skains, MD;  Location: [facility] OR;  Service: Open Heart Surgery;  Laterality: N/A;    Current Outpatient Medications  Medication Sig Dispense Refill  . acetaminophen (TYLENOL) 500 MG tablet Take 2 tablets (1,000 mg total) by mouth every 6 (six) hours as needed. 30 tablet 0  . aspirin EC 325 MG EC tablet Take 1 tablet (325 mg total) by mouth daily. 30 tablet 0  .  atorvastatin (LIPITOR) 80 MG tablet Take 1 tablet (80 mg total) by mouth daily at 6 PM. 90 tablet 1  . metoprolol succinate (TOPROL-XL) 50 MG 24 hr tablet Take 1 tablet (50 mg total) by mouth daily. Take with or immediately following a meal. 90 tablet 3  . nitroGLYCERIN (NITROSTAT) 0.4 MG SL tablet Place 1 tablet (0.4 mg total) under the tongue every 5 (five) minutes as needed for chest pain. 25 tablet 3   No current facility-administered medications for this visit.     Allergies:   Patient has no known allergies.   Social History:  The patient  reports that she has been smoking cigarettes. She has a 7.50 pack-year smoking history. She has never used smokeless tobacco. She reports current alcohol use. She reports that she does not use drugs.   Family History:   family history includes CAD in her mother; Hypertension in her mother.    Review of Systems: Review of Systems  Constitutional: Negative.   Respiratory: Negative.   Cardiovascular: Negative.   Gastrointestinal: Negative.   Musculoskeletal: Negative.   Neurological: Negative.   Psychiatric/Behavioral: Negative.   All other systems reviewed and are negative.   PHYSICAL EXAM: VS:  BP (!) 160/100 (BP Location: Left Arm, Patient Position: Sitting, Cuff Size: Normal)   Pulse (!) 101   Ht 5\' 3"  (1.6 m)   Wt 173 lb 8 oz (78.7 kg)   LMP 11/25/2017   SpO2 95%   BMI 30.73 kg/m  , BMI Body mass index is 30.73 kg/m. Constitutional:  oriented to person, place, and time. No distress.  HENT:  Head: Grossly normal Eyes:  no discharge. No scleral icterus.  Neck: No JVD, no carotid bruits  Cardiovascular: Regular rate and rhythm, no murmurs appreciated Pulmonary/Chest: Clear to auscultation bilaterally, no wheezes or rails Abdominal: Soft.  no distension.  no tenderness.  Musculoskeletal: Normal range of motion Neurological:  normal muscle tone. Coordination normal. No atrophy Skin: Skin warm and dry Psychiatric: normal affect,  pleasant  Recent Labs: 01/19/2020: Magnesium 1.6 01/21/2020: BUN 7; Creatinine, Ser 0.92; Hemoglobin 9.9; Platelets 201; Potassium 4.5; Sodium 136    Lipid Panel Lab Results  Component Value Date   CHOL 197 01/14/2020   HDL 38 (L) 01/14/2020   LDLCALC 112 (H) 01/14/2020   TRIG 234 (H) 01/14/2020      Wt Readings from Last 3 Encounters:  04/07/20 173 lb 8 oz (78.7 kg)  02/28/20 178 lb 4.8 oz (80.9 kg)  02/22/20 176 lb (79.8 kg)       ASSESSMENT AND PLAN:  CAD with stable angina S/p CABG Details discussed with her, she has participated in cardiac rehab Unfortunately still smoking 1/2 pack/day -Tolerating Lipitor -Reports blood pressure well controlled, looked at sampling of her blood pressure from cardiac rehab she had taken a picture 120 to 140 systolic seen Discuss starting ACE or ARB, she prefers to hold off, will send 02/24/20 some blood pressure measurements --We will give release to go back to work  Smoker Prescription for Chantix provided Discussed other modalities for smoking cessation Mixed hyperlipidemia She  has declined cholesterol medication  Hyperlipidemia On Lipitor, lipids ordered today Goal LDL less than 70  Smoker Long discussion with her, Chantix prescription provided, discussed other modalities  Essential hypertension Elevated today did not take her blood pressure medication, metoprolol Review of her numbers, might need ACE or ARB She does not want to start one at this time prefers to call us with more numbers  Shortness of breath With some exertion, likely from smoking, deconditioning Recommend she quit smoking, continue to work with cardiac rehab If symptoms get worse might need additional work-up Also recommend she check blood pressure   Disposition:   F/U  6 months  Long discussion of recent events, smoking cessation, lipid management, blood pressure, shortness of breath symptoms  Total encounter time more than 45 minutes  Greater than  50% was spent in counseling and coordination of care with the patient    Orders Placed This Encounter  Procedures  . EKG 12-Lead     Signed, Esmond Plants, M.D., Ph.D. 04/07/2020  [place], Mazie

## 2020-04-07 ENCOUNTER — Encounter: Payer: Self-pay | Admitting: Cardiovascular Disease

## 2020-04-07 ENCOUNTER — Ambulatory Visit (INDEPENDENT_AMBULATORY_CARE_PROVIDER_SITE_OTHER): Payer: BC Managed Care – PPO | Admitting: Cardiovascular Disease

## 2020-04-07 ENCOUNTER — Other Ambulatory Visit: Payer: Self-pay

## 2020-04-07 ENCOUNTER — Encounter: Payer: Self-pay | Admitting: *Deleted

## 2020-04-07 ENCOUNTER — Encounter: Payer: BC Managed Care – PPO | Admitting: *Deleted

## 2020-04-07 VITALS — BP 160/100 | HR 101 | Ht 63.0 in | Wt 173.5 lb

## 2020-04-07 DIAGNOSIS — I25118 Atherosclerotic heart disease of native coronary artery with other forms of angina pectoris: Secondary | ICD-10-CM | POA: Diagnosis not present

## 2020-04-07 DIAGNOSIS — I10 Essential (primary) hypertension: Secondary | ICD-10-CM | POA: Diagnosis not present

## 2020-04-07 DIAGNOSIS — E785 Hyperlipidemia, unspecified: Secondary | ICD-10-CM | POA: Diagnosis not present

## 2020-04-07 DIAGNOSIS — F17200 Nicotine dependence, unspecified, uncomplicated: Secondary | ICD-10-CM

## 2020-04-07 DIAGNOSIS — Z951 Presence of aortocoronary bypass graft: Secondary | ICD-10-CM | POA: Diagnosis not present

## 2020-04-07 DIAGNOSIS — I252 Old myocardial infarction: Secondary | ICD-10-CM | POA: Diagnosis not present

## 2020-04-07 MED ORDER — VARENICLINE TARTRATE 1 MG PO TABS: 1.0000 mg | ORAL_TABLET | Freq: Two times a day (BID) | ORAL | 4 refills | Status: DC

## 2020-04-07 NOTE — Progress Notes (Signed)
Daily Session Note  Patient Details  Name: Victoria Avery MRN: [id number] Date of Birth: [birth year] Referring Provider:     Cardiac Rehab from 02/28/2020 in [facility] Cardiac and Pulmonary Rehab  Referring Provider  Ida Rogue MD      Encounter Date: 04/07/2020  Check In: Session Check In - 04/07/20 1138      Check-In   Supervising physician immediately available to respond to emergencies  See telemetry face sheet for immediately available ER MD    Location  [facility]-Cardiac & Pulmonary Rehab    Staff Present  Renita Papa, RN BSN;Joseph [address] [place], [place], ACSM CEP, Exercise Physiologist    Virtual Visit  No    Medication changes reported      Yes    Comments  starting Chantix    Fall or balance concerns reported     No    Tobacco Cessation  Use Increase    Current number of cigarettes/nicotine per day      8    Warm-up and Cool-down  Performed on first and last piece of equipment    Resistance Training Performed  Yes    VAD Patient?  No    PAD/SET Patient?  No      Pain Assessment   Currently in Pain?  No/denies          Social History   Tobacco Use  Smoking Status Current Every Day Smoker  . Packs/day: 0.25  . Years: 30.00  . Pack years: 7.50  . Types: Cigarettes  Smokeless Tobacco Never Used    Goals Met:  Independence with exercise equipment Exercise tolerated well No report of cardiac concerns or symptoms Strength training completed today  Goals Unmet:  Not Applicable  Comments: Pt able to follow exercise prescription today without complaint.  Will continue to monitor for progression.    Dr. Emily Filbert is Medical Director for [facility] and [name].

## 2020-04-07 NOTE — Patient Instructions (Addendum)
Medication Instructions:  Chantix, paper script Coupon  Chantix 1 mg  Days 1-3 take 1/2 tablet (0.5 mg) once daily Days 4-7 take 1/2 tablet (0.5 mg) twice daily Day 8 through end of treatment take whole tablet (1 mg) twice daily.  If you need a refill on your cardiac medications before your next appointment, please call your pharmacy.    Lab work: Lipids and LFTS today   If you have labs (blood work) drawn today and your tests are completely normal, you will receive your results only by: Marland Kitchen MyChart Message (if you have MyChart) OR . A paper copy in the mail If you have any lab test that is abnormal or we need to change your treatment, we will call you to review the results.   Testing/Procedures: No new testing needed   Follow-Up: At [facility], you and your health needs are our priority.  As part of our continuing mission to provide you with exceptional heart care, we have created designated Provider Care Teams.  These Care Teams include your primary Cardiologist (physician) and Advanced Practice Providers (APPs -  Physician Assistants and Nurse Practitioners) who all work together to provide you with the care you need, when you need it.  . You will need a follow up appointment in 6 months .  Marland Kitchen Providers on your designated Care Team:   . Nicolasa Ducking, NP . Eula Listen, PA-C . Marisue Ivan, PA-C  Any Other Special Instructions Will Be Listed Below (If Applicable).  For educational health videos Log in to : [email or web address] Or : [email or web address], password : [email or web address]

## 2020-04-08 LAB — LIPID PANEL
Chol/HDL Ratio: 4.6 ratio — ABNORMAL HIGH (ref 0.0–4.4)
Cholesterol, Total: 130 mg/dL (ref 100–199)
HDL: 28 mg/dL — ABNORMAL LOW (ref >39)
LDL Chol Calc (NIH): 67 mg/dL (ref 0–99)
Triglycerides: 207 mg/dL — ABNORMAL HIGH (ref 0–149)
VLDL Cholesterol Cal: 35 mg/dL (ref 5–40)

## 2020-04-08 LAB — HEPATIC FUNCTION PANEL
ALT: 26 IU/L (ref 0–32)
AST: 24 IU/L (ref 0–40)
Albumin: 4.6 g/dL (ref 3.8–4.9)
Alkaline Phosphatase: 149 IU/L — ABNORMAL HIGH (ref 39–117)
Bilirubin Total: 0.4 mg/dL (ref 0.0–1.2)
Bilirubin, Direct: 0.11 mg/dL (ref 0.00–0.40)
Total Protein: 6.8 g/dL (ref 6.0–8.5)

## 2020-04-09 ENCOUNTER — Encounter: Payer: BC Managed Care – PPO | Admitting: *Deleted

## 2020-04-09 ENCOUNTER — Other Ambulatory Visit: Payer: Self-pay

## 2020-04-09 ENCOUNTER — Telehealth: Payer: Self-pay | Admitting: *Deleted

## 2020-04-09 DIAGNOSIS — Z951 Presence of aortocoronary bypass graft: Secondary | ICD-10-CM

## 2020-04-09 DIAGNOSIS — I252 Old myocardial infarction: Secondary | ICD-10-CM | POA: Diagnosis not present

## 2020-04-09 NOTE — Telephone Encounter (Signed)
-----   Message from Antonieta Iba, MD sent at 04/09/2020 11:36 AM EDT ----- Cholesterol at goal Continue current Lipitor dose Would work on smoking cessation post cabg

## 2020-04-09 NOTE — Progress Notes (Signed)
Daily Session Note  Patient Details  Name: Victoria Avery MRN: [id number] Date of Birth: 1966/07/02 Referring Provider:     Cardiac Rehab from 02/28/2020 in [facility] Cardiac and Pulmonary Rehab  Referring Provider  Ida Rogue MD      Encounter Date: 04/09/2020  Check In: Session Check In - 04/09/20 1110      Check-In   Supervising physician immediately available to respond to emergencies  See telemetry face sheet for immediately available ER MD    Location  [facility]-Cardiac & Pulmonary Rehab    Staff Present  Justin Mend RCP,RRT,BSRT;Neely Kammerer Sherryll Burger, RN BSN;Jessica Luan Pulling, MA, RCEP, CCRP, [place], [place], ACSM CEP, Exercise Physiologist    Virtual Visit  No    Medication changes reported      No    Fall or balance concerns reported     No    Warm-up and Cool-down  Performed on first and last piece of equipment    Resistance Training Performed  Yes    VAD Patient?  No    PAD/SET Patient?  No      Pain Assessment   Currently in Pain?  No/denies          Social History   Tobacco Use  Smoking Status Current Every Day Smoker  . Packs/day: 0.25  . Years: 30.00  . Pack years: 7.50  . Types: Cigarettes  Smokeless Tobacco Never Used    Goals Met:  Independence with exercise equipment Exercise tolerated well No report of cardiac concerns or symptoms Strength training completed today  Goals Unmet:  Not Applicable  Comments: Pt able to follow exercise prescription today without complaint.  Will continue to monitor for progression.    Dr. Emily Filbert is Medical Director for [place] [name].

## 2020-04-09 NOTE — Telephone Encounter (Signed)
Spoke with patient and reviewed lab results and recommendations. She verbalized understanding with no further questions at this time.  

## 2020-04-11 ENCOUNTER — Encounter: Payer: BC Managed Care – PPO | Admitting: *Deleted

## 2020-04-11 ENCOUNTER — Other Ambulatory Visit: Payer: Self-pay

## 2020-04-11 DIAGNOSIS — Z951 Presence of aortocoronary bypass graft: Secondary | ICD-10-CM

## 2020-04-11 DIAGNOSIS — I252 Old myocardial infarction: Secondary | ICD-10-CM | POA: Diagnosis not present

## 2020-04-11 NOTE — Progress Notes (Signed)
Daily Session Note  Patient Details  Name: Victoria Avery MRN: [id number] Date of Birth: Jan 23, 1966 Referring Provider:     Cardiac Rehab from 02/28/2020 in [facility] Cardiac and Pulmonary Rehab  Referring Provider  Ida Rogue MD      Encounter Date: 04/11/2020  Check In: Session Check In - 04/11/20 1135      Check-In   Supervising physician immediately available to respond to emergencies  See telemetry face sheet for immediately available ER MD    Location  [facility]-Cardiac & Pulmonary Rehab    Staff Present  Basilia Jumbo, RN, BSN;Jessica Prompton, MA, RCEP, CCRP, CCET;Joseph Alcus Dad, RN BSN    Virtual Visit  No    Medication changes reported      No    Fall or balance concerns reported     No    Warm-up and Cool-down  Performed on first and last piece of equipment    Resistance Training Performed  Yes    VAD Patient?  No    PAD/SET Patient?  No      Pain Assessment   Currently in Pain?  No/denies          Social History   Tobacco Use  Smoking Status Current Every Day Smoker  . Packs/day: 0.25  . Years: 30.00  . Pack years: 7.50  . Types: Cigarettes  Smokeless Tobacco Never Used    Goals Met:  Independence with exercise equipment Exercise tolerated well No report of cardiac concerns or symptoms  Goals Unmet:  Not Applicable  Comments: Pt able to follow exercise prescription today without complaint.  Will continue to monitor for progression.    Dr. Emily Filbert is Medical Director for [place] [name].

## 2020-04-14 ENCOUNTER — Encounter: Payer: BC Managed Care – PPO | Admitting: *Deleted

## 2020-04-14 ENCOUNTER — Other Ambulatory Visit: Payer: Self-pay

## 2020-04-14 DIAGNOSIS — Z951 Presence of aortocoronary bypass graft: Secondary | ICD-10-CM

## 2020-04-14 DIAGNOSIS — I252 Old myocardial infarction: Secondary | ICD-10-CM | POA: Diagnosis not present

## 2020-04-14 NOTE — Progress Notes (Signed)
Daily Session Note  Patient Details  Name: Victoria Avery MRN: [id number] Date of Birth: November 13, 1966 Referring Provider:     Cardiac Rehab from 02/28/2020 in [facility] Cardiac and Pulmonary Rehab  Referring Provider  Ida Rogue MD      Encounter Date: 04/14/2020  Check In: Session Check In - 04/14/20 1138      Check-In   Supervising physician immediately available to respond to emergencies  See telemetry face sheet for immediately available ER MD    Location  [facility]-Cardiac & Pulmonary Rehab    Staff Present  Renita Papa, RN Vickki Hearing, BA, ACSM CEP, Exercise Physiologist;Kelly Amedeo Plenty, BS, ACSM CEP, Exercise Physiologist    Virtual Visit  No    Medication changes reported      No    Fall or balance concerns reported     No    Warm-up and Cool-down  Performed on first and last piece of equipment    Resistance Training Performed  Yes    VAD Patient?  No    PAD/SET Patient?  No      Pain Assessment   Currently in Pain?  No/denies          Social History   Tobacco Use  Smoking Status Current Every Day Smoker  . Packs/day: 0.25  . Years: 30.00  . Pack years: 7.50  . Types: Cigarettes  Smokeless Tobacco Never Used    Goals Met:  Independence with exercise equipment Exercise tolerated well No report of cardiac concerns or symptoms Strength training completed today  Goals Unmet:  Not Applicable  Comments: Pt able to follow exercise prescription today without complaint.  Will continue to monitor for progression.    Dr. Emily Filbert is Medical Director for [place] [name].

## 2020-04-16 ENCOUNTER — Encounter: Payer: BC Managed Care – PPO | Admitting: *Deleted

## 2020-04-16 ENCOUNTER — Encounter: Payer: Self-pay | Admitting: *Deleted

## 2020-04-16 ENCOUNTER — Other Ambulatory Visit: Payer: Self-pay

## 2020-04-16 DIAGNOSIS — Z951 Presence of aortocoronary bypass graft: Secondary | ICD-10-CM

## 2020-04-16 DIAGNOSIS — I252 Old myocardial infarction: Secondary | ICD-10-CM | POA: Diagnosis not present

## 2020-04-16 NOTE — Progress Notes (Signed)
Cardiac Individual Treatment Plan  Patient Details  Name: Victoria Avery MRN: [id number] Date of Birth: 12/14/1965 Referring Provider:     Cardiac Rehab from 02/28/2020 in [facility] Cardiac and Pulmonary Rehab  Referring Provider  Ida Rogue MD      Initial Encounter Date:    Cardiac Rehab from 02/28/2020 in [facility] Cardiac and Pulmonary Rehab  Date  02/28/20      Visit Diagnosis: S/P CABG x 3  Patient's Home Medications on Admission:  Current Outpatient Medications:  .  acetaminophen (TYLENOL) 500 MG tablet, Take 2 tablets (1,000 mg total) by mouth every 6 (six) hours as needed., Disp: 30 tablet, Rfl: 0 .  aspirin EC 325 MG EC tablet, Take 1 tablet (325 mg total) by mouth daily., Disp: 30 tablet, Rfl: 0 .  atorvastatin (LIPITOR) 80 MG tablet, Take 1 tablet (80 mg total) by mouth daily at 6 PM., Disp: 90 tablet, Rfl: 1 .  metoprolol succinate (TOPROL-XL) 50 MG 24 hr tablet, Take 1 tablet (50 mg total) by mouth daily. Take with or immediately following a meal., Disp: 90 tablet, Rfl: 3 .  nitroGLYCERIN (NITROSTAT) 0.4 MG SL tablet, Place 1 tablet (0.4 mg total) under the tongue every 5 (five) minutes as needed for chest pain., Disp: 25 tablet, Rfl: 3 .  varenicline (CHANTIX CONTINUING MONTH PAK) 1 MG tablet, Take 1 tablet (1 mg total) by mouth 2 (two) times daily., Disp: 60 tablet, Rfl: 4  Past Medical History: Past Medical History:  Diagnosis Date  . Cardiomyopathy ([place])    a. 10/2017 Echo: EF 45-50% Gr1 DD, mild MR. Nl LA size. Nl RV fxn and PASP.  Marland Kitchen Hypertension   . Tobacco abuse     Tobacco Use: Social History   Tobacco Use  Smoking Status Current Every Day Smoker  . Packs/day: 0.25  . Years: 30.00  . Pack years: 7.50  . Types: Cigarettes  Smokeless Tobacco Never Used    Labs: Recent Review Flowsheet Data    Labs for ITP Cardiac and Pulmonary Rehab Latest Ref Rng & Units 01/18/2020 01/18/2020 01/18/2020 01/18/2020 04/07/2020   Cholestrol 100 - 199 mg/dL - - - - 130    LDLCALC 0 - 99 mg/dL - - - - 67   HDL >39 mg/dL - - - - 28(L)   Trlycerides 0 - 149 mg/dL - - - - 207(H)   Hemoglobin A1c 4.8 - 5.6 % - - - - -   PHART 7.350 - 7.450 7.318(L) 7.324(L) 7.338(L) 7.357 -   PCO2ART 32.0 - 48.0 mmHg 45.5 45.9 35.0 44.3 -   HCO3 20.0 - 28.0 mmol/L 23.3 24.2 18.7(L) 24.6 -   TCO2 22 - 32 mmol/L 25 26 20(L) 26 -   ACIDBASEDEF 0.0 - 2.0 mmol/L 3.0(H) 2.0 6.0(H) 1.0 -   O2SAT % 98.0 99.0 99.0 98.0 -       Exercise Target Goals: Exercise Program Goal: Individual exercise prescription set using results from initial 6 min walk test and THRR while considering  patient's activity barriers and safety.   Exercise Prescription Goal: Initial exercise prescription builds to 30-45 minutes a day of aerobic activity, 2-3 days per week.  Home exercise guidelines will be given to patient during program as part of exercise prescription that the participant will acknowledge.   Education: Aerobic Exercise & Resistance Training: - Gives group verbal and written instruction on the various components of exercise. Focuses on aerobic and resistive training programs and the benefits of this training and how  to safely progress through these programs..   Education: Exercise & Equipment Safety: - Individual verbal instruction and demonstration of equipment use and safety with use of the equipment.   Cardiac Rehab from 02/26/2020 in [facility] Cardiac and Pulmonary Rehab  Date  02/26/20  Educator  [facility]  Instruction Review Code  1- Verbalizes Understanding      Education: Exercise Physiology & General Exercise Guidelines: - Group verbal and written instruction with models to review the exercise physiology of the cardiovascular system and associated critical values. Provides general exercise guidelines with specific guidelines to those with heart or lung disease.    Education: Flexibility, Balance, Mind/Body Relaxation: Provides group verbal/written instruction on the benefits of flexibility  and balance training, including mind/body exercise modes such as yoga, pilates and tai chi.  Demonstration and skill practice provided.   Activity Barriers & Risk Stratification: Activity Barriers & Cardiac Risk Stratification - 02/28/20 1242      Activity Barriers & Cardiac Risk Stratification   Activity Barriers  Deconditioning;Muscular Weakness;Shortness of Breath    Cardiac Risk Stratification  High       6 Minute Walk: 6 Minute Walk    Row Name 02/28/20 1241         6 Minute Walk   Phase  Initial     Distance  1415 feet     Walk Time  6 minutes     # of Rest Breaks  0     MPH  2.68     METS  4.33     RPE  11     Perceived Dyspnea   2     VO2 Peak  15.14     Symptoms  Yes (comment)     Comments  SOB     Resting HR  89 bpm     Resting BP  136/74     Resting Oxygen Saturation   99 %     Exercise Oxygen Saturation  during 6 min walk  98 %     Max Ex. HR  117 bpm     Max Ex. BP  166/76     2 Minute Post BP  144/74        Oxygen Initial Assessment:   Oxygen Re-Evaluation:   Oxygen Discharge (Final Oxygen Re-Evaluation):   Initial Exercise Prescription: Initial Exercise Prescription - 02/28/20 1200      Date of Initial Exercise RX and Referring Provider   Date  02/28/20    Referring Provider  Ida Rogue MD      Treadmill   MPH  2.6    Grade  1    Minutes  15    METs  3.35      Recumbant Bike   Level  4    RPM  50    Watts  59    Minutes  15    METs  3      NuStep   Level  4    SPM  80    Minutes  15    METs  3      Recumbant Elliptical   Level  2    RPM  50    Minutes  15    METs  3      Prescription Details   Frequency (times per week)  3    Duration  Progress to 30 minutes of continuous aerobic without signs/symptoms of physical distress      Intensity   THRR 40-80% of Max  Heartrate  121-183    Ratings of Perceived Exertion  11-13    Perceived Dyspnea  0-4      Progression   Progression  Continue to progress workloads to  maintain intensity without signs/symptoms of physical distress.      Resistance Training   Training Prescription  Yes    Weight  3 lb    Reps  10-15       Perform Capillary Blood Glucose checks as needed.  Exercise Prescription Changes: Exercise Prescription Changes    Row Name 02/28/20 1200 03/06/20 1300 03/19/20 1100 03/21/20 1100 04/01/20 1500     Response to Exercise   Blood Pressure (Admit)  134/74  130/78  140/88  --  130/86   Blood Pressure (Exercise)  166/76  162/84  144/72  --  124/66   Blood Pressure (Exit)  144/74  124/78  156/88  --  122/76   Heart Rate (Admit)  89 bpm  85 bpm  81 bpm  --  94 bpm   Heart Rate (Exercise)  117 bpm  123 bpm  117 bpm  --  125 bpm   Heart Rate (Exit)  90 bpm  99 bpm  102 bpm  --  103 bpm   Oxygen Saturation (Admit)  99 %  --  --  --  --   Oxygen Saturation (Exercise)  98 %  --  --  --  --   Rating of Perceived Exertion (Exercise)  _0 --  16   Perceived Dyspnea (Exercise)  2  --  --  --  --   Symptoms  SOB  --  none  --  none   Comments  walk test results  first day  --  --  --   Duration  --  --  Continue with 30 min of aerobic exercise without signs/symptoms of physical distress.  --  Continue with 30 min of aerobic exercise without signs/symptoms of physical distress.   Intensity  --  --  THRR unchanged  --  THRR unchanged     Progression   Progression  --  --  Continue to progress workloads to maintain intensity without signs/symptoms of physical distress.  --  Continue to progress workloads to maintain intensity without signs/symptoms of physical distress.   Average METs  --  --  2.68  --  3.36     Resistance Training   Training Prescription  --  --  Yes  --  Yes   Weight  --  --  3 lb  --  3 lb   Reps  --  --  10-15  --  10-15     Interval Training   Interval Training  --  --  No  --  No     Treadmill   MPH  --  --  2.6  --  2.6   Grade  --  --  1  --  1   Minutes  --  --  15  --  15   METs  --  --  3.35  --  3.35      Recumbant Bike   Level  --  --  4  --  4.5   Watts  --  --  --  --  15   Minutes  --  --  15  --  4.31     NuStep   Level  --  --  4  --  5  Minutes  --  --  15  --  15   METs  --  --  2.6  --  3.6     Recumbant Elliptical   Level  --  --  2.5  --  2.5   Minutes  --  --  15  --  15   METs  --  --  2.1  --  2.2     Home Exercise Plan   Plans to continue exercise at  --  --  --  Home (comment) walking and staff videos  --   Frequency  --  --  --  Add 2 additional days to program exercise sessions.  --   Initial Home Exercises Provided  --  --  --  03/21/20  --   [place] Name 04/14/20 1500             Response to Exercise   Blood Pressure (Admit)  126/64       Blood Pressure (Exercise)  152/64       Blood Pressure (Exit)  124/64       Heart Rate (Admit)  82 bpm       Heart Rate (Exercise)  115 bpm       Heart Rate (Exit)  81 bpm       Rating of Perceived Exertion (Exercise)  15       Symptoms  none       Duration  Continue with 30 min of aerobic exercise without signs/symptoms of physical distress.       Intensity  THRR unchanged         Progression   Progression  Continue to progress workloads to maintain intensity without signs/symptoms of physical distress.       Average METs  3.85         Resistance Training   Training Prescription  Yes       Weight  3 lb       Reps  10-15         Interval Training   Interval Training  No         Treadmill   MPH  2.8       Grade  3       Minutes  15       METs  4.3         Recumbant Bike   Level  4.5       Minutes  15       METs  4.9         NuStep   Level  5       Minutes  15       METs  4         Recumbant Elliptical   Level  3       Minutes  15       METs  2.2         Home Exercise Plan   Plans to continue exercise at  Home (comment) walking and staff videos       Frequency  Add 2 additional days to program exercise sessions.       Initial Home Exercises Provided  03/21/20          Exercise  Comments: Exercise Comments    Row Name 03/04/20 1148           Exercise Comments  First full day of exercise!  Patient was oriented to gym and equipment including functions, settings,  policies, and procedures.  Patient's individual exercise prescription and treatment plan were reviewed.  All starting workloads were established based on the results of the 6 minute walk test done at initial orientation visit.  The plan for exercise progression was also introduced and progression will be customized based on patient's performance and goals.          Exercise Goals and Review: Exercise Goals    Row Name 02/28/20 1244             Exercise Goals   Increase Physical Activity  Yes       Intervention  Provide advice, education, support and counseling about physical activity/exercise needs.;Develop an individualized exercise prescription for aerobic and resistive training based on initial evaluation findings, risk stratification, comorbidities and participant's personal goals.       Expected Outcomes  Short Term: Attend rehab on a regular basis to increase amount of physical activity.;Long Term: Add in home exercise to make exercise part of routine and to increase amount of physical activity.;Long Term: Exercising regularly at least 3-5 days a week.       Increase Strength and Stamina  Yes       Intervention  Provide advice, education, support and counseling about physical activity/exercise needs.;Develop an individualized exercise prescription for aerobic and resistive training based on initial evaluation findings, risk stratification, comorbidities and participant's personal goals.       Expected Outcomes  Short Term: Increase workloads from initial exercise prescription for resistance, speed, and METs.;Long Term: Improve cardiorespiratory fitness, muscular endurance and strength as measured by increased METs and functional capacity (6MWT);Short Term: Perform resistance training exercises routinely  during rehab and add in resistance training at home       Able to understand and use rate of perceived exertion (RPE) scale  Yes       Intervention  Provide education and explanation on how to use RPE scale       Expected Outcomes  Short Term: Able to use RPE daily in rehab to express subjective intensity level;Long Term:  Able to use RPE to guide intensity level when exercising independently       Able to understand and use Dyspnea scale  Yes       Intervention  Provide education and explanation on how to use Dyspnea scale       Expected Outcomes  Short Term: Able to use Dyspnea scale daily in rehab to express subjective sense of shortness of breath during exertion;Long Term: Able to use Dyspnea scale to guide intensity level when exercising independently       Knowledge and understanding of Target Heart Rate Range (THRR)  Yes       Intervention  Provide education and explanation of THRR including how the numbers were predicted and where they are located for reference       Expected Outcomes  Short Term: Able to state/look up THRR;Short Term: Able to use daily as guideline for intensity in rehab;Long Term: Able to use THRR to govern intensity when exercising independently       Able to check pulse independently  Yes       Intervention  Provide education and demonstration on how to check pulse in carotid and radial arteries.;Review the importance of being able to check your own pulse for safety during independent exercise       Expected Outcomes  Short Term: Able to explain why pulse checking is important during independent exercise;Long Term: Able to check pulse independently  and accurately       Understanding of Exercise Prescription  Yes       Intervention  Provide education, explanation, and written materials on patient's individual exercise prescription       Expected Outcomes  Short Term: Able to explain program exercise prescription;Long Term: Able to explain home exercise prescription to  exercise independently          Exercise Goals Re-Evaluation : Exercise Goals Re-Evaluation    Row Name 03/04/20 1148 03/19/20 1125 03/21/20 1154 04/01/20 1509 04/02/20 1141     Exercise Goal Re-Evaluation   Exercise Goals Review  Able to understand and use rate of perceived exertion (RPE) scale;Knowledge and understanding of Target Heart Rate Range (THRR);Able to check pulse independently;Understanding of Exercise Prescription  Increase Physical Activity;Increase Strength and Stamina;Understanding of Exercise Prescription  Increase Physical Activity;Increase Strength and Stamina;Understanding of Exercise Prescription;Able to understand and use rate of perceived exertion (RPE) scale;Able to understand and use Dyspnea scale;Knowledge and understanding of Target Heart Rate Range (THRR);Able to check pulse independently  Increase Physical Activity;Increase Strength and Stamina;Able to understand and use rate of perceived exertion (RPE) scale;Able to understand and use Dyspnea scale;Knowledge and understanding of Target Heart Rate Range (THRR);Able to check pulse independently;Understanding of Exercise Prescription  Increase Physical Activity;Increase Strength and Stamina;Able to understand and use rate of perceived exertion (RPE) scale;Able to understand and use Dyspnea scale;Knowledge and understanding of Target Heart Rate Range (THRR);Able to check pulse independently;Understanding of Exercise Prescription   Comments  Reviewed RPE scale, THR and program prescription with pt today.  Pt voiced understanding and was given a copy of goals to take home.  Victoria Avery is off to a great start in rehab.  She is already up to level 2.5 on the recumbent elliptical.  We will continue to monitor her progress.  Reviewed home exercise with pt today.  Pt plans to walk and use staff videos at home for exercise.  Reviewed THR, pulse, RPE, sign and symptoms, NTG use, and when to call 911 or MD.  Also discussed weather  considerations and indoor options.  Pt voiced understanding.  Victoria Avery is progressing well.  She finds walking easier than seated machines.  She does work in [employer] range  Victoria Avery is walking a mile on days she isnt at [facility] and sometimes in addition to HT.   Expected Outcomes  Short: Use RPE daily to regulate intensity. Long: Follow program prescription in THR.  Short: Continue to increase workloads  Long: Continue to follow program prescription.  Short: Start to add in exercise  Long: Continue to improve stamina.  Short:  continue to work in [employer] ranges Long: improve overall MET level  Short:  continue to exercise consistently Long: improve overall stamina   Row Name 04/14/20 1508             Exercise Goal Re-Evaluation   Exercise Goals Review  Increase Physical Activity;Increase Strength and Stamina;Understanding of Exercise Prescription       Comments  Victoria Avery has been doing well in rehab.  She is now up to level 5 on the NuStep and 4.5 on the bike.  She has returned to work, but comes to exercise on an extended lunch hour.  We will continue to monitor her progress.       Expected Outcomes  Short: Increase hand weights  Long: Continue to improve stamina.          Discharge Exercise Prescription (Final Exercise Prescription Changes): Exercise Prescription Changes - 04/14/20  1500      Response to Exercise   Blood Pressure (Admit)  126/64    Blood Pressure (Exercise)  152/64    Blood Pressure (Exit)  124/64    Heart Rate (Admit)  82 bpm    Heart Rate (Exercise)  115 bpm    Heart Rate (Exit)  81 bpm    Rating of Perceived Exertion (Exercise)  15    Symptoms  none    Duration  Continue with 30 min of aerobic exercise without signs/symptoms of physical distress.    Intensity  THRR unchanged      Progression   Progression  Continue to progress workloads to maintain intensity without signs/symptoms of physical distress.    Average METs  3.85      Resistance Training   Training Prescription  Yes     Weight  3 lb    Reps  10-15      Interval Training   Interval Training  No      Treadmill   MPH  2.8    Grade  3    Minutes  15    METs  4.3      Recumbant Bike   Level  4.5    Minutes  15    METs  4.9      NuStep   Level  5    Minutes  15    METs  4      Recumbant Elliptical   Level  3    Minutes  15    METs  2.2      Home Exercise Plan   Plans to continue exercise at  Home (comment)   walking and staff videos   Frequency  Add 2 additional days to program exercise sessions.    Initial Home Exercises Provided  03/21/20       Nutrition:  Target Goals: Understanding of nutrition guidelines, daily intake of sodium '1500mg'$ , cholesterol '200mg'$ , calories 30% from fat and 7% or less from saturated fats, daily to have 5 or more servings of fruits and vegetables.  Education: Controlling Sodium/Reading Food Labels -Group verbal and written material supporting the discussion of sodium use in heart healthy nutrition. Review and explanation with models, verbal and written materials for utilization of the food label.   Education: General Nutrition Guidelines/Fats and Fiber: -Group instruction provided by verbal, written material, models and posters to present the general guidelines for heart healthy nutrition. Gives an explanation and review of dietary fats and fiber.   Biometrics: Pre Biometrics - 02/28/20 1244      Pre Biometrics   Height  5' 3.1" (1.603 m)    Weight  178 lb 4.8 oz (80.9 kg)    BMI (Calculated)  31.47    Single Leg Stand  19.66 seconds        Nutrition Therapy Plan and Nutrition Goals: Nutrition Therapy & Goals - 03/18/20 1201      Nutrition Therapy   Diet  low Na, HH    Drug/Food Interactions  Statins/Certain Fruits    Protein (specify units)  65-70g    Fiber  25 grams    Whole Grain Foods  3 servings    Saturated Fats  12 max. grams    Fruits and Vegetables  5 servings/day    Sodium  1.5 grams      Personal Nutrition Goals   Nutrition  Goal  LT: increase stamina on exertion    Comments  B: oatmeal L: varies D: pt  reports not eating a lot; eating fried foods, not many vegetables. Motivation. Discussed HH eating, discussed planning and shopping on a budget. Pt would like to eat consistently, talked about healthy things to eat on hand; pt reports making bean salad and pasta salad.      Intervention Plan   Intervention  Prescribe, educate and counsel regarding individualized specific dietary modifications aiming towards targeted core components such as weight, hypertension, lipid management, diabetes, heart failure and other comorbidities.;Nutrition handout(s) given to patient.    Expected Outcomes  Short Term Goal: Understand basic principles of dietary content, such as calories, fat, sodium, cholesterol and nutrients.;Short Term Goal: A plan has been developed with personal nutrition goals set during dietitian appointment.;Long Term Goal: Adherence to prescribed nutrition plan.       Nutrition Assessments: Nutrition Assessments - 02/28/20 1329      MEDFICTS Scores   Pre Score  25       MEDIFICTS Score Key:          ?70 Need to make dietary changes          40-70 Heart Healthy Diet         ? 40 Therapeutic Level Cholesterol Diet  Nutrition Goals Re-Evaluation: Nutrition Goals Re-Evaluation    [place] Name 04/09/20 1136             Goals   Nutrition Goal  ST: LT: increase stamina on exertion       Comment  Nutrition has been going well. Preparring bean salad          Nutrition Goals Discharge (Final Nutrition Goals Re-Evaluation): Nutrition Goals Re-Evaluation - 04/09/20 1136      Goals   Nutrition Goal  ST: LT: increase stamina on exertion    Comment  Nutrition has been going well. Preparring bean salad       Psychosocial: Target Goals: Acknowledge presence or absence of significant depression and/or stress, maximize coping skills, provide positive support system. Participant is able to verbalize types and  ability to use techniques and skills needed for reducing stress and depression.   Education: Depression - Provides group verbal and written instruction on the correlation between heart/lung disease and depressed mood, treatment options, and the stigmas associated with seeking treatment.   Education: Sleep Hygiene -Provides group verbal and written instruction about how sleep can affect your health.  Define sleep hygiene, discuss sleep cycles and impact of sleep habits. Review good sleep hygiene tips.     Education: Stress and Anxiety: - Provides group verbal and written instruction about the health risks of elevated stress and causes of high stress.  Discuss the correlation between heart/lung disease and anxiety and treatment options. Review healthy ways to manage with stress and anxiety.    Initial Review & Psychosocial Screening: Initial Psych Review & Screening - 02/26/20 1255      Initial Review   Current issues with  None Identified      Family Dynamics   Good Support System?  Yes    Comments  She can look to her husband, son, brother, sisterin law, sister and co workers for support. She is being positive about improving her health and moving on with her life.      Barriers   Psychosocial barriers to participate in program  There are no identifiable barriers or psychosocial needs.;The patient should benefit from training in stress management and relaxation.      Screening Interventions   Interventions  Encouraged to exercise;Provide feedback about the  scores to participant;To provide support and resources with identified psychosocial needs    Expected Outcomes  Short Term goal: Utilizing psychosocial counselor, staff and physician to assist with identification of specific Stressors or current issues interfering with healing process. Setting desired goal for each stressor or current issue identified.;Long Term Goal: Stressors or current issues are controlled or eliminated.;Short  Term goal: Identification and review with participant of any Quality of Life or Depression concerns found by scoring the questionnaire.;Long Term goal: The participant improves quality of Life and PHQ9 Scores as seen by post scores and/or verbalization of changes       Quality of Life Scores:  Quality of Life - 02/28/20 1328      Quality of Life   Select  Quality of Life      Quality of Life Scores   Health/Function Pre  26.13 %    Socioeconomic Pre  30 %    Psych/Spiritual Pre  27.21 %    Family Pre  28.8 %    GLOBAL Pre  27.54 %      Scores of 19 and below usually indicate a poorer quality of life in these areas.  A difference of  2-3 points is a clinically meaningful difference.  A difference of 2-3 points in the total score of the Quality of Life Index has been associated with significant improvement in overall quality of life, self-image, physical symptoms, and general health in studies assessing change in quality of life.  PHQ-9: Recent Review Flowsheet Data    Depression screen [facility] 2/9 02/28/2020   Decreased Interest 0   Down, Depressed, Hopeless 0   PHQ - 2 Score 0   Altered sleeping 0   Tired, decreased energy 1   Change in appetite 0   Feeling bad or failure about yourself  0   Trouble concentrating 0   Moving slowly or fidgety/restless 0   Suicidal thoughts 0   PHQ-9 Score 1   Difficult doing work/chores Not difficult at all     Interpretation of Total Score  Total Score Depression Severity:  1-4 = Minimal depression, 5-9 = Mild depression, 10-14 = Moderate depression, 15-19 = Moderately severe depression, 20-27 = Severe depression   Psychosocial Evaluation and Intervention: Psychosocial Evaluation - 02/26/20 1259      Psychosocial Evaluation & Interventions   Interventions  Encouraged to exercise with the program and follow exercise prescription    Comments  She can look to her husband, son, brother, sisterin law, sister and co workers for support. She is being  positive about improving her health and moving on with her life.    Expected Outcomes  Short: Attend Heart[employer] stress management education to decrease stress. Long: Maintain exercise Post HeartTrack to keep stress at a minimum.    Continue Psychosocial Services   Follow up required by staff       Psychosocial Re-Evaluation: Psychosocial Re-Evaluation    [place] Name 04/02/20 1139             Psychosocial Re-Evaluation   Current issues with  Current Stress Concerns;Current Sleep Concerns       Comments  Victoria Avery sleeps well.  She is concerned about going back to work.  They are allowing her to come to HT on her lunch break.  She has good support fro [facility], family and friends.       Expected Outcomes  Short:  lean on support system when needed Long:  maintain positive outlook  Psychosocial Discharge (Final Psychosocial Re-Evaluation): Psychosocial Re-Evaluation - 04/02/20 1139      Psychosocial Re-Evaluation   Current issues with  Current Stress Concerns;Current Sleep Concerns    Comments  Victoria Avery sleeps well.  She is concerned about going back to work.  They are allowing her to come to HT on her lunch break.  She has good support fro [facility], family and friends.    Expected Outcomes  Short:  lean on support system when needed Long:  maintain positive outlook       Vocational Rehabilitation: Provide vocational rehab assistance to qualifying candidates.   Vocational Rehab Evaluation & Intervention:   Education: Education Goals: Education classes will be provided on a variety of topics geared toward better understanding of heart health and risk factor modification. Participant will state understanding/return demonstration of topics presented as noted by education test scores.  Learning Barriers/Preferences: Learning Barriers/Preferences - 02/26/20 1300      Learning Barriers/Preferences   Learning Barriers  Sight   wears glasses   Learning Preferences  None       General  Cardiac Education Topics:  AED/CPR: - Group verbal and written instruction with the use of models to demonstrate the basic use of the AED with the basic ABC's of resuscitation.   Anatomy & Physiology of the Heart: - Group verbal and written instruction and models provide basic cardiac anatomy and physiology, with the coronary electrical and arterial systems. Review of Valvular disease and Heart Failure   Cardiac Procedures: - Group verbal and written instruction to review commonly prescribed medications for heart disease. Reviews the medication, class of the drug, and side effects. Includes the steps to properly store meds and maintain the prescription regimen. (beta blockers and nitrates)   Cardiac Medications I: - Group verbal and written instruction to review commonly prescribed medications for heart disease. Reviews the medication, class of the drug, and side effects. Includes the steps to properly store meds and maintain the prescription regimen.   Cardiac Medications II: -Group verbal and written instruction to review commonly prescribed medications for heart disease. Reviews the medication, class of the drug, and side effects. (all other drug classes)    Go Sex-Intimacy & Heart Disease, Get SMART - Goal Setting: - Group verbal and written instruction through game format to discuss heart disease and the return to sexual intimacy. Provides group verbal and written material to discuss and apply goal setting through the application of the S.M.A.R.T. Method.   Other Matters of the Heart: - Provides group verbal, written materials and models to describe Stable Angina and Peripheral Artery. Includes description of the disease process and treatment options available to the cardiac patient.   Infection Prevention: - Provides verbal and written material to individual with discussion of infection control including proper hand washing and proper equipment cleaning during exercise  session.   Cardiac Rehab from 02/26/2020 in [facility] Cardiac and Pulmonary Rehab  Date  02/26/20  Educator  [facility]  Instruction Review Code  1- Verbalizes Understanding      Falls Prevention: - Provides verbal and written material to individual with discussion of falls prevention and safety.   Cardiac Rehab from 02/26/2020 in [facility] Cardiac and Pulmonary Rehab  Date  02/26/20  Educator  [facility]  Instruction Review Code  1- Verbalizes Understanding      Other: -Provides group and verbal instruction on various topics (see comments)   Knowledge Questionnaire Score: Knowledge Questionnaire Score - 02/28/20 1329      Knowledge Questionnaire Score  Pre Score  24/26 Education Focus: Angina, Exercise       Core Components/Risk Factors/Patient Goals at Admission: Personal Goals and Risk Factors at Admission - 02/28/20 1245      Core Components/Risk Factors/Patient Goals on Admission    Weight Management  Yes;Weight Loss;Obesity    Intervention  Weight Management: Develop a combined nutrition and exercise program designed to reach desired caloric intake, while maintaining appropriate intake of nutrient and fiber, sodium and fats, and appropriate energy expenditure required for the weight goal.;Weight Management: Provide education and appropriate resources to help participant work on and attain dietary goals.;Weight Management/Obesity: Establish reasonable short term and long term weight goals.;Obesity: Provide education and appropriate resources to help participant work on and attain dietary goals.    Admit Weight  178 lb 4.8 oz (80.9 kg)    Goal Weight: Short Term  173 lb (78.5 kg)    Goal Weight: Long Term  168 lb (76.2 kg)    Expected Outcomes  Short Term: Continue to assess and modify interventions until short term weight is achieved;Long Term: Adherence to nutrition and physical activity/exercise program aimed toward attainment of established weight goal;Weight Loss: Understanding of general  recommendations for a balanced deficit meal plan, which promotes 1-2 lb weight loss per week and includes a negative energy balance of [phone] kcal/d;Understanding recommendations for meals to include 15-35% energy as protein, 25-35% energy from fat, 35-60% energy from carbohydrates, less than '200mg'$  of dietary cholesterol, 20-35 gm of total fiber daily;Understanding of distribution of calorie intake throughout the day with the consumption of 4-5 meals/snacks    Tobacco Cessation  Yes    Number of packs per day  2 cigs per day    Intervention  Assist the participant in steps to quit. Provide individualized education and counseling about committing to Tobacco Cessation, relapse prevention, and pharmacological support that can be provided by physician.;Advice worker, assist with locating and accessing local/national Quit Smoking programs, and support quit date choice.    Expected Outcomes  Short Term: Will demonstrate readiness to quit, by selecting a quit date.;Short Term: Will quit all tobacco product use, adhering to prevention of relapse plan.;Long Term: Complete abstinence from all tobacco products for at least 12 months from quit date.    Hypertension  Yes    Intervention  Provide education on lifestyle modifcations including regular physical activity/exercise, weight management, moderate sodium restriction and increased consumption of fresh fruit, vegetables, and low fat dairy, alcohol moderation, and smoking cessation.;Monitor prescription use compliance.    Expected Outcomes  Short Term: Continued assessment and intervention until BP is < 140/68m HG in hypertensive participants. < 130/815mHG in hypertensive participants with diabetes, heart failure or chronic kidney disease.;Long Term: Maintenance of blood pressure at goal levels.    Lipids  Yes    Intervention  Provide education and support for participant on nutrition & aerobic/resistive exercise along with prescribed medications  to achieve LDL '70mg'$ , HDL >'40mg'$ .    Expected Outcomes  Long Term: Cholesterol controlled with medications as prescribed, with individualized exercise RX and with personalized nutrition plan. Value goals: LDL < '70mg'$ , HDL > 40 mg.;Short Term: Participant states understanding of desired cholesterol values and is compliant with medications prescribed. Participant is following exercise prescription and nutrition guidelines.       Education:Diabetes - Individual verbal and written instruction to review signs/symptoms of diabetes, desired ranges of glucose level fasting, after meals and with exercise. Acknowledge that pre and post exercise glucose checks will be done  for 3 sessions at entry of program.   Education: Know Your Numbers and Risk Factors: -Group verbal and written instruction about important numbers in your health.  Discussion of what are risk factors and how they play a role in the disease process.  Review of Cholesterol, Blood Pressure, Diabetes, and BMI and the role they play in your overall health.   Core Components/Risk Factors/Patient Goals Review:  Goals and Risk Factor Review    Row Name 04/02/20 1137             Core Components/Risk Factors/Patient Goals Review   Personal Goals Review  Weight Management/Obesity;Hypertension;Lipids;Tobacco Cessation       Review  Victoria Avery is taking meds as directed.  She sees her Dr Monday and plans to ask about the patch or other options.  She goes back to work Tuesday.  She does check BP at home or the [employer].       Expected Outcomes  Short:  follow up with Dr about smoking Long:  quit smoking          Core Components/Risk Factors/Patient Goals at Discharge (Final Review):  Goals and Risk Factor Review - 04/02/20 1137      Core Components/Risk Factors/Patient Goals Review   Personal Goals Review  Weight Management/Obesity;Hypertension;Lipids;Tobacco Cessation    Review  Victoria Avery is taking meds as directed.  She sees her Dr Monday  and plans to ask about the patch or other options.  She goes back to work Tuesday.  She does check BP at home or the [employer].    Expected Outcomes  Short:  follow up with Dr about smoking Long:  quit smoking       ITP Comments: ITP Comments    Row Name 02/26/20 1305 02/28/20 1241 03/04/20 1148 03/18/20 1223 03/19/20 0520   ITP Comments  Virtual Orientation performed. Patient informed when to come in for RD and EP orientation. Diagnosis can be found in [facility] 01/16/2020.  Completed 6MWT and gym orientation.  Initial ITP created and sent for review to Dr. Caryl Comes covering for Dr. Emily Filbert, Medical Director.  First full day of exercise!  Patient was oriented to gym and equipment including functions, settings, policies, and procedures.  Patient's individual exercise prescription and treatment plan were reviewed.  All starting workloads were established based on the results of the 6 minute walk test done at initial orientation visit.  The plan for exercise progression was also introduced and progression will be customized based on patient's performance and goals.  Completed Initial RD Eval  30 Day review completed. Medical Director review done, changes made as directed,and approval shown by signature of Market researcher.   [place] Name 04/16/20 0539           ITP Comments  30 Day review completed. ITP review done, changes made as directed,and approval shown by signature of  Scientist, research (life sciences).          Comments:

## 2020-04-16 NOTE — Progress Notes (Signed)
Daily Session Note  Patient Details  Name: Victoria Avery MRN: [id number] Date of Birth: Jun 10, 1966 Referring Provider:     Cardiac Rehab from 02/28/2020 in [facility] Cardiac and Pulmonary Rehab  Referring Provider  Ida Rogue MD      Encounter Date: 04/16/2020  Check In: Session Check In - 04/16/20 1111      Check-In   Supervising physician immediately available to respond to emergencies  See telemetry face sheet for immediately available ER MD    Location  [facility]-Cardiac & Pulmonary Rehab    Staff Present  Alberteen Sam, MA, RCEP, CCRP, CCET;Joseph Hood RCP,RRT,BSRT;Bristal Steffy [place], RN [place], [place], ACSM CEP, Exercise Physiologist    Virtual Visit  No    Medication changes reported      No    Fall or balance concerns reported     No    Warm-up and Cool-down  Performed on first and last piece of equipment    Resistance Training Performed  Yes    VAD Patient?  No    PAD/SET Patient?  No      Pain Assessment   Currently in Pain?  No/denies          Social History   Tobacco Use  Smoking Status Current Every Day Smoker  . Packs/day: 0.25  . Years: 30.00  . Pack years: 7.50  . Types: Cigarettes  Smokeless Tobacco Never Used    Goals Met:  Independence with exercise equipment Exercise tolerated well No report of cardiac concerns or symptoms Strength training completed today  Goals Unmet:  Not Applicable  Comments: Pt able to follow exercise prescription today without complaint.  Will continue to monitor for progression.    Dr. Emily Filbert is Medical Director for [place] and [name].

## 2020-04-18 ENCOUNTER — Encounter: Payer: BC Managed Care – PPO | Admitting: *Deleted

## 2020-04-18 ENCOUNTER — Other Ambulatory Visit: Payer: Self-pay

## 2020-04-18 DIAGNOSIS — Z951 Presence of aortocoronary bypass graft: Secondary | ICD-10-CM | POA: Diagnosis not present

## 2020-04-18 DIAGNOSIS — I252 Old myocardial infarction: Secondary | ICD-10-CM | POA: Diagnosis not present

## 2020-04-18 NOTE — Progress Notes (Signed)
Daily Session Note  Patient Details  Name: Victoria Avery MRN: [id number] Date of Birth: 1966-01-20 Referring Provider:     Cardiac Rehab from 02/28/2020 in [facility] Cardiac and Pulmonary Rehab  Referring Provider  Ida Rogue MD      Encounter Date: 04/18/2020  Check In: Session Check In - 04/18/20 1115      Check-In   Supervising physician immediately available to respond to emergencies  See telemetry face sheet for immediately available ER MD    Location  [facility]-Cardiac & Pulmonary Rehab    Staff Present  Alberteen Sam, MA, RCEP, CCRP, CCET;Joseph Alcus Dad, RN BSN    Virtual Visit  No    Medication changes reported      No    Fall or balance concerns reported     No    Warm-up and Cool-down  Performed on first and last piece of equipment    Resistance Training Performed  Yes    VAD Patient?  No    PAD/SET Patient?  No      Pain Assessment   Currently in Pain?  No/denies          Social History   Tobacco Use  Smoking Status Current Every Day Smoker  . Packs/day: 0.25  . Years: 30.00  . Pack years: 7.50  . Types: Cigarettes  Smokeless Tobacco Never Used    Goals Met:  Independence with exercise equipment Exercise tolerated well No report of cardiac concerns or symptoms Strength training completed today  Goals Unmet:  Not Applicable  Comments: Pt able to follow exercise prescription today without complaint.  Will continue to monitor for progression.    Dr. Emily Filbert is Medical Director for [place] and [name].

## 2020-04-21 ENCOUNTER — Encounter: Payer: BC Managed Care – PPO | Admitting: *Deleted

## 2020-04-21 ENCOUNTER — Other Ambulatory Visit: Payer: Self-pay

## 2020-04-21 DIAGNOSIS — Z951 Presence of aortocoronary bypass graft: Secondary | ICD-10-CM

## 2020-04-21 DIAGNOSIS — I252 Old myocardial infarction: Secondary | ICD-10-CM | POA: Diagnosis not present

## 2020-04-21 NOTE — Progress Notes (Signed)
Daily Session Note  Patient Details  Name: Victoria Avery MRN: [id number] Date of Birth: [birth year] Referring Provider:     Cardiac Rehab from 02/28/2020 in [facility] Cardiac and Pulmonary Rehab  Referring Provider  Ida Rogue MD      Encounter Date: 04/21/2020  Check In: Session Check In - 04/21/20 1146      Check-In   Supervising physician immediately available to respond to emergencies  See telemetry face sheet for immediately available ER MD    Location  [facility]-Cardiac & Pulmonary Rehab    Staff Present  Renita Papa, RN Moises Blood, BS, ACSM CEP, Exercise Physiologist;Amanda Oletta [place], [place], ACSM CEP, Exercise Physiologist    Virtual Visit  No    Medication changes reported      No    Fall or balance concerns reported     No    Warm-up and Cool-down  Performed on first and last piece of equipment    Resistance Training Performed  Yes    VAD Patient?  No    PAD/SET Patient?  No      Pain Assessment   Currently in Pain?  No/denies          Social History   Tobacco Use  Smoking Status Current Every Day Smoker  . Packs/day: 0.25  . Years: 30.00  . Pack years: 7.50  . Types: Cigarettes  Smokeless Tobacco Never Used    Goals Met:  Independence with exercise equipment Exercise tolerated well No report of cardiac concerns or symptoms Strength training completed today  Goals Unmet:  Not Applicable  Comments: Pt able to follow exercise prescription today without complaint.  Will continue to monitor for progression.    Dr. Emily Filbert is Medical Director for [place] and [name].

## 2020-04-23 ENCOUNTER — Other Ambulatory Visit: Payer: Self-pay

## 2020-04-23 ENCOUNTER — Encounter: Payer: BC Managed Care – PPO | Admitting: *Deleted

## 2020-04-23 DIAGNOSIS — Z951 Presence of aortocoronary bypass graft: Secondary | ICD-10-CM | POA: Diagnosis not present

## 2020-04-23 DIAGNOSIS — I252 Old myocardial infarction: Secondary | ICD-10-CM | POA: Diagnosis not present

## 2020-04-23 NOTE — Progress Notes (Signed)
Daily Session Note  Patient Details  Name: Victoria Avery MRN: [id number] Date of Birth: 1966/09/06 Referring Provider:     Cardiac Rehab from 02/28/2020 in [facility] Cardiac and Pulmonary Rehab  Referring Provider  Ida Rogue MD      Encounter Date: 04/23/2020  Check In: Session Check In - 04/23/20 1115      Check-In   Supervising physician immediately available to respond to emergencies  See telemetry face sheet for immediately available ER MD    Location  [facility]-Cardiac & Pulmonary Rehab    Staff Present  Renita Papa, RN BSN;Joseph Hood RCP,RRT,BSRT;Amanda Oletta [place], [place], ACSM CEP, Exercise Physiologist    Virtual Visit  No    Medication changes reported      No    Fall or balance concerns reported     No    Warm-up and Cool-down  Performed on first and last piece of equipment    Resistance Training Performed  Yes    VAD Patient?  No    PAD/SET Patient?  No      Pain Assessment   Currently in Pain?  No/denies          Social History   Tobacco Use  Smoking Status Current Every Day Smoker  . Packs/day: 0.25  . Years: 30.00  . Pack years: 7.50  . Types: Cigarettes  Smokeless Tobacco Never Used    Goals Met:  Independence with exercise equipment Exercise tolerated well No report of cardiac concerns or symptoms Strength training completed today  Goals Unmet:  Not Applicable  Comments: Pt able to follow exercise prescription today without complaint.  Will continue to monitor for progression.    Dr. Emily Filbert is Medical Director for [place] and [name].

## 2020-04-25 ENCOUNTER — Encounter: Payer: BC Managed Care – PPO | Admitting: *Deleted

## 2020-04-25 ENCOUNTER — Other Ambulatory Visit: Payer: Self-pay

## 2020-04-25 DIAGNOSIS — I252 Old myocardial infarction: Secondary | ICD-10-CM | POA: Diagnosis not present

## 2020-04-25 DIAGNOSIS — Z951 Presence of aortocoronary bypass graft: Secondary | ICD-10-CM

## 2020-04-25 NOTE — Progress Notes (Signed)
Daily Session Note  Patient Details  Name: Victoria Avery MRN: [id number] Date of Birth: Jun 30, 1966 Referring Provider:     Cardiac Rehab from 02/28/2020 in [facility] Cardiac and Pulmonary Rehab  Referring Provider  Ida Rogue MD      Encounter Date: 04/25/2020  Check In: Session Check In - 04/25/20 1140      Check-In   Supervising physician immediately available to respond to emergencies  See telemetry face sheet for immediately available ER MD    Location  [facility]-Cardiac & Pulmonary Rehab    Staff Present  Heath Lark, RN, BSN, CCRP;Jessica [place], MA, RCEP, CCRP, CCET;Joseph Brady RCP,RRT,BSRT    Virtual Visit  No    Medication changes reported      No    Fall or balance concerns reported     No    Tobacco Cessation  Use Decreased    Current number of cigarettes/nicotine per day      7    Warm-up and Cool-down  Performed on first and last piece of equipment    Resistance Training Performed  Yes    VAD Patient?  No    PAD/SET Patient?  No      Pain Assessment   Currently in Pain?  No/denies          Social History   Tobacco Use  Smoking Status Current Every Day Smoker  . Packs/day: 0.25  . Years: 30.00  . Pack years: 7.50  . Types: Cigarettes  Smokeless Tobacco Never Used    Goals Met:  Independence with exercise equipment Exercise tolerated well Personal goals reviewed No report of cardiac concerns or symptoms  Goals Unmet:  Not Applicable  Comments: Pt able to follow exercise prescription today without complaint.  Will continue to monitor for progression.    Dr. Emily Filbert is Medical Director for [place] and [name].

## 2020-04-30 ENCOUNTER — Other Ambulatory Visit: Payer: Self-pay

## 2020-04-30 ENCOUNTER — Encounter: Payer: BC Managed Care – PPO | Attending: Cardiovascular Disease | Admitting: *Deleted

## 2020-04-30 DIAGNOSIS — I252 Old myocardial infarction: Secondary | ICD-10-CM | POA: Insufficient documentation

## 2020-04-30 DIAGNOSIS — Z951 Presence of aortocoronary bypass graft: Secondary | ICD-10-CM | POA: Diagnosis not present

## 2020-04-30 NOTE — Progress Notes (Signed)
Daily Session Note  Patient Details  Name: Victoria Avery MRN: [id number] Date of Birth: 03/19/1966 Referring Provider:     Cardiac Rehab from 02/28/2020 in [facility] Cardiac and Pulmonary Rehab  Referring Provider  Ida Rogue MD      Encounter Date: 04/30/2020  Check In:      Social History   Tobacco Use  Smoking Status Current Every Day Smoker  . Packs/day: 0.25  . Years: 30.00  . Pack years: 7.50  . Types: Cigarettes  Smokeless Tobacco Never Used    Goals Met:  Independence with exercise equipment Exercise tolerated well No report of cardiac concerns or symptoms  Goals Unmet:  Not Applicable  Comments: Pt able to follow exercise prescription today without complaint.  Will continue to monitor for progression.   Dr. Emily Filbert is Medical Director for [place] and [name].

## 2020-05-02 ENCOUNTER — Encounter: Payer: BC Managed Care – PPO | Admitting: *Deleted

## 2020-05-02 ENCOUNTER — Other Ambulatory Visit: Payer: Self-pay

## 2020-05-02 DIAGNOSIS — Z951 Presence of aortocoronary bypass graft: Secondary | ICD-10-CM

## 2020-05-02 DIAGNOSIS — I252 Old myocardial infarction: Secondary | ICD-10-CM | POA: Diagnosis not present

## 2020-05-02 NOTE — Progress Notes (Signed)
Daily Session Note  Patient Details  Name: Victoria Avery MRN: [id number] Date of Birth: January 01, 1966 Referring Provider:     Cardiac Rehab from 02/28/2020 in [facility] Cardiac and Pulmonary Rehab  Referring Provider  Ida Rogue MD      Encounter Date: 05/02/2020  Check In: Session Check In - 05/02/20 1117      Check-In   Supervising physician immediately available to respond to emergencies  See telemetry face sheet for immediately available ER MD    Location  [facility]-Cardiac & Pulmonary Rehab    Staff Present  Nyoka Cowden, RN, BSN, MA;Meredith Sherryll Burger, RN BSN;Melissa Tilford Pillar RDN, LDN    Virtual Visit  No    Medication changes reported      No    Fall or balance concerns reported     No    Tobacco Cessation  No Change    Warm-up and Cool-down  Performed on first and last piece of equipment    Resistance Training Performed  Yes    VAD Patient?  No    PAD/SET Patient?  No      Pain Assessment   Currently in Pain?  No/denies          Social History   Tobacco Use  Smoking Status Current Every Day Smoker  . Packs/day: 0.25  . Years: 30.00  . Pack years: 7.50  . Types: Cigarettes  Smokeless Tobacco Never Used    Goals Met:  Independence with exercise equipment Exercise tolerated well No report of cardiac concerns or symptoms  Goals Unmet:  Not Applicable  Comments: Pt able to follow exercise prescription today without complaint.  Will continue to monitor for progression.    Dr. Emily Filbert is Medical Director for [place] and [name].

## 2020-05-07 ENCOUNTER — Encounter: Payer: BC Managed Care – PPO | Admitting: *Deleted

## 2020-05-07 DIAGNOSIS — I252 Old myocardial infarction: Secondary | ICD-10-CM | POA: Diagnosis not present

## 2020-05-07 DIAGNOSIS — Z951 Presence of aortocoronary bypass graft: Secondary | ICD-10-CM

## 2020-05-07 NOTE — Progress Notes (Signed)
Daily Session Note  Patient Details  Name: Victoria Avery MRN: [id number] Date of Birth: 04/13/1966 Referring Provider:     Cardiac Rehab from 02/28/2020 in [facility] Cardiac and Pulmonary Rehab  Referring Provider  Ida Rogue MD      Encounter Date: 05/07/2020  Check In: Session Check In - 05/07/20 1116      Check-In   Supervising physician immediately available to respond to emergencies  See telemetry face sheet for immediately available ER MD    Location  [facility]-Cardiac & Pulmonary Rehab    Staff Present  Heath Lark, RN, BSN, CCRP;Melissa Caiola RDN, LDN;Joseph Veteran [employer]    Virtual Visit  No    Medication changes reported      No    Fall or balance concerns reported     No    Tobacco Cessation  Use Increase    Current number of cigarettes/nicotine per day      12    Warm-up and Cool-down  Performed on first and last piece of equipment    Resistance Training Performed  Yes    VAD Patient?  No    PAD/SET Patient?  No      Pain Assessment   Currently in Pain?  No/denies          Social History   Tobacco Use  Smoking Status Current Every Day Smoker  . Packs/day: 0.25  . Years: 30.00  . Pack years: 7.50  . Types: Cigarettes  Smokeless Tobacco Never Used    Goals Met:  Independence with exercise equipment Exercise tolerated well No report of cardiac concerns or symptoms  Goals Unmet:  Not Applicable  Comments: Pt able to follow exercise prescription today without complaint.  Will continue to monitor for progression.    Dr. Emily Filbert is Medical Director for [place] and [name].

## 2020-05-12 ENCOUNTER — Encounter: Payer: BC Managed Care – PPO | Admitting: *Deleted

## 2020-05-12 ENCOUNTER — Other Ambulatory Visit: Payer: Self-pay

## 2020-05-12 DIAGNOSIS — Z951 Presence of aortocoronary bypass graft: Secondary | ICD-10-CM

## 2020-05-12 DIAGNOSIS — I252 Old myocardial infarction: Secondary | ICD-10-CM | POA: Diagnosis not present

## 2020-05-12 NOTE — Progress Notes (Signed)
Daily Session Note  Patient Details  Name: Victoria Avery MRN: [id number] Date of Birth: Apr 17, 1966 Referring Provider:     Cardiac Rehab from 02/28/2020 in [facility] Cardiac and Pulmonary Rehab  Referring Provider Ida Rogue MD      Encounter Date: 05/12/2020  Check In:  Session Check In - 05/12/20 1116      Check-In   Supervising physician immediately available to respond to emergencies See telemetry face sheet for immediately available ER MD    Location [facility]-Cardiac & Pulmonary Rehab    Staff Present Renita Papa, RN Moises Blood, BS, ACSM CEP, Exercise Physiologist;Amanda Oletta Darter, [place], ACSM CEP, Exercise Physiologist    Virtual Visit No    Medication changes reported     No    Fall or balance concerns reported    No    Tobacco Cessation No Change    Warm-up and Cool-down Performed on first and last piece of equipment    Resistance Training Performed Yes    VAD Patient? No    PAD/SET Patient? No      Pain Assessment   Currently in Pain? No/denies              Social History   Tobacco Use  Smoking Status Current Every Day Smoker  . Packs/day: 0.25  . Years: 30.00  . Pack years: 7.50  . Types: Cigarettes  Smokeless Tobacco Never Used    Goals Met:  Independence with exercise equipment Exercise tolerated well No report of cardiac concerns or symptoms Strength training completed today  Goals Unmet:  Not Applicable  Comments: Pt able to follow exercise prescription today without complaint.  Will continue to monitor for progression.    Dr. Emily Filbert is Medical Director for [place] and [name].

## 2020-05-14 ENCOUNTER — Other Ambulatory Visit: Payer: Self-pay

## 2020-05-14 ENCOUNTER — Encounter: Payer: BC Managed Care – PPO | Admitting: *Deleted

## 2020-05-14 ENCOUNTER — Encounter: Payer: Self-pay | Admitting: *Deleted

## 2020-05-14 DIAGNOSIS — Z951 Presence of aortocoronary bypass graft: Secondary | ICD-10-CM

## 2020-05-14 DIAGNOSIS — I252 Old myocardial infarction: Secondary | ICD-10-CM | POA: Diagnosis not present

## 2020-05-14 NOTE — Progress Notes (Signed)
Daily Session Note  Patient Details  Name: Victoria Avery MRN: [id number] Date of Birth: [birth year] Referring Provider:     Cardiac Rehab from 02/28/2020 in [facility] Cardiac and Pulmonary Rehab  Referring Provider Ida Rogue MD      Encounter Date: 05/14/2020  Check In:  Session Check In - 05/14/20 1118      Check-In   Supervising physician immediately available to respond to emergencies See telemetry face sheet for immediately available ER MD    Location [facility]-Cardiac & Pulmonary Rehab    Staff Present Justin Mend RCP,RRT,BSRT;Elliotte Marsalis Sherryll Burger, RN Vickki Hearing, BA, ACSM CEP, Exercise Physiologist    Virtual Visit No    Medication changes reported     No    Fall or balance concerns reported    No    Tobacco Cessation Use Decreased    Current number of cigarettes/nicotine per day     9    Warm-up and Cool-down Performed on first and last piece of equipment    Resistance Training Performed Yes    VAD Patient? No    PAD/SET Patient? No      Pain Assessment   Currently in Pain? No/denies              Social History   Tobacco Use  Smoking Status Current Every Day Smoker  . Packs/day: 0.25  . Years: 30.00  . Pack years: 7.50  . Types: Cigarettes  Smokeless Tobacco Never Used    Goals Met:  Independence with exercise equipment Exercise tolerated well No report of cardiac concerns or symptoms Strength training completed today  Goals Unmet:  Not Applicable  Comments: Pt able to follow exercise prescription today without complaint.  Will continue to monitor for progression.    Dr. Emily Filbert is Medical Director for [place] and [name].

## 2020-05-14 NOTE — Progress Notes (Signed)
Cardiac Individual Treatment Plan  Patient Details  Name: DELPHINE SIZEMORE MRN: [id number] Date of Birth: 06/10/1966 Referring Provider:     Cardiac Rehab from 02/28/2020 in [facility] Cardiac and Pulmonary Rehab  Referring Provider Ida Rogue MD      Initial Encounter Date:    Cardiac Rehab from 02/28/2020 in [facility] Cardiac and Pulmonary Rehab  Date 02/28/20      Visit Diagnosis: S/P CABG x 3  Patient's Home Medications on Admission:  Current Outpatient Medications:  .  acetaminophen (TYLENOL) 500 MG tablet, Take 2 tablets (1,000 mg total) by mouth every 6 (six) hours as needed., Disp: 30 tablet, Rfl: 0 .  aspirin EC 325 MG EC tablet, Take 1 tablet (325 mg total) by mouth daily., Disp: 30 tablet, Rfl: 0 .  atorvastatin (LIPITOR) 80 MG tablet, Take 1 tablet (80 mg total) by mouth daily at 6 PM., Disp: 90 tablet, Rfl: 1 .  metoprolol succinate (TOPROL-XL) 50 MG 24 hr tablet, Take 1 tablet (50 mg total) by mouth daily. Take with or immediately following a meal., Disp: 90 tablet, Rfl: 3 .  nitroGLYCERIN (NITROSTAT) 0.4 MG SL tablet, Place 1 tablet (0.4 mg total) under the tongue every 5 (five) minutes as needed for chest pain., Disp: 25 tablet, Rfl: 3 .  varenicline (CHANTIX CONTINUING MONTH PAK) 1 MG tablet, Take 1 tablet (1 mg total) by mouth 2 (two) times daily., Disp: 60 tablet, Rfl: 4  Past Medical History: Past Medical History:  Diagnosis Date  . Cardiomyopathy ([place])    a. 10/2017 Echo: EF 45-50% Gr1 DD, mild MR. Nl LA size. Nl RV fxn and PASP.  Marland Kitchen Hypertension   . Tobacco abuse     Tobacco Use: Social History   Tobacco Use  Smoking Status Current Every Day Smoker  . Packs/day: 0.25  . Years: 30.00  . Pack years: 7.50  . Types: Cigarettes  Smokeless Tobacco Never Used    Labs: Recent Review Flowsheet Data    Labs for ITP Cardiac and Pulmonary Rehab Latest Ref Rng & Units 01/18/2020 01/18/2020 01/18/2020 01/18/2020 04/07/2020   Cholestrol 100 - 199 mg/dL - - - - 130    LDLCALC 0 - 99 mg/dL - - - - 67   HDL >39 mg/dL - - - - 28(L)   Trlycerides 0 - 149 mg/dL - - - - 207(H)   Hemoglobin A1c 4.8 - 5.6 % - - - - -   PHART 7.35 - 7.45 7.318(L) 7.324(L) 7.338(L) 7.357 -   PCO2ART 32 - 48 mmHg 45.5 45.9 35.0 44.3 -   HCO3 20.0 - 28.0 mmol/L 23.3 24.2 18.7(L) 24.6 -   TCO2 22 - 32 mmol/L 25 26 20(L) 26 -   ACIDBASEDEF 0.0 - 2.0 mmol/L 3.0(H) 2.0 6.0(H) 1.0 -   O2SAT % 98.0 99.0 99.0 98.0 -       Exercise Target Goals: Exercise Program Goal: Individual exercise prescription set using results from initial 6 min walk test and THRR while considering  patient's activity barriers and safety.   Exercise Prescription Goal: Initial exercise prescription builds to 30-45 minutes a day of aerobic activity, 2-3 days per week.  Home exercise guidelines will be given to patient during program as part of exercise prescription that the participant will acknowledge.   Education: Aerobic Exercise & Resistance Training: - Gives group verbal and written instruction on the various components of exercise. Focuses on aerobic and resistive training programs and the benefits of this training and how to safely  progress through these programs..   Education: Exercise & Equipment Safety: - Individual verbal instruction and demonstration of equipment use and safety with use of the equipment.   Cardiac Rehab from 02/26/2020 in [facility] Cardiac and Pulmonary Rehab  Date 02/26/20  Educator [facility]  Instruction Review Code 1- Verbalizes Understanding      Education: Exercise Physiology & General Exercise Guidelines: - Group verbal and written instruction with models to review the exercise physiology of the cardiovascular system and associated critical values. Provides general exercise guidelines with specific guidelines to those with heart or lung disease.    Education: Flexibility, Balance, Mind/Body Relaxation: Provides group verbal/written instruction on the benefits of flexibility and  balance training, including mind/body exercise modes such as yoga, pilates and tai chi.  Demonstration and skill practice provided.   Activity Barriers & Risk Stratification:  Activity Barriers & Cardiac Risk Stratification - 02/28/20 1242      Activity Barriers & Cardiac Risk Stratification   Activity Barriers Deconditioning;Muscular Weakness;Shortness of Breath    Cardiac Risk Stratification High           6 Minute Walk:  6 Minute Walk    Row Name 02/28/20 1241         6 Minute Walk   Phase Initial     Distance 1415 feet     Walk Time 6 minutes     # of Rest Breaks 0     MPH 2.68     METS 4.33     RPE 11     Perceived Dyspnea  2     VO2 Peak 15.14     Symptoms Yes (comment)     Comments SOB     Resting HR 89 bpm     Resting BP 136/74     Resting Oxygen Saturation  99 %     Exercise Oxygen Saturation  during 6 min walk 98 %     Max Ex. HR 117 bpm     Max Ex. BP 166/76     2 Minute Post BP 144/74            Oxygen Initial Assessment:   Oxygen Re-Evaluation:   Oxygen Discharge (Final Oxygen Re-Evaluation):   Initial Exercise Prescription:  Initial Exercise Prescription - 02/28/20 1200      Date of Initial Exercise RX and Referring Provider   Date 02/28/20    Referring Provider Ida Rogue MD      Treadmill   MPH 2.6    Grade 1    Minutes 15    METs 3.35      Recumbant Bike   Level 4    RPM 50    Watts 59    Minutes 15    METs 3      NuStep   Level 4    SPM 80    Minutes 15    METs 3      Recumbant Elliptical   Level 2    RPM 50    Minutes 15    METs 3      Prescription Details   Frequency (times per week) 3    Duration Progress to 30 minutes of continuous aerobic without signs/symptoms of physical distress      Intensity   THRR 40-80% of Max Heartrate 121-183    Ratings of Perceived Exertion 11-13    Perceived Dyspnea 0-4      Progression   Progression Continue to progress workloads to maintain intensity without  signs/symptoms  of physical distress.      Resistance Training   Training Prescription Yes    Weight 3 lb    Reps 10-15           Perform Capillary Blood Glucose checks as needed.  Exercise Prescription Changes:  Exercise Prescription Changes    Row Name 02/28/20 1200 03/06/20 1300 03/19/20 1100 03/21/20 1100 04/01/20 1500     Response to Exercise   Blood Pressure (Admit) 134/74 130/78 140/88 -- 130/86   Blood Pressure (Exercise) 166/76 162/84 144/72 -- 124/66   Blood Pressure (Exit) 144/74 124/78 156/88 -- 122/76   Heart Rate (Admit) 89 bpm 85 bpm 81 bpm -- 94 bpm   Heart Rate (Exercise) 117 bpm 123 bpm 117 bpm -- 125 bpm   Heart Rate (Exit) 90 bpm 99 bpm 102 bpm -- 103 bpm   Oxygen Saturation (Admit) 99 % -- -- -- --   Oxygen Saturation (Exercise) 98 % -- -- -- --   Rating of Perceived Exertion (Exercise) 11 15 15  -- 16   Perceived Dyspnea (Exercise) 2 -- -- -- --   Symptoms SOB -- none -- none   Comments walk test results first day -- -- --   Duration -- -- Continue with 30 min of aerobic exercise without signs/symptoms of physical distress. -- Continue with 30 min of aerobic exercise without signs/symptoms of physical distress.   Intensity -- -- THRR unchanged -- THRR unchanged     Progression   Progression -- -- Continue to progress workloads to maintain intensity without signs/symptoms of physical distress. -- Continue to progress workloads to maintain intensity without signs/symptoms of physical distress.   Average METs -- -- 2.68 -- 3.36     Resistance Training   Training Prescription -- -- Yes -- Yes   Weight -- -- 3 lb -- 3 lb   Reps -- -- 10-15 -- 10-15     Interval Training   Interval Training -- -- No -- No     Treadmill   MPH -- -- 2.6 -- 2.6   Grade -- -- 1 -- 1   Minutes -- -- 15 -- 15   METs -- -- 3.35 -- 3.35     Recumbant Bike   Level -- -- 4 -- 4.5   Watts -- -- -- -- 15   Minutes -- -- 15 -- 4.31     NuStep   Level -- -- 4 -- 5   Minutes  -- -- 15 -- 15   METs -- -- 2.6 -- 3.6     Recumbant Elliptical   Level -- -- 2.5 -- 2.5   Minutes -- -- 15 -- 15   METs -- -- 2.1 -- 2.2     Home Exercise Plan   Plans to continue exercise at -- -- -- Home (comment)  walking and staff videos --   Frequency -- -- -- Add 2 additional days to program exercise sessions. --   Initial Home Exercises Provided -- -- -- 03/21/20 --   [place] Name 04/14/20 1500 04/29/20 1600           Response to Exercise   Blood Pressure (Admit) 126/64 128/70      Blood Pressure (Exercise) 152/64 160/78      Blood Pressure (Exit) 124/64 124/70      Heart Rate (Admit) 82 bpm 95 bpm      Heart Rate (Exercise) 115 bpm 111 bpm      Heart Rate (Exit) 81 bpm  88 bpm      Rating of Perceived Exertion (Exercise) 15 14      Symptoms none none      Duration Continue with 30 min of aerobic exercise without signs/symptoms of physical distress. Continue with 30 min of aerobic exercise without signs/symptoms of physical distress.      Intensity THRR unchanged THRR unchanged        Progression   Progression Continue to progress workloads to maintain intensity without signs/symptoms of physical distress. Continue to progress workloads to maintain intensity without signs/symptoms of physical distress.      Average METs 3.85 3.9        Resistance Training   Training Prescription Yes Yes      Weight 3 lb 5 lb      Reps 10-15 10-15        Interval Training   Interval Training No No        Treadmill   MPH 2.8 --      Grade 3 --      Minutes 15 --      METs 4.3 --        Recumbant Bike   Level 4.5 4.5      Minutes 15 15      METs 4.9 4.32        NuStep   Level 5 5      SPM -- 80      Minutes 15 15      METs 4 3.5        Recumbant Elliptical   Level 3 --      Minutes 15 --      METs 2.2 --        Home Exercise Plan   Plans to continue exercise at Home (comment)  walking and staff videos --      Frequency Add 2 additional days to program exercise  sessions. --      Initial Home Exercises Provided 03/21/20 --             Exercise Comments:  Exercise Comments    Row Name 03/04/20 1148           Exercise Comments First full day of exercise!  Patient was oriented to gym and equipment including functions, settings, policies, and procedures.  Patient's individual exercise prescription and treatment plan were reviewed.  All starting workloads were established based on the results of the 6 minute walk test done at initial orientation visit.  The plan for exercise progression was also introduced and progression will be customized based on patient's performance and goals.              Exercise Goals and Review:  Exercise Goals    Row Name 02/28/20 1244             Exercise Goals   Increase Physical Activity Yes       Intervention Provide advice, education, support and counseling about physical activity/exercise needs.;Develop an individualized exercise prescription for aerobic and resistive training based on initial evaluation findings, risk stratification, comorbidities and participant's personal goals.       Expected Outcomes Short Term: Attend rehab on a regular basis to increase amount of physical activity.;Long Term: Add in home exercise to make exercise part of routine and to increase amount of physical activity.;Long Term: Exercising regularly at least 3-5 days a week.       Increase Strength and Stamina Yes       Intervention Provide advice,  education, support and counseling about physical activity/exercise needs.;Develop an individualized exercise prescription for aerobic and resistive training based on initial evaluation findings, risk stratification, comorbidities and participant's personal goals.       Expected Outcomes Short Term: Increase workloads from initial exercise prescription for resistance, speed, and METs.;Long Term: Improve cardiorespiratory fitness, muscular endurance and strength as measured by increased METs  and functional capacity (6MWT);Short Term: Perform resistance training exercises routinely during rehab and add in resistance training at home       Able to understand and use rate of perceived exertion (RPE) scale Yes       Intervention Provide education and explanation on how to use RPE scale       Expected Outcomes Short Term: Able to use RPE daily in rehab to express subjective intensity level;Long Term:  Able to use RPE to guide intensity level when exercising independently       Able to understand and use Dyspnea scale Yes       Intervention Provide education and explanation on how to use Dyspnea scale       Expected Outcomes Short Term: Able to use Dyspnea scale daily in rehab to express subjective sense of shortness of breath during exertion;Long Term: Able to use Dyspnea scale to guide intensity level when exercising independently       Knowledge and understanding of Target Heart Rate Range (THRR) Yes       Intervention Provide education and explanation of THRR including how the numbers were predicted and where they are located for reference       Expected Outcomes Short Term: Able to state/look up THRR;Short Term: Able to use daily as guideline for intensity in rehab;Long Term: Able to use THRR to govern intensity when exercising independently       Able to check pulse independently Yes       Intervention Provide education and demonstration on how to check pulse in carotid and radial arteries.;Review the importance of being able to check your own pulse for safety during independent exercise       Expected Outcomes Short Term: Able to explain why pulse checking is important during independent exercise;Long Term: Able to check pulse independently and accurately       Understanding of Exercise Prescription Yes       Intervention Provide education, explanation, and written materials on patient's individual exercise prescription       Expected Outcomes Short Term: Able to explain program exercise  prescription;Long Term: Able to explain home exercise prescription to exercise independently              Exercise Goals Re-Evaluation :  Exercise Goals Re-Evaluation    Row Name 03/04/20 1148 03/19/20 1125 03/21/20 1154 04/01/20 1509 04/02/20 1141     Exercise Goal Re-Evaluation   Exercise Goals Review Able to understand and use rate of perceived exertion (RPE) scale;Knowledge and understanding of Target Heart Rate Range (THRR);Able to check pulse independently;Understanding of Exercise Prescription Increase Physical Activity;Increase Strength and Stamina;Understanding of Exercise Prescription Increase Physical Activity;Increase Strength and Stamina;Understanding of Exercise Prescription;Able to understand and use rate of perceived exertion (RPE) scale;Able to understand and use Dyspnea scale;Knowledge and understanding of Target Heart Rate Range (THRR);Able to check pulse independently Increase Physical Activity;Increase Strength and Stamina;Able to understand and use rate of perceived exertion (RPE) scale;Able to understand and use Dyspnea scale;Knowledge and understanding of Target Heart Rate Range (THRR);Able to check pulse independently;Understanding of Exercise Prescription Increase Physical Activity;Increase Strength and Stamina;Able  to understand and use rate of perceived exertion (RPE) scale;Able to understand and use Dyspnea scale;Knowledge and understanding of Target Heart Rate Range (THRR);Able to check pulse independently;Understanding of Exercise Prescription   Comments Reviewed RPE scale, THR and program prescription with pt today.  Pt voiced understanding and was given a copy of goals to take home. Shivangi is off to a great start in rehab.  She is already up to level 2.5 on the recumbent elliptical.  We will continue to monitor her progress. Reviewed home exercise with pt today.  Pt plans to walk and use staff videos at home for exercise.  Reviewed THR, pulse, RPE, sign and symptoms, NTG  use, and when to call 911 or MD.  Also discussed weather considerations and indoor options.  Pt voiced understanding. Meena is progressing well.  She finds walking easier than seated machines.  She does work in [employer] range Siriah is walking a mile on days she isnt at [facility] and sometimes in addition to HT.   Expected Outcomes Short: Use RPE daily to regulate intensity. Long: Follow program prescription in THR. Short: Continue to increase workloads  Long: Continue to follow program prescription. Short: Start to add in exercise  Long: Continue to improve stamina. Short:  continue to work in [employer] ranges Long: improve overall MET level Short:  continue to exercise consistently Long: improve overall stamina   Row Name 04/14/20 1508 04/25/20 1132 04/29/20 1613         Exercise Goal Re-Evaluation   Exercise Goals Review Increase Physical Activity;Increase Strength and Stamina;Understanding of Exercise Prescription Increase Physical Activity;Increase Strength and Stamina;Understanding of Exercise Prescription Increase Physical Activity;Increase Strength and Stamina;Understanding of Exercise Prescription     Comments Kataleah has been doing well in rehab.  She is now up to level 5 on the NuStep and 4.5 on the bike.  She has returned to work, but comes to exercise on an extended lunch hour.  We will continue to monitor her progress. Prerna is doing well in rehab.  She is still adjusting back to her 9 hour shift days.  She will slowly  start to add back in her walking at home.  She does stay busy and active at work.  Her strength and stamina are getting better and she is able to handle the long days again. She is still tired when she gets home. Raeghan is making steady progress in increasing MET levels.  She has also increased to 5 lb weights for strength training.     Expected Outcomes Short: Increase hand weights  Long: Continue to improve stamina. Short: Continue to add in walking  Long: Continue to improve stamina. Short:  continue to attend consistently Long: improve overall MET level            Discharge Exercise Prescription (Final Exercise Prescription Changes):  Exercise Prescription Changes - 04/29/20 1600      Response to Exercise   Blood Pressure (Admit) 128/70    Blood Pressure (Exercise) 160/78    Blood Pressure (Exit) 124/70    Heart Rate (Admit) 95 bpm    Heart Rate (Exercise) 111 bpm    Heart Rate (Exit) 88 bpm    Rating of Perceived Exertion (Exercise) 14    Symptoms none    Duration Continue with 30 min of aerobic exercise without signs/symptoms of physical distress.    Intensity THRR unchanged      Progression   Progression Continue to progress workloads to maintain intensity without signs/symptoms of physical  distress.    Average METs 3.9      Resistance Training   Training Prescription Yes    Weight 5 lb    Reps 10-15      Interval Training   Interval Training No      Recumbant Bike   Level 4.5    Minutes 15    METs 4.32      NuStep   Level 5    SPM 80    Minutes 15    METs 3.5           Nutrition:  Target Goals: Understanding of nutrition guidelines, daily intake of sodium <1520m, cholesterol <2018m calories 30% from fat and 7% or less from saturated fats, daily to have 5 or more servings of fruits and vegetables.  Education: Controlling Sodium/Reading Food Labels -Group verbal and written material supporting the discussion of sodium use in heart healthy nutrition. Review and explanation with models, verbal and written materials for utilization of the food label.   Education: General Nutrition Guidelines/Fats and Fiber: -Group instruction provided by verbal, written material, models and posters to present the general guidelines for heart healthy nutrition. Gives an explanation and review of dietary fats and fiber.   Biometrics:  Pre Biometrics - 02/28/20 1244      Pre Biometrics   Height 5' 3.1" (1.603 m)    Weight 178 lb 4.8 oz (80.9 kg)    BMI  (Calculated) 31.47    Single Leg Stand 19.66 seconds            Nutrition Therapy Plan and Nutrition Goals:  Nutrition Therapy & Goals - 03/18/20 1201      Nutrition Therapy   Diet low Na, HH    Drug/Food Interactions Statins/Certain Fruits    Protein (specify units) 65-70g    Fiber 25 grams    Whole Grain Foods 3 servings    Saturated Fats 12 max. grams    Fruits and Vegetables 5 servings/day    Sodium 1.5 grams      Personal Nutrition Goals   Nutrition Goal LT: increase stamina on exertion    Comments B: oatmeal L: varies D: pt reports not eating a lot; eating fried foods, not many vegetables. Motivation. Discussed HH eating, discussed planning and shopping on a budget. Pt would like to eat consistently, talked about healthy things to eat on hand; pt reports making bean salad and pasta salad.      Intervention Plan   Intervention Prescribe, educate and counsel regarding individualized specific dietary modifications aiming towards targeted core components such as weight, hypertension, lipid management, diabetes, heart failure and other comorbidities.;Nutrition handout(s) given to patient.    Expected Outcomes Short Term Goal: Understand basic principles of dietary content, such as calories, fat, sodium, cholesterol and nutrients.;Short Term Goal: A plan has been developed with personal nutrition goals set during dietitian appointment.;Long Term Goal: Adherence to prescribed nutrition plan.           Nutrition Assessments:  Nutrition Assessments - 02/28/20 1329      MEDFICTS Scores   Pre Score 25           MEDIFICTS Score Key:          ?70 Need to make dietary changes          40-70 Heart Healthy Diet         ? 40 Therapeutic Level Cholesterol Diet  Nutrition Goals Re-Evaluation:  Nutrition Goals Re-Evaluation    [place] 04/09/20 1136  Goals   Nutrition Goal ST: LT: increase stamina on exertion       Comment Nutrition has been going well.  Preparring bean salad              Nutrition Goals Discharge (Final Nutrition Goals Re-Evaluation):  Nutrition Goals Re-Evaluation - 04/09/20 1136      Goals   Nutrition Goal ST: LT: increase stamina on exertion    Comment Nutrition has been going well. Preparring bean salad           Psychosocial: Target Goals: Acknowledge presence or absence of significant depression and/or stress, maximize coping skills, provide positive support system. Participant is able to verbalize types and ability to use techniques and skills needed for reducing stress and depression.   Education: Depression - Provides group verbal and written instruction on the correlation between heart/lung disease and depressed mood, treatment options, and the stigmas associated with seeking treatment.   Education: Sleep Hygiene -Provides group verbal and written instruction about how sleep can affect your health.  Define sleep hygiene, discuss sleep cycles and impact of sleep habits. Review good sleep hygiene tips.     Education: Stress and Anxiety: - Provides group verbal and written instruction about the health risks of elevated stress and causes of high stress.  Discuss the correlation between heart/lung disease and anxiety and treatment options. Review healthy ways to manage with stress and anxiety.    Initial Review & Psychosocial Screening:  Initial Psych Review & Screening - 02/26/20 1255      Initial Review   Current issues with None Identified      Family Dynamics   Good Support System? Yes    Comments She can look to her husband, son, brother, sisterin law, sister and co workers for support. She is being positive about improving her health and moving on with her life.      Barriers   Psychosocial barriers to participate in program There are no identifiable barriers or psychosocial needs.;The patient should benefit from training in stress management and relaxation.      Screening Interventions    Interventions Encouraged to exercise;Provide feedback about the scores to participant;To provide support and resources with identified psychosocial needs    Expected Outcomes Short Term goal: Utilizing psychosocial counselor, staff and physician to assist with identification of specific Stressors or current issues interfering with healing process. Setting desired goal for each stressor or current issue identified.;Long Term Goal: Stressors or current issues are controlled or eliminated.;Short Term goal: Identification and review with participant of any Quality of Life or Depression concerns found by scoring the questionnaire.;Long Term goal: The participant improves quality of Life and PHQ9 Scores as seen by post scores and/or verbalization of changes           Quality of Life Scores:   Quality of Life - 02/28/20 1328      Quality of Life   Select Quality of Life      Quality of Life Scores   Health/Function Pre 26.13 %    Socioeconomic Pre 30 %    Psych/Spiritual Pre 27.21 %    Family Pre 28.8 %    GLOBAL Pre 27.54 %          Scores of 19 and below usually indicate a poorer quality of life in these areas.  A difference of  2-3 points is a clinically meaningful difference.  A difference of 2-3 points in the total score of the Quality of Life Index has  been associated with significant improvement in overall quality of life, self-image, physical symptoms, and general health in studies assessing change in quality of life.  PHQ-9: Recent Review Flowsheet Data    Depression screen [facility] 2/9 02/28/2020   Decreased Interest 0   Down, Depressed, Hopeless 0   PHQ - 2 Score 0   Altered sleeping 0   Tired, decreased energy 1   Change in appetite 0   Feeling bad or failure about yourself  0   Trouble concentrating 0   Moving slowly or fidgety/restless 0   Suicidal thoughts 0   PHQ-9 Score 1   Difficult doing work/chores Not difficult at all     Interpretation of Total Score  Total Score  Depression Severity:  1-4 = Minimal depression, 5-9 = Mild depression, 10-14 = Moderate depression, 15-19 = Moderately severe depression, 20-27 = Severe depression   Psychosocial Evaluation and Intervention:  Psychosocial Evaluation - 02/26/20 1259      Psychosocial Evaluation & Interventions   Interventions Encouraged to exercise with the program and follow exercise prescription    Comments She can look to her husband, son, brother, sisterin law, sister and co workers for support. She is being positive about improving her health and moving on with her life.    Expected Outcomes Short: Attend [facility] stress management education to decrease stress. Long: Maintain exercise Post [facility] to keep stress at a minimum.    Continue Psychosocial Services  Follow up required by staff           Psychosocial Re-Evaluation:  Psychosocial Re-Evaluation    [place] Name 04/02/20 1139 04/25/20 1134           Psychosocial Re-Evaluation   Current issues with Current Stress Concerns;Current Sleep Concerns Current Stress Concerns;Current Sleep Concerns      Comments Tyonna sleeps well.  She is concerned about going back to work.  They are allowing her to come to [facility] on her lunch break.  She has good support fro [facility], family and friends. Elizabelle is doing well.  She is adjusting to being back at work.  She starting to get the hang of her long hours, but they are getting better. She continues to sleep well.  She has started Chantix and her sleep has gotten a little more disruptive with her tossing and turning.      Expected Outcomes Short:  lean on support system when needed Long:  maintain positive outlook Short: Continue to adjust to work schedule, quit smoking Long: Continue to stay positive.      Interventions -- Encouraged to attend Cardiac Rehabilitation for the exercise      Continue Psychosocial Services  -- Follow up required by staff             Psychosocial Discharge (Final Psychosocial  Re-Evaluation):  Psychosocial Re-Evaluation - 04/25/20 1134      Psychosocial Re-Evaluation   Current issues with Current Stress Concerns;Current Sleep Concerns    Comments Lalaine is doing well.  She is adjusting to being back at work.  She starting to get the hang of her long hours, but they are getting better. She continues to sleep well.  She has started Chantix and her sleep has gotten a little more disruptive with her tossing and turning.    Expected Outcomes Short: Continue to adjust to work schedule, quit smoking Long: Continue to stay positive.    Interventions Encouraged to attend Cardiac Rehabilitation for the exercise    Continue Psychosocial Services  Follow up required by staff           Vocational Rehabilitation: Provide vocational rehab assistance to qualifying candidates.   Vocational Rehab Evaluation & Intervention:   Education: Education Goals: Education classes will be provided on a variety of topics geared toward better understanding of heart health and risk factor modification. Participant will state understanding/return demonstration of topics presented as noted by education test scores.  Learning Barriers/Preferences:  Learning Barriers/Preferences - 02/26/20 1300      Learning Barriers/Preferences   Learning Barriers Sight   wears glasses   Learning Preferences None           General Cardiac Education Topics:  AED/CPR: - Group verbal and written instruction with the use of models to demonstrate the basic use of the AED with the basic ABC's of resuscitation.   Anatomy & Physiology of the Heart: - Group verbal and written instruction and models provide basic cardiac anatomy and physiology, with the coronary electrical and arterial systems. Review of Valvular disease and Heart Failure   Cardiac Procedures: - Group verbal and written instruction to review commonly prescribed medications for heart disease. Reviews the medication, class of the drug, and  side effects. Includes the steps to properly store meds and maintain the prescription regimen. (beta blockers and nitrates)   Cardiac Medications I: - Group verbal and written instruction to review commonly prescribed medications for heart disease. Reviews the medication, class of the drug, and side effects. Includes the steps to properly store meds and maintain the prescription regimen.   Cardiac Medications II: -Group verbal and written instruction to review commonly prescribed medications for heart disease. Reviews the medication, class of the drug, and side effects. (all other drug classes)    Go Sex-Intimacy & Heart Disease, Get SMART - Goal Setting: - Group verbal and written instruction through game format to discuss heart disease and the return to sexual intimacy. Provides group verbal and written material to discuss and apply goal setting through the application of the S.M.A.R.T. Method.   Other Matters of the Heart: - Provides group verbal, written materials and models to describe Stable Angina and Peripheral Artery. Includes description of the disease process and treatment options available to the cardiac patient.   Infection Prevention: - Provides verbal and written material to individual with discussion of infection control including proper hand washing and proper equipment cleaning during exercise session.   Cardiac Rehab from 02/26/2020 in [facility] Cardiac and Pulmonary Rehab  Date 02/26/20  Educator [facility]  Instruction Review Code 1- Verbalizes Understanding      Falls Prevention: - Provides verbal and written material to individual with discussion of falls prevention and safety.   Cardiac Rehab from 02/26/2020 in [facility] Cardiac and Pulmonary Rehab  Date 02/26/20  Educator [facility]  Instruction Review Code 1- Verbalizes Understanding      Other: -Provides group and verbal instruction on various topics (see comments)   Knowledge Questionnaire Score:  Knowledge Questionnaire  Score - 02/28/20 1329      Knowledge Questionnaire Score   Pre Score 24/26 Education Focus: Angina, Exercise           Core Components/Risk Factors/Patient Goals at Admission:  Personal Goals and Risk Factors at Admission - 02/28/20 1245      Core Components/Risk Factors/Patient Goals on Admission    Weight Management Yes;Weight Loss;Obesity    Intervention Weight Management: Develop a combined nutrition and exercise program designed to reach desired caloric intake, while maintaining appropriate intake of nutrient and  fiber, sodium and fats, and appropriate energy expenditure required for the weight goal.;Weight Management: Provide education and appropriate resources to help participant work on and attain dietary goals.;Weight Management/Obesity: Establish reasonable short term and long term weight goals.;Obesity: Provide education and appropriate resources to help participant work on and attain dietary goals.    Admit Weight 178 lb 4.8 oz (80.9 kg)    Goal Weight: Short Term 173 lb (78.5 kg)    Goal Weight: Long Term 168 lb (76.2 kg)    Expected Outcomes Short Term: Continue to assess and modify interventions until short term weight is achieved;Long Term: Adherence to nutrition and physical activity/exercise program aimed toward attainment of established weight goal;Weight Loss: Understanding of general recommendations for a balanced deficit meal plan, which promotes 1-2 lb weight loss per week and includes a negative energy balance of [phone] kcal/d;Understanding recommendations for meals to include 15-35% energy as protein, 25-35% energy from fat, 35-60% energy from carbohydrates, less than 259m of dietary cholesterol, 20-35 gm of total fiber daily;Understanding of distribution of calorie intake throughout the day with the consumption of 4-5 meals/snacks    Tobacco Cessation Yes    Number of packs per day 2 cigs per day    Intervention Assist the participant in steps to quit. Provide  individualized education and counseling about committing to Tobacco Cessation, relapse prevention, and pharmacological support that can be provided by physician.;OAdvice worker assist with locating and accessing local/national Quit Smoking programs, and support quit date choice.    Expected Outcomes Short Term: Will demonstrate readiness to quit, by selecting a quit date.;Short Term: Will quit all tobacco product use, adhering to prevention of relapse plan.;Long Term: Complete abstinence from all tobacco products for at least 12 months from quit date.    Hypertension Yes    Intervention Provide education on lifestyle modifcations including regular physical activity/exercise, weight management, moderate sodium restriction and increased consumption of fresh fruit, vegetables, and low fat dairy, alcohol moderation, and smoking cessation.;Monitor prescription use compliance.    Expected Outcomes Short Term: Continued assessment and intervention until BP is < 140/945mHG in hypertensive participants. < 130/804mG in hypertensive participants with diabetes, heart failure or chronic kidney disease.;Long Term: Maintenance of blood pressure at goal levels.    Lipids Yes    Intervention Provide education and support for participant on nutrition & aerobic/resistive exercise along with prescribed medications to achieve LDL <85m38mDL >40mg25m Expected Outcomes Long Term: Cholesterol controlled with medications as prescribed, with individualized exercise RX and with personalized nutrition plan. Value goals: LDL < 85mg,31m > 40 mg.;Short Term: Participant states understanding of desired cholesterol values and is compliant with medications prescribed. Participant is following exercise prescription and nutrition guidelines.           Education:Diabetes - Individual verbal and written instruction to review signs/symptoms of diabetes, desired ranges of glucose level fasting, after meals and with  exercise. Acknowledge that pre and post exercise glucose checks will be done for 3 sessions at entry of program.   Education: Know Your Numbers and Risk Factors: -Group verbal and written instruction about important numbers in your health.  Discussion of what are risk factors and how they play a role in the disease process.  Review of Cholesterol, Blood Pressure, Diabetes, and BMI and the role they play in your overall health.   Core Components/Risk Factors/Patient Goals Review:   Goals and Risk Factor Review    Row Name 04/02/20 1137 04/25/20 1136  Core Components/Risk Factors/Patient Goals Review   Personal Goals Review Weight Management/Obesity;Hypertension;Lipids;Tobacco Cessation Weight Management/Obesity;Hypertension;Lipids;Tobacco Cessation      Review Verta is taking meds as directed.  She sees her Dr Monday and plans to ask about the patch or other options.  She goes back to work Tuesday.  She does check BP at home or the [employer]. Dezarae is now on Chantix and she has dropped to 7 cigs/day.  She is not ready to set a quit date, but wants to just let it happen on its own.  She is in the mindset now to quit.  Her weight is staying steady so we talked about getting in more exercise.  She is trying to do better with her diet and healthier options for snacks.  Her blood pressures are doing well and she is still checking them at home.      Expected Outcomes Short:  follow up with Dr about smoking Long:  quit smoking Short: Continue work towards quitting smoking  Long: Continue to monitor risk factors.             Core Components/Risk Factors/Patient Goals at Discharge (Final Review):   Goals and Risk Factor Review - 04/25/20 1136      Core Components/Risk Factors/Patient Goals Review   Personal Goals Review Weight Management/Obesity;Hypertension;Lipids;Tobacco Cessation    Review Chamika is now on Chantix and she has dropped to 7 cigs/day.  She is not ready to set a  quit date, but wants to just let it happen on its own.  She is in the mindset now to quit.  Her weight is staying steady so we talked about getting in more exercise.  She is trying to do better with her diet and healthier options for snacks.  Her blood pressures are doing well and she is still checking them at home.    Expected Outcomes Short: Continue work towards quitting smoking  Long: Continue to monitor risk factors.           ITP Comments:  ITP Comments    Row Name 02/26/20 1305 02/28/20 1241 03/04/20 1148 03/18/20 1223 03/19/20 0520   ITP Comments Virtual Orientation performed. Patient informed when to come in for RD and EP orientation. Diagnosis can be found in [facility] 01/16/2020. Completed 6MWT and gym orientation.  Initial ITP created and sent for review to Dr. Caryl Comes covering for Dr. Emily Filbert, Medical Director. First full day of exercise!  Patient was oriented to gym and equipment including functions, settings, policies, and procedures.  Patient's individual exercise prescription and treatment plan were reviewed.  All starting workloads were established based on the results of the 6 minute walk test done at initial orientation visit.  The plan for exercise progression was also introduced and progression will be customized based on patient's performance and goals. Completed Initial RD Eval 30 Day review completed. Medical Director review done, changes made as directed,and approval shown by signature of Market researcher.   [place] Name 04/16/20 0539 05/14/20 0540         ITP Comments 30 Day review completed. ITP review done, changes made as directed,and approval shown by signature of  Scientist, research (life sciences). 30 Day review completed. Medical Director ITP review done, changes made as directed, and signed approval by Medical Director.             Comments: 30 Day review completed. Medical Director ITP review done, changes made as directed, and signed approval by Medical Director.

## 2020-05-16 ENCOUNTER — Encounter: Payer: BC Managed Care – PPO | Admitting: *Deleted

## 2020-05-16 ENCOUNTER — Other Ambulatory Visit: Payer: Self-pay

## 2020-05-16 DIAGNOSIS — Z951 Presence of aortocoronary bypass graft: Secondary | ICD-10-CM | POA: Diagnosis not present

## 2020-05-16 DIAGNOSIS — I252 Old myocardial infarction: Secondary | ICD-10-CM | POA: Diagnosis not present

## 2020-05-16 NOTE — Progress Notes (Signed)
Daily Session Note  Patient Details  Name: Victoria Avery MRN: [id number] Date of Birth: Jan 05, 1966 Referring Provider:     Cardiac Rehab from 02/28/2020 in [facility] Cardiac and Pulmonary Rehab  Referring Provider Ida Rogue MD      Encounter Date: 05/16/2020  Check In:  Session Check In - 05/16/20 1112      Check-In   Supervising physician immediately available to respond to emergencies See telemetry face sheet for immediately available ER MD    Location [facility]-Cardiac & Pulmonary Rehab    Staff Present Renita Papa, RN BSN;Joseph [address] [place], [place], RCEP, CCRP, CCET    Virtual Visit No    Medication changes reported     No    Fall or balance concerns reported    No    Tobacco Cessation No Change    Current number of cigarettes/nicotine per day     9    Warm-up and Cool-down Performed on first and last piece of equipment    Resistance Training Performed Yes    VAD Patient? No    PAD/SET Patient? No      Pain Assessment   Currently in Pain? No/denies              Social History   Tobacco Use  Smoking Status Current Every Day Smoker  . Packs/day: 0.25  . Years: 30.00  . Pack years: 7.50  . Types: Cigarettes  Smokeless Tobacco Never Used    Goals Met:  Independence with exercise equipment Exercise tolerated well No report of cardiac concerns or symptoms Strength training completed today  Goals Unmet:  Not Applicable  Comments: Pt able to follow exercise prescription today without complaint.  Will continue to monitor for progression.    Dr. Emily Filbert is Medical Director for [place] and [name].

## 2020-05-19 ENCOUNTER — Other Ambulatory Visit: Payer: Self-pay

## 2020-05-19 ENCOUNTER — Encounter: Payer: BC Managed Care – PPO | Admitting: *Deleted

## 2020-05-19 DIAGNOSIS — Z951 Presence of aortocoronary bypass graft: Secondary | ICD-10-CM | POA: Diagnosis not present

## 2020-05-19 DIAGNOSIS — I252 Old myocardial infarction: Secondary | ICD-10-CM | POA: Diagnosis not present

## 2020-05-19 NOTE — Progress Notes (Signed)
Daily Session Note  Patient Details  Name: Victoria Avery MRN: [id number] Date of Birth: 1966-01-14 Referring Provider:     Cardiac Rehab from 02/28/2020 in [facility] Cardiac and Pulmonary Rehab  Referring Provider Ida Rogue MD      Encounter Date: 05/19/2020  Check In:  Session Check In - 05/19/20 1116      Check-In   Supervising physician immediately available to respond to emergencies See telemetry face sheet for immediately available ER MD    Location [facility]-Cardiac & Pulmonary Rehab    Staff Present Renita Papa, RN Moises Blood, BS, ACSM CEP, Exercise Physiologist;Amanda Oletta Darter, [place], ACSM CEP, Exercise Physiologist    Virtual Visit No    Medication changes reported     No    Fall or balance concerns reported    No    Warm-up and Cool-down Performed on first and last piece of equipment    Resistance Training Performed Yes    VAD Patient? No    PAD/SET Patient? No      Pain Assessment   Currently in Pain? No/denies              Social History   Tobacco Use  Smoking Status Current Every Day Smoker  . Packs/day: 0.25  . Years: 30.00  . Pack years: 7.50  . Types: Cigarettes  Smokeless Tobacco Never Used    Goals Met:  Independence with exercise equipment Exercise tolerated well Personal goals reviewed No report of cardiac concerns or symptoms Strength training completed today  Goals Unmet:  Not Applicable  Comments: Pt able to follow exercise prescription today without complaint.  Will continue to monitor for progression.    Dr. Emily Filbert is Medical Director for [place] and [name].

## 2020-05-21 ENCOUNTER — Encounter: Payer: BC Managed Care – PPO | Admitting: *Deleted

## 2020-05-21 ENCOUNTER — Other Ambulatory Visit: Payer: Self-pay

## 2020-05-21 DIAGNOSIS — Z951 Presence of aortocoronary bypass graft: Secondary | ICD-10-CM | POA: Diagnosis not present

## 2020-05-21 DIAGNOSIS — I252 Old myocardial infarction: Secondary | ICD-10-CM | POA: Diagnosis not present

## 2020-05-21 NOTE — Progress Notes (Signed)
Daily Session Note  Patient Details  Name: Victoria Avery MRN: [id number] Date of Birth: Aug 31, 1966 Referring Provider:     Cardiac Rehab from 02/28/2020 in [facility] Cardiac and Pulmonary Rehab  Referring Provider Ida Rogue MD      Encounter Date: 05/21/2020  Check In:  Session Check In - 05/21/20 1124      Check-In   Supervising physician immediately available to respond to emergencies See telemetry face sheet for immediately available ER MD    Location [facility]-Cardiac & Pulmonary Rehab    Staff Present Renita Papa, RN BSN;Melissa Caiola RDN, Rowe Pavy, BA, ACSM CEP, Exercise Physiologist    Virtual Visit No    Medication changes reported     No    Fall or balance concerns reported    No    Tobacco Cessation No Change    Warm-up and Cool-down Performed on first and last piece of equipment    Resistance Training Performed Yes    VAD Patient? No    PAD/SET Patient? No      Pain Assessment   Currently in Pain? No/denies              Social History   Tobacco Use  Smoking Status Current Every Day Smoker  . Packs/day: 0.25  . Years: 30.00  . Pack years: 7.50  . Types: Cigarettes  Smokeless Tobacco Never Used    Goals Met:  Independence with exercise equipment Exercise tolerated well No report of cardiac concerns or symptoms Strength training completed today  Goals Unmet:  Not Applicable  Comments: Pt able to follow exercise prescription today without complaint.  Will continue to monitor for progression.    Dr. Emily Filbert is Medical Director for [place] and [name].

## 2020-05-23 ENCOUNTER — Other Ambulatory Visit: Payer: Self-pay

## 2020-05-23 ENCOUNTER — Encounter: Payer: BC Managed Care – PPO | Admitting: *Deleted

## 2020-05-23 DIAGNOSIS — I252 Old myocardial infarction: Secondary | ICD-10-CM | POA: Diagnosis not present

## 2020-05-23 DIAGNOSIS — Z951 Presence of aortocoronary bypass graft: Secondary | ICD-10-CM

## 2020-05-23 NOTE — Progress Notes (Signed)
Daily Session Note  Patient Details  Name: Victoria Avery MRN: [id number] Date of Birth: 01-24-1966 Referring Provider:     Cardiac Rehab from 02/28/2020 in [facility] Cardiac and Pulmonary Rehab  Referring Provider Victoria Rogue MD      Encounter Date: 05/23/2020  Check In:  Session Check In - 05/23/20 1141      Check-In   Supervising physician immediately available to respond to emergencies See telemetry face sheet for immediately available ER MD    Location [facility]-Cardiac & Pulmonary Rehab    Staff Present Nyoka Cowden, RN, BSN, MA;Meredith Sherryll Burger, RN BSN    Virtual Visit No    Medication changes reported     No    Fall or balance concerns reported    No    Tobacco Cessation No Change    Warm-up and Cool-down Performed on first and last piece of equipment    Resistance Training Performed Yes    VAD Patient? No    PAD/SET Patient? No      Pain Assessment   Currently in Pain? No/denies              Social History   Tobacco Use  Smoking Status Current Every Day Smoker  . Packs/day: 0.25  . Years: 30.00  . Pack years: 7.50  . Types: Cigarettes  Smokeless Tobacco Never Used    Goals Met:  Independence with exercise equipment Exercise tolerated well No report of cardiac concerns or symptoms Strength training completed today  Goals Unmet:  Not Applicable  Comments: Pt able to follow exercise prescription today without complaint.  Will continue to monitor for progression.   Dr. Emily Filbert is Medical Director for [name] and [name].

## 2020-05-26 ENCOUNTER — Encounter: Payer: BC Managed Care – PPO | Admitting: *Deleted

## 2020-05-26 ENCOUNTER — Other Ambulatory Visit: Payer: Self-pay

## 2020-05-26 DIAGNOSIS — Z951 Presence of aortocoronary bypass graft: Secondary | ICD-10-CM

## 2020-05-26 DIAGNOSIS — I252 Old myocardial infarction: Secondary | ICD-10-CM | POA: Diagnosis not present

## 2020-05-26 NOTE — Progress Notes (Signed)
Daily Session Note  Patient Details  Name: TAMEY WANEK MRN: [id number] Date of Birth: Aug 25, 1966 Referring Provider:     Cardiac Rehab from 02/28/2020 in [facility] Cardiac and Pulmonary Rehab  Referring Provider Ida Rogue MD      Encounter Date: 05/26/2020  Check In:  Session Check In - 05/26/20 1118      Check-In   Supervising physician immediately available to respond to emergencies See telemetry face sheet for immediately available ER MD    Location [facility]-Cardiac & Pulmonary Rehab    Staff Present Renita Papa, RN BSN;Joseph [address] [place], [place], [place], CCRP, [place], [place], ACSM CEP, Exercise Physiologist    Virtual Visit No    Medication changes reported     No    Fall or balance concerns reported    No    Warm-up and Cool-down Performed on first and last piece of equipment    Resistance Training Performed Yes    VAD Patient? No    PAD/SET Patient? No      Pain Assessment   Currently in Pain? No/denies              Social History   Tobacco Use  Smoking Status Current Every Day Smoker  . Packs/day: 0.25  . Years: 30.00  . Pack years: 7.50  . Types: Cigarettes  Smokeless Tobacco Never Used    Goals Met:  Independence with exercise equipment Exercise tolerated well No report of cardiac concerns or symptoms Strength training completed today  Goals Unmet:  Not Applicable  Comments: Pt able to follow exercise prescription today without complaint.  Will continue to monitor for progression.    Dr. Emily Filbert is Medical Director for [place] and [name].

## 2020-05-28 ENCOUNTER — Other Ambulatory Visit: Payer: Self-pay

## 2020-05-28 DIAGNOSIS — Z951 Presence of aortocoronary bypass graft: Secondary | ICD-10-CM

## 2020-05-28 DIAGNOSIS — I252 Old myocardial infarction: Secondary | ICD-10-CM | POA: Diagnosis not present

## 2020-05-28 NOTE — Progress Notes (Signed)
Daily Session Note  Patient Details  Name: Victoria Avery MRN: [id number] Date of Birth: [birth year] Referring Provider:     Cardiac Rehab from 02/28/2020 in [facility] Cardiac and Pulmonary Rehab  Referring Provider Ida Rogue MD      Encounter Date: 05/28/2020  Check In:  Session Check In - 05/28/20 1156      Check-In   Supervising physician immediately available to respond to emergencies See telemetry face sheet for immediately available ER MD    Location [facility]-Cardiac & Pulmonary Rehab    Staff Present Carson Myrtle, BS, RRT, CPFT;Joseph Hood RCP,RRT,BSRT;Vida Rigger RN, BSN;Jessica Luan Pulling, MA, RCEP, CCRP, Marylynn Pearson, MS Exercise Physiologist;Amanda Oletta Darter, [place], ACSM CEP, Exercise Physiologist    Virtual Visit No    Medication changes reported     No    Fall or balance concerns reported    No    Warm-up and Cool-down Performed on first and last piece of equipment    Resistance Training Performed Yes    VAD Patient? No    PAD/SET Patient? No      Pain Assessment   Currently in Pain? No/denies              Social History   Tobacco Use  Smoking Status Current Every Day Smoker  . Packs/day: 0.25  . Years: 30.00  . Pack years: 7.50  . Types: Cigarettes  Smokeless Tobacco Never Used    Goals Met:  Independence with exercise equipment Exercise tolerated well No report of cardiac concerns or symptoms Strength training completed today  Goals Unmet:  Not Applicable  Comments: Pt able to follow exercise prescription today without complaint.  Will continue to monitor for progression.   Dr. Emily Filbert is Medical Director for [place] and [name].

## 2020-05-30 ENCOUNTER — Encounter: Payer: BC Managed Care – PPO | Attending: Cardiovascular Disease | Admitting: *Deleted

## 2020-05-30 ENCOUNTER — Other Ambulatory Visit: Payer: Self-pay

## 2020-05-30 DIAGNOSIS — Z951 Presence of aortocoronary bypass graft: Secondary | ICD-10-CM | POA: Diagnosis not present

## 2020-05-30 DIAGNOSIS — I252 Old myocardial infarction: Secondary | ICD-10-CM | POA: Insufficient documentation

## 2020-05-30 NOTE — Progress Notes (Signed)
Daily Session Note  Patient Details  Name: Victoria Avery MRN: [id number] Date of Birth: [birth year] Referring Provider:     Cardiac Rehab from 02/28/2020 in [facility] Cardiac and Pulmonary Rehab  Referring Provider Ida Rogue MD      Encounter Date: 05/30/2020  Check In:  Session Check In - 05/30/20 1145      Check-In   Supervising physician immediately available to respond to emergencies See telemetry face sheet for immediately available ER MD    Location [facility]-Cardiac & Pulmonary Rehab    Staff Present Nyoka Cowden, RN, BSN, MA;Meredith Sherryll Burger, RN BSN;Jessica Hawkins, MA, RCEP, CCRP, CCET    Virtual Visit No    Medication changes reported     No    Fall or balance concerns reported    No    Tobacco Cessation No Change    Warm-up and Cool-down Performed on first and last piece of equipment    Resistance Training Performed Yes    VAD Patient? No    PAD/SET Patient? No      Pain Assessment   Currently in Pain? No/denies              Social History   Tobacco Use  Smoking Status Current Every Day Smoker  . Packs/day: 0.25  . Years: 30.00  . Pack years: 7.50  . Types: Cigarettes  Smokeless Tobacco Never Used    Goals Met:  Independence with exercise equipment Exercise tolerated well No report of cardiac concerns or symptoms Strength training completed today  Goals Unmet:  Not Applicable  Comments: Pt able to follow exercise prescription today without complaint.  Will continue to monitor for progression.   Dr. Emily Filbert is Medical Director for [place] and [name].

## 2020-06-04 ENCOUNTER — Other Ambulatory Visit: Payer: Self-pay

## 2020-06-04 ENCOUNTER — Encounter: Payer: BC Managed Care – PPO | Admitting: *Deleted

## 2020-06-04 DIAGNOSIS — I252 Old myocardial infarction: Secondary | ICD-10-CM | POA: Diagnosis not present

## 2020-06-04 DIAGNOSIS — Z951 Presence of aortocoronary bypass graft: Secondary | ICD-10-CM

## 2020-06-04 NOTE — Progress Notes (Signed)
Discharge Progress Report  Patient Details  Name: Victoria Avery MRN: [id number] Date of Birth: [birth year] Referring Provider:     Cardiac Rehab from 02/28/2020 in [facility] Cardiac and Pulmonary Rehab  Referring Provider Ida Rogue MD       Number of Visits: 36  Reason for Discharge:  Patient reached a stable level of exercise. Patient independent in their exercise. Patient has met program and personal goals.  Smoking History:  Social History   Tobacco Use  Smoking Status Current Every Day Smoker  . Packs/day: 0.25  . Years: 30.00  . Pack years: 7.50  . Types: Cigarettes  Smokeless Tobacco Never Used    Diagnosis:  S/P CABG x 3  ADL UCSD:   Initial Exercise Prescription:  Initial Exercise Prescription - 02/28/20 1200      Date of Initial Exercise RX and Referring Provider   Date 02/28/20    Referring Provider Ida Rogue MD      Treadmill   MPH 2.6    Grade 1    Minutes 15    METs 3.35      Recumbant Bike   Level 4    RPM 50    Watts 59    Minutes 15    METs 3      NuStep   Level 4    SPM 80    Minutes 15    METs 3      Recumbant Elliptical   Level 2    RPM 50    Minutes 15    METs 3      Prescription Details   Frequency (times per week) 3    Duration Progress to 30 minutes of continuous aerobic without signs/symptoms of physical distress      Intensity   THRR 40-80% of Max Heartrate 121-183    Ratings of Perceived Exertion 11-13    Perceived Dyspnea 0-4      Progression   Progression Continue to progress workloads to maintain intensity without signs/symptoms of physical distress.      Resistance Training   Training Prescription Yes    Weight 3 lb    Reps 10-15           Discharge Exercise Prescription (Final Exercise Prescription Changes):  Exercise Prescription Changes - 05/27/20 1300      Response to Exercise   Blood Pressure (Admit) 158/78    Blood Pressure (Exercise) 188/82    Blood Pressure (Exit) 128/86     Heart Rate (Admit) 76 bpm    Heart Rate (Exercise) 120 bpm    Heart Rate (Exit) 63 bpm    Rating of Perceived Exertion (Exercise) 13    Symptoms none    Duration Progress to 30 minutes of  aerobic without signs/symptoms of physical distress    Intensity THRR unchanged      Progression   Progression Continue to progress workloads to maintain intensity without signs/symptoms of physical distress.    Average METs 4.98      Resistance Training   Training Prescription Yes    Weight 5 lb    Reps 10-15      Interval Training   Interval Training No      Treadmill   MPH 3.2    Grade 9    Minutes 15    METs 7.4      Recumbant Bike   Level 4.5    Minutes 15      NuStep   Level 5    Minutes  15    METs 4.7      Recumbant Elliptical   Level 4.5    Minutes 15    METs 3.3      Home Exercise Plan   Plans to continue exercise at Home (comment)   walking and staff videos   Frequency Add 2 additional days to program exercise sessions.    Initial Home Exercises Provided 03/21/20           Functional Capacity:  6 Minute Walk    Row Name 02/28/20 1241 05/26/20 1136       6 Minute Walk   Phase Initial Discharge    Distance 1415 feet 1700 feet    Distance % Change -- 20 %    Distance Feet Change -- 285 ft    Walk Time 6 minutes 6 minutes    # of Rest Breaks 0 0    MPH 2.68 3.22    METS 4.33 5.06    RPE 11 12    Perceived Dyspnea  2 0    VO2 Peak 15.14 17.72    Symptoms Yes (comment) No    Comments SOB --    Resting HR 89 bpm 76 bpm    Resting BP 136/74 158/78    Resting Oxygen Saturation  99 % --    Exercise Oxygen Saturation  during 6 min walk 98 % --    Max Ex. HR 117 bpm 120 bpm    Max Ex. BP 166/76 188/82    2 Minute Post BP 144/74 --           Psychological, QOL, Others - Outcomes: PHQ 2/9: Depression screen PHQ 2/9 02/28/2020  Decreased Interest 0  Down, Depressed, Hopeless 0  PHQ - 2 Score 0  Altered sleeping 0  Tired, decreased energy 1  Change in  appetite 0  Feeling bad or failure about yourself  0  Trouble concentrating 0  Moving slowly or fidgety/restless 0  Suicidal thoughts 0  PHQ-9 Score 1  Difficult doing work/chores Not difficult at all    Quality of Life:  Quality of Life - 02/28/20 1328      Quality of Life   Select Quality of Life      Quality of Life Scores   Health/Function Pre 26.13 %    Socioeconomic Pre 30 %    Psych/Spiritual Pre 27.21 %    Family Pre 28.8 %    GLOBAL Pre 27.54 %          Nutrition & Weight - Outcomes:  Pre Biometrics - 02/28/20 1244      Pre Biometrics   Height 5' 3.1" (1.603 m)    Weight 178 lb 4.8 oz (80.9 kg)    BMI (Calculated) 31.47    Single Leg Stand 19.66 seconds            Nutrition:  Nutrition Therapy & Goals - 03/18/20 1201      Nutrition Therapy   Diet low Na, HH    Drug/Food Interactions Statins/Certain Fruits    Protein (specify units) 65-70g    Fiber 25 grams    Whole Grain Foods 3 servings    Saturated Fats 12 max. grams    Fruits and Vegetables 5 servings/day    Sodium 1.5 grams      Personal Nutrition Goals   Nutrition Goal LT: increase stamina on exertion    Comments B: oatmeal L: varies D: pt reports not eating a lot; eating fried foods, not many  vegetables. Motivation. Discussed HH eating, discussed planning and shopping on a budget. Pt would like to eat consistently, talked about healthy things to eat on hand; pt reports making bean salad and pasta salad.      Intervention Plan   Intervention Prescribe, educate and counsel regarding individualized specific dietary modifications aiming towards targeted core components such as weight, hypertension, lipid management, diabetes, heart failure and other comorbidities.;Nutrition handout(s) given to patient.    Expected Outcomes Short Term Goal: Understand basic principles of dietary content, such as calories, fat, sodium, cholesterol and nutrients.;Short Term Goal: A plan has been developed with  personal nutrition goals set during dietitian appointment.;Long Term Goal: Adherence to prescribed nutrition plan.           Nutrition Discharge:  Nutrition Assessments - 02/28/20 1329      MEDFICTS Scores   Pre Score 25           Education Questionnaire Score:  Knowledge Questionnaire Score - 02/28/20 1329      Knowledge Questionnaire Score   Pre Score 24/26 Education Focus: Angina, Exercise           Goals reviewed with patient; copy given to patient.

## 2020-06-04 NOTE — Progress Notes (Signed)
Daily Session Note ° °Patient Details  °Name: Victoria Avery °MRN: [id number] °Date of Birth: 09/30/1966 °Referring Provider:   °  Cardiac Rehab from 02/28/2020 in [facility] Cardiac and Pulmonary Rehab  °Referring Provider Gollan, Timothy MD  °  ° ° °Encounter Date: 06/04/2020 ° °Check In: ° Session Check In - 06/04/20 1110   °  ° Check-In  ° Supervising physician immediately available to respond to emergencies See telemetry face sheet for immediately available ER MD   ° Location [facility]-Cardiac & Pulmonary Rehab   ° Staff Present Meredith Craven, RN BSN;Joseph Hood RCP,RRT,BSRT;Jessica Hawkins, MA, RCEP, CCRP, CCET;Amanda Sommer, BA, ACSM CEP, Exercise Physiologist   ° Virtual Visit No   ° Medication changes reported     No   ° Fall or balance concerns reported    No   ° Warm-up and Cool-down Performed on first and last piece of equipment   ° Resistance Training Performed Yes   ° VAD Patient? No   ° PAD/SET Patient? No   °  ° Pain Assessment  ° Currently in Pain? No/denies   °  °  °  ° ° ° ° ° °Social History  ° °Tobacco Use  °Smoking Status Current Every Day Smoker  °• Packs/day: 0.25  °• Years: 30.00  °• Pack years: 7.50  °• Types: Cigarettes  °Smokeless Tobacco Never Used  ° ° °Goals Met:  °Independence with exercise equipment °Exercise tolerated well °No report of cardiac concerns or symptoms °Strength training completed today ° °Goals Unmet:  °Not Applicable ° °Comments:  Victoria Avery graduated today from  rehab with 36 sessions completed.  Details of the patient's exercise prescription and what She needs to do in order to continue the prescription and progress were discussed with patient.  Patient was given a copy of prescription and goals.  Patient verbalized understanding.  [name] plans to continue to exercise by walking at home and doing staff videos. ° ° ° °Dr. Mark Miller is Medical Director for [facility] and [name]. °

## 2020-06-04 NOTE — Patient Instructions (Signed)
Discharge Patient Instructions  Patient Details  Name: Victoria Avery MRN: [id number] Date of Birth: [birth year] Referring Provider:  Minna Merritts, MD   Number of Visits: 26  Reason for Discharge:  Patient reached a stable level of exercise. Patient independent in their exercise. Patient has met program and personal goals.  Smoking History:  Social History   Tobacco Use  Smoking Status Current Every Day Smoker  . Packs/day: 0.25  . Years: 30.00  . Pack years: 7.50  . Types: Cigarettes  Smokeless Tobacco Never Used    Diagnosis:  S/P CABG x 3  Initial Exercise Prescription:  Initial Exercise Prescription - 02/28/20 1200      Date of Initial Exercise RX and Referring Provider   Date 02/28/20    Referring Provider Ida Rogue MD      Treadmill   MPH 2.6    Grade 1    Minutes 15    METs 3.35      Recumbant Bike   Level 4    RPM 50    Watts 59    Minutes 15    METs 3      NuStep   Level 4    SPM 80    Minutes 15    METs 3      Recumbant Elliptical   Level 2    RPM 50    Minutes 15    METs 3      Prescription Details   Frequency (times per week) 3    Duration Progress to 30 minutes of continuous aerobic without signs/symptoms of physical distress      Intensity   THRR 40-80% of Max Heartrate 121-183    Ratings of Perceived Exertion 11-13    Perceived Dyspnea 0-4      Progression   Progression Continue to progress workloads to maintain intensity without signs/symptoms of physical distress.      Resistance Training   Training Prescription Yes    Weight 3 lb    Reps 10-15           Discharge Exercise Prescription (Final Exercise Prescription Changes):  Exercise Prescription Changes - 05/27/20 1300      Response to Exercise   Blood Pressure (Admit) 158/78    Blood Pressure (Exercise) 188/82    Blood Pressure (Exit) 128/86    Heart Rate (Admit) 76 bpm    Heart Rate (Exercise) 120 bpm    Heart Rate (Exit) 63 bpm    Rating of  Perceived Exertion (Exercise) 13    Symptoms none    Duration Progress to 30 minutes of  aerobic without signs/symptoms of physical distress    Intensity THRR unchanged      Progression   Progression Continue to progress workloads to maintain intensity without signs/symptoms of physical distress.    Average METs 4.98      Resistance Training   Training Prescription Yes    Weight 5 lb    Reps 10-15      Interval Training   Interval Training No      Treadmill   MPH 3.2    Grade 9    Minutes 15    METs 7.4      Recumbant Bike   Level 4.5    Minutes 15      NuStep   Level 5    Minutes 15    METs 4.7      Recumbant Elliptical   Level 4.5    Minutes 15  METs 3.3      Home Exercise Plan   Plans to continue exercise at Home (comment)   walking and staff videos   Frequency Add 2 additional days to program exercise sessions.    Initial Home Exercises Provided 03/21/20           Functional Capacity:  6 Minute Walk    Row Name 02/28/20 1241 05/26/20 1136       6 Minute Walk   Phase Initial Discharge    Distance 1415 feet 1700 feet    Distance % Change -- 20 %    Distance Feet Change -- 285 ft    Walk Time 6 minutes 6 minutes    # of Rest Breaks 0 0    MPH 2.68 3.22    METS 4.33 5.06    RPE 11 12    Perceived Dyspnea  2 0    VO2 Peak 15.14 17.72    Symptoms Yes (comment) No    Comments SOB --    Resting HR 89 bpm 76 bpm    Resting BP 136/74 158/78    Resting Oxygen Saturation  99 % --    Exercise Oxygen Saturation  during 6 min walk 98 % --    Max Ex. HR 117 bpm 120 bpm    Max Ex. BP 166/76 188/82    2 Minute Post BP 144/74 --           Quality of Life:  Quality of Life - 02/28/20 1328      Quality of Life   Select Quality of Life      Quality of Life Scores   Health/Function Pre 26.13 %    Socioeconomic Pre 30 %    Psych/Spiritual Pre 27.21 %    Family Pre 28.8 %    GLOBAL Pre 27.54 %               Nutrition & Weight - Outcomes:   Pre Biometrics - 02/28/20 1244      Pre Biometrics   Height 5' 3.1" (1.603 m)    Weight 178 lb 4.8 oz (80.9 kg)    BMI (Calculated) 31.47    Single Leg Stand 19.66 seconds            Nutrition:  Nutrition Therapy & Goals - 03/18/20 1201      Nutrition Therapy   Diet low Na, HH    Drug/Food Interactions Statins/Certain Fruits    Protein (specify units) 65-70g    Fiber 25 grams    Whole Grain Foods 3 servings    Saturated Fats 12 max. grams    Fruits and Vegetables 5 servings/day    Sodium 1.5 grams      Personal Nutrition Goals   Nutrition Goal LT: increase stamina on exertion    Comments B: oatmeal L: varies D: pt reports not eating a lot; eating fried foods, not many vegetables. Motivation. Discussed HH eating, discussed planning and shopping on a budget. Pt would like to eat consistently, talked about healthy things to eat on hand; pt reports making bean salad and pasta salad.      Intervention Plan   Intervention Prescribe, educate and counsel regarding individualized specific dietary modifications aiming towards targeted core components such as weight, hypertension, lipid management, diabetes, heart failure and other comorbidities.;Nutrition handout(s) given to patient.    Expected Outcomes Short Term Goal: Understand basic principles of dietary content, such as calories, fat, sodium, cholesterol and nutrients.;Short Term Goal: A  plan has been developed with personal nutrition goals set during dietitian appointment.;Long Term Goal: Adherence to prescribed nutrition plan.           Nutrition Discharge:  Nutrition Assessments - 02/28/20 1329      MEDFICTS Scores   Pre Score 25           Education Questionnaire Score:  Knowledge Questionnaire Score - 02/28/20 1329      Knowledge Questionnaire Score   Pre Score 24/26 Education Focus: Angina, Exercise           Goals reviewed with patient; copy given to patient.

## 2020-06-04 NOTE — Progress Notes (Signed)
Cardiac Individual Treatment Plan  Patient Details  Name: DELPHINE SIZEMORE MRN: [id number] Date of Birth: 06/10/1966 Referring Provider:     Cardiac Rehab from 02/28/2020 in [facility] Cardiac and Pulmonary Rehab  Referring Provider Ida Rogue MD      Initial Encounter Date:    Cardiac Rehab from 02/28/2020 in [facility] Cardiac and Pulmonary Rehab  Date 02/28/20      Visit Diagnosis: S/P CABG x 3  Patient's Home Medications on Admission:  Current Outpatient Medications:  .  acetaminophen (TYLENOL) 500 MG tablet, Take 2 tablets (1,000 mg total) by mouth every 6 (six) hours as needed., Disp: 30 tablet, Rfl: 0 .  aspirin EC 325 MG EC tablet, Take 1 tablet (325 mg total) by mouth daily., Disp: 30 tablet, Rfl: 0 .  atorvastatin (LIPITOR) 80 MG tablet, Take 1 tablet (80 mg total) by mouth daily at 6 PM., Disp: 90 tablet, Rfl: 1 .  metoprolol succinate (TOPROL-XL) 50 MG 24 hr tablet, Take 1 tablet (50 mg total) by mouth daily. Take with or immediately following a meal., Disp: 90 tablet, Rfl: 3 .  nitroGLYCERIN (NITROSTAT) 0.4 MG SL tablet, Place 1 tablet (0.4 mg total) under the tongue every 5 (five) minutes as needed for chest pain., Disp: 25 tablet, Rfl: 3 .  varenicline (CHANTIX CONTINUING MONTH PAK) 1 MG tablet, Take 1 tablet (1 mg total) by mouth 2 (two) times daily., Disp: 60 tablet, Rfl: 4  Past Medical History: Past Medical History:  Diagnosis Date  . Cardiomyopathy ([place])    a. 10/2017 Echo: EF 45-50% Gr1 DD, mild MR. Nl LA size. Nl RV fxn and PASP.  Marland Kitchen Hypertension   . Tobacco abuse     Tobacco Use: Social History   Tobacco Use  Smoking Status Current Every Day Smoker  . Packs/day: 0.25  . Years: 30.00  . Pack years: 7.50  . Types: Cigarettes  Smokeless Tobacco Never Used    Labs: Recent Review Flowsheet Data    Labs for ITP Cardiac and Pulmonary Rehab Latest Ref Rng & Units 01/18/2020 01/18/2020 01/18/2020 01/18/2020 04/07/2020   Cholestrol 100 - 199 mg/dL - - - - 130    LDLCALC 0 - 99 mg/dL - - - - 67   HDL >39 mg/dL - - - - 28(L)   Trlycerides 0 - 149 mg/dL - - - - 207(H)   Hemoglobin A1c 4.8 - 5.6 % - - - - -   PHART 7.35 - 7.45 7.318(L) 7.324(L) 7.338(L) 7.357 -   PCO2ART 32 - 48 mmHg 45.5 45.9 35.0 44.3 -   HCO3 20.0 - 28.0 mmol/L 23.3 24.2 18.7(L) 24.6 -   TCO2 22 - 32 mmol/L 25 26 20(L) 26 -   ACIDBASEDEF 0.0 - 2.0 mmol/L 3.0(H) 2.0 6.0(H) 1.0 -   O2SAT % 98.0 99.0 99.0 98.0 -       Exercise Target Goals: Exercise Program Goal: Individual exercise prescription set using results from initial 6 min walk test and THRR while considering  patient's activity barriers and safety.   Exercise Prescription Goal: Initial exercise prescription builds to 30-45 minutes a day of aerobic activity, 2-3 days per week.  Home exercise guidelines will be given to patient during program as part of exercise prescription that the participant will acknowledge.   Education: Aerobic Exercise & Resistance Training: - Gives group verbal and written instruction on the various components of exercise. Focuses on aerobic and resistive training programs and the benefits of this training and how to safely  progress through these programs..   Education: Exercise & Equipment Safety: - Individual verbal instruction and demonstration of equipment use and safety with use of the equipment.   Cardiac Rehab from 02/26/2020 in [facility] Cardiac and Pulmonary Rehab  Date 02/26/20  Educator [facility]  Instruction Review Code 1- Verbalizes Understanding      Education: Exercise Physiology & General Exercise Guidelines: - Group verbal and written instruction with models to review the exercise physiology of the cardiovascular system and associated critical values. Provides general exercise guidelines with specific guidelines to those with heart or lung disease.    Education: Flexibility, Balance, Mind/Body Relaxation: Provides group verbal/written instruction on the benefits of flexibility and  balance training, including mind/body exercise modes such as yoga, pilates and tai chi.  Demonstration and skill practice provided.   Activity Barriers & Risk Stratification:  Activity Barriers & Cardiac Risk Stratification - 02/28/20 1242      Activity Barriers & Cardiac Risk Stratification   Activity Barriers Deconditioning;Muscular Weakness;Shortness of Breath    Cardiac Risk Stratification High           6 Minute Walk:  6 Minute Walk    Row Name 02/28/20 1241 05/26/20 1136       6 Minute Walk   Phase Initial Discharge    Distance 1415 feet 1700 feet    Distance % Change -- 20 %    Distance Feet Change -- 285 ft    Walk Time 6 minutes 6 minutes    # of Rest Breaks 0 0    MPH 2.68 3.22    METS 4.33 5.06    RPE 11 12    Perceived Dyspnea  2 0    VO2 Peak 15.14 17.72    Symptoms Yes (comment) No    Comments SOB --    Resting HR 89 bpm 76 bpm    Resting BP 136/74 158/78    Resting Oxygen Saturation  99 % --    Exercise Oxygen Saturation  during 6 min walk 98 % --    Max Ex. HR 117 bpm 120 bpm    Max Ex. BP 166/76 188/82    2 Minute Post BP 144/74 --           Oxygen Initial Assessment:   Oxygen Re-Evaluation:   Oxygen Discharge (Final Oxygen Re-Evaluation):   Initial Exercise Prescription:  Initial Exercise Prescription - 02/28/20 1200      Date of Initial Exercise RX and Referring Provider   Date 02/28/20    Referring Provider Ida Rogue MD      Treadmill   MPH 2.6    Grade 1    Minutes 15    METs 3.35      Recumbant Bike   Level 4    RPM 50    Watts 59    Minutes 15    METs 3      NuStep   Level 4    SPM 80    Minutes 15    METs 3      Recumbant Elliptical   Level 2    RPM 50    Minutes 15    METs 3      Prescription Details   Frequency (times per week) 3    Duration Progress to 30 minutes of continuous aerobic without signs/symptoms of physical distress      Intensity   THRR 40-80% of Max Heartrate 121-183    Ratings  of Perceived Exertion 11-13  Perceived Dyspnea 0-4      Progression   Progression Continue to progress workloads to maintain intensity without signs/symptoms of physical distress.      Resistance Training   Training Prescription Yes    Weight 3 lb    Reps 10-15           Perform Capillary Blood Glucose checks as needed.  Exercise Prescription Changes:  Exercise Prescription Changes    Row Name 02/28/20 1200 03/06/20 1300 03/19/20 1100 03/21/20 1100 04/01/20 1500     Response to Exercise   Blood Pressure (Admit) 134/74 130/78 140/88 -- 130/86   Blood Pressure (Exercise) 166/76 162/84 144/72 -- 124/66   Blood Pressure (Exit) 144/74 124/78 156/88 -- 122/76   Heart Rate (Admit) 89 bpm 85 bpm 81 bpm -- 94 bpm   Heart Rate (Exercise) 117 bpm 123 bpm 117 bpm -- 125 bpm   Heart Rate (Exit) 90 bpm 99 bpm 102 bpm -- 103 bpm   Oxygen Saturation (Admit) 99 % -- -- -- --   Oxygen Saturation (Exercise) 98 % -- -- -- --   Rating of Perceived Exertion (Exercise) _0 -- 16   Perceived Dyspnea (Exercise) 2 -- -- -- --   Symptoms SOB -- none -- none   Comments walk test results first day -- -- --   Duration -- -- Continue with 30 min of aerobic exercise without signs/symptoms of physical distress. -- Continue with 30 min of aerobic exercise without signs/symptoms of physical distress.   Intensity -- -- THRR unchanged -- THRR unchanged     Progression   Progression -- -- Continue to progress workloads to maintain intensity without signs/symptoms of physical distress. -- Continue to progress workloads to maintain intensity without signs/symptoms of physical distress.   Average METs -- -- 2.68 -- 3.36     Resistance Training   Training Prescription -- -- Yes -- Yes   Weight -- -- 3 lb -- 3 lb   Reps -- -- 10-15 -- 10-15     Interval Training   Interval Training -- -- No -- No     Treadmill   MPH -- -- 2.6 -- 2.6   Grade -- -- 1 -- 1   Minutes -- -- 15 -- 15   METs -- -- 3.35 --  3.35     Recumbant Bike   Level -- -- 4 -- 4.5   Watts -- -- -- -- 15   Minutes -- -- 15 -- 4.31     NuStep   Level -- -- 4 -- 5   Minutes -- -- 15 -- 15   METs -- -- 2.6 -- 3.6     Recumbant Elliptical   Level -- -- 2.5 -- 2.5   Minutes -- -- 15 -- 15   METs -- -- 2.1 -- 2.2     Home Exercise Plan   Plans to continue exercise at -- -- -- Home (comment)  walking and staff videos --   Frequency -- -- -- Add 2 additional days to program exercise sessions. --   Initial Home Exercises Provided -- -- -- 03/21/20 --   [place] Name 04/14/20 1500 04/29/20 1600 05/14/20 1500 05/27/20 1300       Response to Exercise   Blood Pressure (Admit) 126/64 128/70 156/78 158/78    Blood Pressure (Exercise) 152/64 160/78 158/84 188/82    Blood Pressure (Exit) 124/64 124/70 142/80 128/86    Heart Rate (Admit) 82 bpm 95 bpm 72  bpm 76 bpm    Heart Rate (Exercise) 115 bpm 111 bpm 110 bpm 120 bpm    Heart Rate (Exit) 81 bpm 88 bpm 85 bpm 63 bpm    Rating of Perceived Exertion (Exercise) _0 Symptoms none none none none    Duration Continue with 30 min of aerobic exercise without signs/symptoms of physical distress. Continue with 30 min of aerobic exercise without signs/symptoms of physical distress. Continue with 30 min of aerobic exercise without signs/symptoms of physical distress. Progress to 30 minutes of  aerobic without signs/symptoms of physical distress    Intensity THRR unchanged THRR unchanged THRR unchanged THRR unchanged      Progression   Progression Continue to progress workloads to maintain intensity without signs/symptoms of physical distress. Continue to progress workloads to maintain intensity without signs/symptoms of physical distress. Continue to progress workloads to maintain intensity without signs/symptoms of physical distress. Continue to progress workloads to maintain intensity without signs/symptoms of physical distress.    Average METs 3.85 3.9 5.12 4.98       Resistance Training   Training Prescription Yes Yes Yes Yes    Weight 3 lb 5 lb 5 lb 5 lb    Reps 10-15 10-15 10-15 10-15      Interval Training   Interval Training No No No No      Treadmill   MPH 2.8 -- 3.2 3.2    Grade 3 -- 9 9    Minutes 15 -- 15 15    METs 4.3 -- -- 7.4      Recumbant Bike   Level 4.5 4.5 -- 4.5    Minutes 15 15 -- 15    METs 4.9 4.32 -- --      NuStep   Level 5 5 -- 5    SPM -- 80 -- --    Minutes 15 15 -- 15    METs 4 3.5 -- 4.7      Recumbant Elliptical   Level 3 -- 4.5 4.5    Minutes 15 -- 15 15    METs 2.2 -- 5.3 3.3      Home Exercise Plan   Plans to continue exercise at Home (comment)  walking and staff videos -- Home (comment)  walking and staff videos Home (comment)  walking and staff videos    Frequency Add 2 additional days to program exercise sessions. -- Add 2 additional days to program exercise sessions. Add 2 additional days to program exercise sessions.    Initial Home Exercises Provided 03/21/20 -- 03/21/20 03/21/20           Exercise Comments:  Exercise Comments    Row Name 03/04/20 1148           Exercise Comments First full day of exercise!  Patient was oriented to gym and equipment including functions, settings, policies, and procedures.  Patient's individual exercise prescription and treatment plan were reviewed.  All starting workloads were established based on the results of the 6 minute walk test done at initial orientation visit.  The plan for exercise progression was also introduced and progression will be customized based on patient's performance and goals.              Exercise Goals and Review:  Exercise Goals    Row Name 02/28/20 1244             Exercise Goals   Increase Physical Activity Yes  Intervention Provide advice, education, support and counseling about physical activity/exercise needs.;Develop an individualized exercise prescription for aerobic and resistive training based on initial  evaluation findings, risk stratification, comorbidities and participant's personal goals.       Expected Outcomes Short Term: Attend rehab on a regular basis to increase amount of physical activity.;Long Term: Add in home exercise to make exercise part of routine and to increase amount of physical activity.;Long Term: Exercising regularly at least 3-5 days a week.       Increase Strength and Stamina Yes       Intervention Provide advice, education, support and counseling about physical activity/exercise needs.;Develop an individualized exercise prescription for aerobic and resistive training based on initial evaluation findings, risk stratification, comorbidities and participant's personal goals.       Expected Outcomes Short Term: Increase workloads from initial exercise prescription for resistance, speed, and METs.;Long Term: Improve cardiorespiratory fitness, muscular endurance and strength as measured by increased METs and functional capacity (6MWT);Short Term: Perform resistance training exercises routinely during rehab and add in resistance training at home       Able to understand and use rate of perceived exertion (RPE) scale Yes       Intervention Provide education and explanation on how to use RPE scale       Expected Outcomes Short Term: Able to use RPE daily in rehab to express subjective intensity level;Long Term:  Able to use RPE to guide intensity level when exercising independently       Able to understand and use Dyspnea scale Yes       Intervention Provide education and explanation on how to use Dyspnea scale       Expected Outcomes Short Term: Able to use Dyspnea scale daily in rehab to express subjective sense of shortness of breath during exertion;Long Term: Able to use Dyspnea scale to guide intensity level when exercising independently       Knowledge and understanding of Target Heart Rate Range (THRR) Yes       Intervention Provide education and explanation of THRR including how  the numbers were predicted and where they are located for reference       Expected Outcomes Short Term: Able to state/look up THRR;Short Term: Able to use daily as guideline for intensity in rehab;Long Term: Able to use THRR to govern intensity when exercising independently       Able to check pulse independently Yes       Intervention Provide education and demonstration on how to check pulse in carotid and radial arteries.;Review the importance of being able to check your own pulse for safety during independent exercise       Expected Outcomes Short Term: Able to explain why pulse checking is important during independent exercise;Long Term: Able to check pulse independently and accurately       Understanding of Exercise Prescription Yes       Intervention Provide education, explanation, and written materials on patient's individual exercise prescription       Expected Outcomes Short Term: Able to explain program exercise prescription;Long Term: Able to explain home exercise prescription to exercise independently              Exercise Goals Re-Evaluation :  Exercise Goals Re-Evaluation    Row Name 03/04/20 1148 03/19/20 1125 03/21/20 1154 04/01/20 1509 04/02/20 1141     Exercise Goal Re-Evaluation   Exercise Goals Review Able to understand and use rate of perceived exertion (RPE) scale;Knowledge and understanding of  Target Heart Rate Range (THRR);Able to check pulse independently;Understanding of Exercise Prescription Increase Physical Activity;Increase Strength and Stamina;Understanding of Exercise Prescription Increase Physical Activity;Increase Strength and Stamina;Understanding of Exercise Prescription;Able to understand and use rate of perceived exertion (RPE) scale;Able to understand and use Dyspnea scale;Knowledge and understanding of Target Heart Rate Range (THRR);Able to check pulse independently Increase Physical Activity;Increase Strength and Stamina;Able to understand and use rate of  perceived exertion (RPE) scale;Able to understand and use Dyspnea scale;Knowledge and understanding of Target Heart Rate Range (THRR);Able to check pulse independently;Understanding of Exercise Prescription Increase Physical Activity;Increase Strength and Stamina;Able to understand and use rate of perceived exertion (RPE) scale;Able to understand and use Dyspnea scale;Knowledge and understanding of Target Heart Rate Range (THRR);Able to check pulse independently;Understanding of Exercise Prescription   Comments Reviewed RPE scale, THR and program prescription with pt today.  Pt voiced understanding and was given a copy of goals to take home. Tiwanna is off to a great start in rehab.  She is already up to level 2.5 on the recumbent elliptical.  We will continue to monitor her progress. Reviewed home exercise with pt today.  Pt plans to walk and use staff videos at home for exercise.  Reviewed THR, pulse, RPE, sign and symptoms, NTG use, and when to call 911 or MD.  Also discussed weather considerations and indoor options.  Pt voiced understanding. Ceniyah is progressing well.  She finds walking easier than seated machines.  She does work in [employer] range [place] is walking a mile on days she isnt at [facility] and sometimes in addition to HT.   Expected Outcomes Short: Use RPE daily to regulate intensity. Long: Follow program prescription in THR. Short: Continue to increase workloads  Long: Continue to follow program prescription. Short: Start to add in exercise  Long: Continue to improve stamina. Short:  continue to work in [employer] ranges Long: improve overall MET level Short:  continue to exercise consistently Long: improve overall stamina   Row Name 04/14/20 1508 04/25/20 1132 04/29/20 1613 05/14/20 1522 05/19/20 1145     Exercise Goal Re-Evaluation   Exercise Goals Review Increase Physical Activity;Increase Strength and Stamina;Understanding of Exercise Prescription Increase Physical Activity;Increase Strength and  Stamina;Understanding of Exercise Prescription Increase Physical Activity;Increase Strength and Stamina;Understanding of Exercise Prescription Increase Physical Activity;Increase Strength and Stamina;Understanding of Exercise Prescription Increase Physical Activity;Increase Strength and Stamina;Understanding of Exercise Prescription   Comments Maiana has been doing well in rehab.  She is now up to level 5 on the NuStep and 4.5 on the bike.  She has returned to work, but comes to exercise on an extended lunch hour.  We will continue to monitor her progress. Verne is doing well in rehab.  She is still adjusting back to her 9 hour shift days.  She will slowly  start to add back in her walking at home.  She does stay busy and active at work.  Her strength and stamina are getting better and she is able to handle the long days again. She is still tired when she gets home. Tamelia is making steady progress in increasing MET levels.  She has also increased to 5 lb weights for strength training. Shalice has been doing well in rehab.  She is up to 9% grade on her treadmill now.  We will continue to monitor her progression. Zailey has doe well with rehab and has become fairly independent with her exercise routine. She is soon to graduate from Cardiac Rehab and plans to join either  the [facility] at the [facility] campus or a local community gym to continue exericse.   Expected Outcomes Short: Increase hand weights  Long: Continue to improve stamina. Short: Continue to add in walking  Long: Continue to improve stamina. Short: continue to attend consistently Long: improve overall MET level Short: COntinue to encourage higher rpm on REL  Long: Continue to improve stamina. Short: graduate from Cardiac Rehab and join gym to continue exercise. Long: Continue to manage cardiac risk factors with independent exercise program.   Row Name 05/27/20 1338             Exercise Goal Re-Evaluation   Exercise Goals Review Increase Physical  Activity;Increase Strength and Stamina;Understanding of Exercise Prescription       Comments Rosezella continues to exhibit great efforts here at cardiac rehab, continues to maintain indepedent exercise and will plan to continue exercising after graduation.       Expected Outcomes Short: Plan to graduate from the cardiac rehab program Long: Able to maintain independence exercise at home, monioring HR and using RPE scale, manage lifestyle risk factors              Discharge Exercise Prescription (Final Exercise Prescription Changes):  Exercise Prescription Changes - 05/27/20 1300      Response to Exercise   Blood Pressure (Admit) 158/78    Blood Pressure (Exercise) 188/82    Blood Pressure (Exit) 128/86    Heart Rate (Admit) 76 bpm    Heart Rate (Exercise) 120 bpm    Heart Rate (Exit) 63 bpm    Rating of Perceived Exertion (Exercise) 13    Symptoms none    Duration Progress to 30 minutes of  aerobic without signs/symptoms of physical distress    Intensity THRR unchanged      Progression   Progression Continue to progress workloads to maintain intensity without signs/symptoms of physical distress.    Average METs 4.98      Resistance Training   Training Prescription Yes    Weight 5 lb    Reps 10-15      Interval Training   Interval Training No      Treadmill   MPH 3.2    Grade 9    Minutes 15    METs 7.4      Recumbant Bike   Level 4.5    Minutes 15      NuStep   Level 5    Minutes 15    METs 4.7      Recumbant Elliptical   Level 4.5    Minutes 15    METs 3.3      Home Exercise Plan   Plans to continue exercise at Home (comment)   walking and staff videos   Frequency Add 2 additional days to program exercise sessions.    Initial Home Exercises Provided 03/21/20           Nutrition:  Target Goals: Understanding of nutrition guidelines, daily intake of sodium <1516m, cholesterol <2049m calories 30% from fat and 7% or less from saturated fats, daily to have  5 or more servings of fruits and vegetables.  Education: Controlling Sodium/Reading Food Labels -Group verbal and written material supporting the discussion of sodium use in heart healthy nutrition. Review and explanation with models, verbal and written materials for utilization of the food label.   Education: General Nutrition Guidelines/Fats and Fiber: -Group instruction provided by verbal, written material, models and posters to present the general guidelines for heart healthy nutrition. Gives  an explanation and review of dietary fats and fiber.   Biometrics:  Pre Biometrics - 02/28/20 1244      Pre Biometrics   Height 5' 3.1" (1.603 m)    Weight 178 lb 4.8 oz (80.9 kg)    BMI (Calculated) 31.47    Single Leg Stand 19.66 seconds            Nutrition Therapy Plan and Nutrition Goals:  Nutrition Therapy & Goals - 03/18/20 1201      Nutrition Therapy   Diet low Na, HH    Drug/Food Interactions Statins/Certain Fruits    Protein (specify units) 65-70g    Fiber 25 grams    Whole Grain Foods 3 servings    Saturated Fats 12 max. grams    Fruits and Vegetables 5 servings/day    Sodium 1.5 grams      Personal Nutrition Goals   Nutrition Goal LT: increase stamina on exertion    Comments B: oatmeal L: varies D: pt reports not eating a lot; eating fried foods, not many vegetables. Motivation. Discussed HH eating, discussed planning and shopping on a budget. Pt would like to eat consistently, talked about healthy things to eat on hand; pt reports making bean salad and pasta salad.      Intervention Plan   Intervention Prescribe, educate and counsel regarding individualized specific dietary modifications aiming towards targeted core components such as weight, hypertension, lipid management, diabetes, heart failure and other comorbidities.;Nutrition handout(s) given to patient.    Expected Outcomes Short Term Goal: Understand basic principles of dietary content, such as calories, fat,  sodium, cholesterol and nutrients.;Short Term Goal: A plan has been developed with personal nutrition goals set during dietitian appointment.;Long Term Goal: Adherence to prescribed nutrition plan.           Nutrition Assessments:  Nutrition Assessments - 02/28/20 1329      MEDFICTS Scores   Pre Score 25           MEDIFICTS Score Key:          ?70 Need to make dietary changes          40-70 Heart Healthy Diet         ? 40 Therapeutic Level Cholesterol Diet  Nutrition Goals Re-Evaluation:  Nutrition Goals Re-Evaluation    [place] Name 04/09/20 1136 05/19/20 1142           Goals   Nutrition Goal ST: LT: increase stamina on exertion --      Comment Nutrition has been going well. Preparring bean salad Kamisha feels like she has a good hand on proper nutrition after meeting with program dietician.      Expected Outcome -- Short: Graduate from cardiac rehab and continue to follow goals set by program dietician upon graduation. Long: Manage cardiac risk factors with heart healhty diet.             Nutrition Goals Discharge (Final Nutrition Goals Re-Evaluation):  Nutrition Goals Re-Evaluation - 05/19/20 1142      Goals   Comment Magda Paganini feels like she has a good hand on proper nutrition after meeting with program dietician.    Expected Outcome Short: Graduate from cardiac rehab and continue to follow goals set by program dietician upon graduation. Long: Manage cardiac risk factors with heart healhty diet.           Psychosocial: Target Goals: Acknowledge presence or absence of significant depression and/or stress, maximize coping skills, provide positive support system. Participant is able  to verbalize types and ability to use techniques and skills needed for reducing stress and depression.   Education: Depression - Provides group verbal and written instruction on the correlation between heart/lung disease and depressed mood, treatment options, and the stigmas associated with  seeking treatment.   Education: Sleep Hygiene -Provides group verbal and written instruction about how sleep can affect your health.  Define sleep hygiene, discuss sleep cycles and impact of sleep habits. Review good sleep hygiene tips.     Education: Stress and Anxiety: - Provides group verbal and written instruction about the health risks of elevated stress and causes of high stress.  Discuss the correlation between heart/lung disease and anxiety and treatment options. Review healthy ways to manage with stress and anxiety.    Initial Review & Psychosocial Screening:  Initial Psych Review & Screening - 02/26/20 1255      Initial Review   Current issues with None Identified      Family Dynamics   Good Support System? Yes    Comments She can look to her husband, son, brother, sisterin law, sister and co workers for support. She is being positive about improving her health and moving on with her life.      Barriers   Psychosocial barriers to participate in program There are no identifiable barriers or psychosocial needs.;The patient should benefit from training in stress management and relaxation.      Screening Interventions   Interventions Encouraged to exercise;Provide feedback about the scores to participant;To provide support and resources with identified psychosocial needs    Expected Outcomes Short Term goal: Utilizing psychosocial counselor, staff and physician to assist with identification of specific Stressors or current issues interfering with healing process. Setting desired goal for each stressor or current issue identified.;Long Term Goal: Stressors or current issues are controlled or eliminated.;Short Term goal: Identification and review with participant of any Quality of Life or Depression concerns found by scoring the questionnaire.;Long Term goal: The participant improves quality of Life and PHQ9 Scores as seen by post scores and/or verbalization of changes            Quality of Life Scores:   Quality of Life - 02/28/20 1328      Quality of Life   Select Quality of Life      Quality of Life Scores   Health/Function Pre 26.13 %    Socioeconomic Pre 30 %    Psych/Spiritual Pre 27.21 %    Family Pre 28.8 %    GLOBAL Pre 27.54 %          Scores of 19 and below usually indicate a poorer quality of life in these areas.  A difference of  2-3 points is a clinically meaningful difference.  A difference of 2-3 points in the total score of the Quality of Life Index has been associated with significant improvement in overall quality of life, self-image, physical symptoms, and general health in studies assessing change in quality of life.  PHQ-9: Recent Review Flowsheet Data    Depression screen [facility] 2/9 02/28/2020   Decreased Interest 0   Down, Depressed, Hopeless 0   PHQ - 2 Score 0   Altered sleeping 0   Tired, decreased energy 1   Change in appetite 0   Feeling bad or failure about yourself  0   Trouble concentrating 0   Moving slowly or fidgety/restless 0   Suicidal thoughts 0   PHQ-9 Score 1   Difficult doing work/chores Not difficult at  all     Interpretation of Total Score  Total Score Depression Severity:  1-4 = Minimal depression, 5-9 = Mild depression, 10-14 = Moderate depression, 15-19 = Moderately severe depression, 20-27 = Severe depression   Psychosocial Evaluation and Intervention:  Psychosocial Evaluation - 05/19/20 1148      Discharge Psychosocial Assessment & Intervention   Comments Acelynn has done well in Cardiac Rehab. She has gained strength and stamina, has cut back on smoking, and has been able to get back to working her full shifts at work. She plans to continue to exercise upon graduation and continue to follow nutrician goals to help control cardiac risk factors. Continue  being positivie in maintaining a heart healhty lifestyle.           Psychosocial Re-Evaluation:  Psychosocial Re-Evaluation    [place] Name  04/02/20 1139 04/25/20 1134           Psychosocial Re-Evaluation   Current issues with Current Stress Concerns;Current Sleep Concerns Current Stress Concerns;Current Sleep Concerns      Comments Margaretmary sleeps well.  She is concerned about going back to work.  They are allowing her to come to HT on her lunch break.  She has good support fro [facility], family and friends. Charliegh is doing well.  She is adjusting to being back at work.  She starting to get the hang of her long hours, but they are getting better. She continues to sleep well.  She has started Chantix and her sleep has gotten a little more disruptive with her tossing and turning.      Expected Outcomes Short:  lean on support system when needed Long:  maintain positive outlook Short: Continue to adjust to work schedule, quit smoking Long: Continue to stay positive.      Interventions -- Encouraged to attend Cardiac Rehabilitation for the exercise      Continue Psychosocial Services  -- Follow up required by staff             Psychosocial Discharge (Final Psychosocial Re-Evaluation):  Psychosocial Re-Evaluation - 04/25/20 1134      Psychosocial Re-Evaluation   Current issues with Current Stress Concerns;Current Sleep Concerns    Comments Sherie is doing well.  She is adjusting to being back at work.  She starting to get the hang of her long hours, but they are getting better. She continues to sleep well.  She has started Chantix and her sleep has gotten a little more disruptive with her tossing and turning.    Expected Outcomes Short: Continue to adjust to work schedule, quit smoking Long: Continue to stay positive.    Interventions Encouraged to attend Cardiac Rehabilitation for the exercise    Continue Psychosocial Services  Follow up required by staff           Vocational Rehabilitation: Provide vocational rehab assistance to qualifying candidates.   Vocational Rehab Evaluation & Intervention:   Education: Education  Goals: Education classes will be provided on a variety of topics geared toward better understanding of heart health and risk factor modification. Participant will state understanding/return demonstration of topics presented as noted by education test scores.  Learning Barriers/Preferences:  Learning Barriers/Preferences - 02/26/20 1300      Learning Barriers/Preferences   Learning Barriers Sight   wears glasses   Learning Preferences None           General Cardiac Education Topics:  AED/CPR: - Group verbal and written instruction with the use of models to demonstrate the  basic use of the AED with the basic ABC's of resuscitation.   Anatomy & Physiology of the Heart: - Group verbal and written instruction and models provide basic cardiac anatomy and physiology, with the coronary electrical and arterial systems. Review of Valvular disease and Heart Failure   Cardiac Procedures: - Group verbal and written instruction to review commonly prescribed medications for heart disease. Reviews the medication, class of the drug, and side effects. Includes the steps to properly store meds and maintain the prescription regimen. (beta blockers and nitrates)   Cardiac Medications I: - Group verbal and written instruction to review commonly prescribed medications for heart disease. Reviews the medication, class of the drug, and side effects. Includes the steps to properly store meds and maintain the prescription regimen.   Cardiac Medications II: -Group verbal and written instruction to review commonly prescribed medications for heart disease. Reviews the medication, class of the drug, and side effects. (all other drug classes)    Go Sex-Intimacy & Heart Disease, Get SMART - Goal Setting: - Group verbal and written instruction through game format to discuss heart disease and the return to sexual intimacy. Provides group verbal and written material to discuss and apply goal setting through the  application of the S.M.A.R.T. Method.   Other Matters of the Heart: - Provides group verbal, written materials and models to describe Stable Angina and Peripheral Artery. Includes description of the disease process and treatment options available to the cardiac patient.   Infection Prevention: - Provides verbal and written material to individual with discussion of infection control including proper hand washing and proper equipment cleaning during exercise session.   Cardiac Rehab from 02/26/2020 in [facility] Cardiac and Pulmonary Rehab  Date 02/26/20  Educator [facility]  Instruction Review Code 1- Verbalizes Understanding      Falls Prevention: - Provides verbal and written material to individual with discussion of falls prevention and safety.   Cardiac Rehab from 02/26/2020 in [facility] Cardiac [facility] Pulmonary Rehab  Date 02/26/20  Educator [facility]  Instruction Review Code 1- Verbalizes Understanding      Other: -Provides group and verbal instruction on various topics (see comments)   Knowledge Questionnaire Score:  Knowledge Questionnaire Score - 02/28/20 1329      Knowledge Questionnaire Score   Pre Score 24/26 Education Focus: Angina, Exercise           Core Components/Risk Factors/Patient Goals at Admission:  Personal Goals and Risk Factors at Admission - 02/28/20 1245      Core Components/Risk Factors/Patient Goals on Admission    Weight Management Yes;Weight Loss;Obesity    Intervention Weight Management: Develop a combined nutrition and exercise program designed to reach desired caloric intake, while maintaining appropriate intake of nutrient and fiber, sodium and fats, and appropriate energy expenditure required for the weight goal.;Weight Management: Provide education and appropriate resources to help participant work on and attain dietary goals.;Weight Management/Obesity: Establish reasonable short term and long term weight goals.;Obesity: Provide education and appropriate resources to  help participant work on and attain dietary goals.    Admit Weight 178 lb 4.8 oz (80.9 kg)    Goal Weight: Short Term 173 lb (78.5 kg)    Goal Weight: Long Term 168 lb (76.2 kg)    Expected Outcomes Short Term: Continue to assess and modify interventions until short term weight is achieved;Long Term: Adherence to nutrition and physical activity/exercise program aimed toward attainment of established weight goal;Weight Loss: Understanding of general recommendations for a balanced deficit meal plan, which  promotes 1-2 lb weight loss per week and includes a negative energy balance of [phone] kcal/d;Understanding recommendations for meals to include 15-35% energy as protein, 25-35% energy from fat, 35-60% energy from carbohydrates, less than 261m of dietary cholesterol, 20-35 gm of total fiber daily;Understanding of distribution of calorie intake throughout the day with the consumption of 4-5 meals/snacks    Tobacco Cessation Yes    Number of packs per day 2 cigs per day    Intervention Assist the participant in steps to quit. Provide individualized education and counseling about committing to Tobacco Cessation, relapse prevention, and pharmacological support that can be provided by physician.;OAdvice worker assist with locating and accessing local/national Quit Smoking programs, and support quit date choice.    Expected Outcomes Short Term: Will demonstrate readiness to quit, by selecting a quit date.;Short Term: Will quit all tobacco product use, adhering to prevention of relapse plan.;Long Term: Complete abstinence from all tobacco products for at least 12 months from quit date.    Hypertension Yes    Intervention Provide education on lifestyle modifcations including regular physical activity/exercise, weight management, moderate sodium restriction and increased consumption of fresh fruit, vegetables, and low fat dairy, alcohol moderation, and smoking cessation.;Monitor prescription use  compliance.    Expected Outcomes Short Term: Continued assessment and intervention until BP is < 140/939mHG in hypertensive participants. < 130/808mG in hypertensive participants with diabetes, heart failure or chronic kidney disease.;Long Term: Maintenance of blood pressure at goal levels.    Lipids Yes    Intervention Provide education and support for participant on nutrition & aerobic/resistive exercise along with prescribed medications to achieve LDL <36m39mDL >40mg35m Expected Outcomes Long Term: Cholesterol controlled with medications as prescribed, with individualized exercise RX and with personalized nutrition plan. Value goals: LDL < 36mg,45m > 40 mg.;Short Term: Participant states understanding of desired cholesterol values and is compliant with medications prescribed. Participant is following exercise prescription and nutrition guidelines.           Education:Diabetes - Individual verbal and written instruction to review signs/symptoms of diabetes, desired ranges of glucose level fasting, after meals and with exercise. Acknowledge that pre and post exercise glucose checks will be done for 3 sessions at entry of program.   Education: Know Your Numbers and Risk Factors: -Group verbal and written instruction about important numbers in your health.  Discussion of what are risk factors and how they play a role in the disease process.  Review of Cholesterol, Blood Pressure, Diabetes, and BMI and the role they play in your overall health.   Core Components/Risk Factors/Patient Goals Review:   Goals and Risk Factor Review    Row Name 04/02/20 1137 04/25/20 1136 05/19/20 1137         Core Components/Risk Factors/Patient Goals Review   Personal Goals Review Weight Management/Obesity;Hypertension;Lipids;Tobacco Cessation Weight Management/Obesity;Hypertension;Lipids;Tobacco Cessation Weight Management/Obesity;Hypertension;Lipids;Tobacco Cessation     Review LeslieLenishaking meds as  directed.  She sees her Dr Monday and plans to ask about the patch or other options.  She goes back to work Tuesday.  She does check BP at home or the [employer] on Chantix and she has dropped to 7 cigs/day.  She is not ready to set a quit date, but wants to just let it happen on its own.  She is in the mindset now to quit.  Her weight is staying steady so we talked about getting in more exercise.  She is  trying to do better with her diet and healthier options for snacks.  Her blood pressures are doing well and she is still checking them at home. Meliss is no longer using Chantix due to side effects of making her sick to her stomach. She does have some nicotine patches and is planning to use those in her effort to continue to work towards quitting smoking. She continues to smoke 7-10 cigarettes a day, which is a big decrease from her previous numbers. Her doctor took her off her BP medication and for the most part she reports acceptable BP reading. She continues to take all other medications prescribed from her doctor.     Expected Outcomes Short:  follow up with Dr about smoking Long:  quit smoking Short: Continue work towards quitting smoking  Long: Continue to monitor risk factors. Short: Start using nicotine patches to help with continued goal of  quitting smoking. Long: Continue to monitor risk factors.            Core Components/Risk Factors/Patient Goals at Discharge (Final Review):   Goals and Risk Factor Review - 05/19/20 1137      Core Components/Risk Factors/Patient Goals Review   Personal Goals Review Weight Management/Obesity;Hypertension;Lipids;Tobacco Cessation    Review Kerrianne is no longer using Chantix due to side effects of making her sick to her stomach. She does have some nicotine patches and is planning to use those in her effort to continue to work towards quitting smoking. She continues to smoke 7-10 cigarettes a day, which is a big decrease from her previous  numbers. Her doctor took her off her BP medication and for the most part she reports acceptable BP reading. She continues to take all other medications prescribed from her doctor.    Expected Outcomes Short: Start using nicotine patches to help with continued goal of  quitting smoking. Long: Continue to monitor risk factors.           ITP Comments:  ITP Comments    Row Name 02/26/20 1305 02/28/20 1241 03/04/20 1148 03/18/20 1223 03/19/20 0520   ITP Comments Virtual Orientation performed. Patient informed when to come in for RD and EP orientation. Diagnosis can be found in [facility] 01/16/2020. Completed 6MWT and gym orientation.  Initial ITP created and sent for review to Dr. Caryl Comes covering for Dr. Emily Filbert, Medical Director. First full day of exercise!  Patient was oriented to gym and equipment including functions, settings, policies, and procedures.  Patient's individual exercise prescription and treatment plan were reviewed.  All starting workloads were established based on the results of the 6 minute walk test done at initial orientation visit.  The plan for exercise progression was also introduced and progression will be customized based on patient's performance and goals. Completed Initial RD Eval 30 Day review completed. Medical Director review done, changes made as directed,and approval shown by signature of Market researcher.   [place] Name 04/16/20 0539 05/14/20 0540 06/04/20 1115       ITP Comments 30 Day review completed. ITP review done, changes made as directed,and approval shown by signature of  Scientist, research (life sciences). 30 Day review completed. Medical Director ITP review done, changes made as directed, and signed approval by Medical Director. Dustyn graduated today from  rehab with 36 sessions completed.  Details of the patient's exercise prescription and what She needs to do in order to continue the prescription and progress were discussed with patient.  Patient was given a copy of  prescription and goals.  Patient verbalized understanding.  Kea plans to continue to exercise by walking at home and doing staff videos.            Comments: Discharge ITP

## 2020-06-26 ENCOUNTER — Other Ambulatory Visit: Payer: Self-pay | Admitting: Cardiovascular Disease

## 2020-06-26 NOTE — Telephone Encounter (Signed)
Call to patient to make sure she needs a refill before I message MD.  No answer, left message for patient.

## 2020-06-26 NOTE — Telephone Encounter (Signed)
Please review for refill on Chantix. Thanks!

## 2020-08-11 ENCOUNTER — Other Ambulatory Visit: Payer: Self-pay | Admitting: Family

## 2020-08-11 DIAGNOSIS — E785 Hyperlipidemia, unspecified: Secondary | ICD-10-CM

## 2020-10-06 NOTE — Progress Notes (Signed)
Cardiology Office Note  Date:  10/07/2020   ID:  Ilean Skill, DOB 1966-03-12, MRN [id number]  PCP:  Patient, No Pcp Per   Chief Complaint  Patient presents with   office visit    6 month F/U; Meds verbally reviewed with patient.    HPI:  Victoria Avery is a 54 y.o. female with a hx  Of malignant hypertension Of smoking, who continues to smoke 1 pack/day CAD, CABG x3,  Feb 2021 Who presents for f/u of her CAD, CABG  Works at [employer] improvement Some stress at work Stress coming into the office today, blood pressure elevated Used to be on lisinopril HCTZ, reports this was stopped after bypass has not been restarted Continues to take her metoprolol daily with Lipitor and aspirin 325  Denies any chest pain concerning for angina Smokes 1/2 pack/day, no regular exercise Husband does not smoke  Does not have primary care physician  Completed cardiac rehab July 2021  EKG personally reviewed by myself on todays visit Shows normal sinus rhythm rate 95 bpm T wave abnormality V4 through V6 nonspecific ST-T wave abnormality 1, 2 3 aVF, unchanged from May  Past medical history reviewed  [facility] 01/13/2020 reporting chest pain.   ED EKG showed ST dependent depression with positive troponin.  Ruled in for NSTEMI.    Cardiac cath 01/14/2020 with mildly reduced LVEF and multivessel CAD.   Transferred to [facility] for CABG.   Underwent CABG with LIMA to LAD, R SVG to diagonal 1, SVG to PDA.  discharged 01/21/2020.  Cardiac cath 01/14/20  There is mild left ventricular systolic dysfunction.  LV end diastolic pressure is normal.  The left ventricular ejection fraction is 45-50% by visual estimate.  Prox RCA lesion is 90% stenosed.  Prox RCA to Mid RCA lesion is 100% stenosed.  Mid Cx to Dist Cx lesion is 99% stenosed.  Mid LAD-1 lesion is 90% stenosed.  Mid LAD-2 lesion is 60% stenosed.  1st Diag-1 lesion is 85% stenosed.  1st Diag-2 lesion is 70% stenosed.  1.  Severe three-vessel coronary artery disease with chronic occlusion of the RCA with left-to-right collaterals, subtotal occlusion of the distal left circumflex supplying small OM branches and significant mid LAD stenosis and significant disease in ostial large first diagonal. OM branches are likely too small to graft. 2. Mildly reduced LV systolic function with an EF of 45 to 50% with inferior wall hypokinesis. 3. Normal left ventricular end-diastolic pressure.  Echo 01/14/20 1. Left ventricular ejection fraction, by estimation, is 50 to 55%. The  left ventricle has low normal function. LV endocardial border not  optimally defined to evaluate regional wall motion. Left ventricular  diastolic parameters were normal.  2. Right ventricular systolic function is normal. The right ventricular  size is normal. Tricuspid regurgitation signal is inadequate for assessing  PA pressure.   Echo2/15/2021 Left ventricular ejection fraction, by estimation, is 50 to 55%. The  left ventricle has low normal function. LV endocardial border not  optimally defined to evaluate regional wall motion. Left ventricular  diastolic parameters were normal.    PMH:   has a past medical history of Cardiomyopathy (HCC), Hypertension, and Tobacco abuse.  PSH:    Past Surgical History:  Procedure Laterality Date   CARDIAC CATHETERIZATION     CESAREAN SECTION     CORONARY ARTERY BYPASS GRAFT N/A 01/18/2020   Procedure: OFF PUMP CORONARY ARTERY BYPASS GRAFTING (CABG) times three using left internal mammary artery and right greater  saphenous vein harvested endoscopically.;  Surgeon: Corliss Skains, MD;  Location: [facility] OR;  Service: Open Heart Surgery;  Laterality: N/A;   LEFT HEART CATH AND CORONARY ANGIOGRAPHY N/A 01/14/2020   Procedure: LEFT HEART CATH AND CORONARY ANGIOGRAPHY;  Surgeon: Iran Ouch, MD;  Location: [facility] INVASIVE CV LAB;  Service: Cardiovascular;  Laterality: N/A;   TEE WITHOUT  CARDIOVERSION N/A 01/18/2020   Procedure: TRANSESOPHAGEAL ECHOCARDIOGRAM (TEE);  Surgeon: Corliss Skains, MD;  Location: [facility] OR;  Service: Open Heart Surgery;  Laterality: N/A;    Current Outpatient Medications  Medication Sig Dispense Refill   acetaminophen (TYLENOL) 500 MG tablet Take 2 tablets (1,000 mg total) by mouth every 6 (six) hours as needed. 30 tablet 0   aspirin EC 325 MG EC tablet Take 1 tablet (325 mg total) by mouth daily. 30 tablet 0   atorvastatin (LIPITOR) 80 MG tablet TAKE 1 TABLET (80 MG TOTAL) BY MOUTH DAILY AT 6 PM. 90 tablet 0   metoprolol succinate (TOPROL-XL) 50 MG 24 hr tablet Take 1 tablet (50 mg total) by mouth daily. Take with or immediately following a meal. 90 tablet 3   nitroGLYCERIN (NITROSTAT) 0.4 MG SL tablet Place 1 tablet (0.4 mg total) under the tongue every 5 (five) minutes as needed for chest pain. 25 tablet 3   No current facility-administered medications for this visit.     Allergies:   Patient has no known allergies.   Social History:  The patient  reports that she has been smoking cigarettes. She has a 15.00 pack-year smoking history. She has never used smokeless tobacco. She reports current alcohol use. She reports that she does not use drugs.   Family History:   family history includes CAD in her mother; Hypertension in her mother.    Review of Systems: Review of Systems  Constitutional: Negative.   HENT: Negative.   Respiratory: Negative.   Cardiovascular: Negative.   Gastrointestinal: Negative.   Musculoskeletal: Negative.   Neurological: Negative.   Psychiatric/Behavioral: The patient is nervous/anxious.   All other systems reviewed and are negative.   PHYSICAL EXAM: VS:  BP (!) 180/120 (BP Location: Left Arm, Patient Position: Sitting, Cuff Size: Normal)    Pulse 94    Ht 5\' 2"  (1.575 m)    Wt 183 lb (83 kg)    LMP 11/25/2017    SpO2 98%    BMI 33.47 kg/m  , BMI Body mass index is 33.47 kg/m. Constitutional:  oriented  to person, place, and time. No distress.  HENT:  Head: Grossly normal Eyes:  no discharge. No scleral icterus.  Neck: No JVD, no carotid bruits  Cardiovascular: Regular rate and rhythm, no murmurs appreciated Pulmonary/Chest: Clear to auscultation bilaterally, no wheezes or rails Abdominal: Soft.  no distension.  no tenderness.  Musculoskeletal: Normal range of motion Neurological:  normal muscle tone. Coordination normal. No atrophy Skin: Skin warm and dry Psychiatric: normal affect, pleasant  Recent Labs: 01/19/2020: Magnesium 1.6 01/21/2020: BUN 7; Creatinine, Ser 0.92; Hemoglobin 9.9; Platelets 201; Potassium 4.5; Sodium 136 04/07/2020: ALT 26    Lipid Panel Lab Results  Component Value Date   CHOL 130 04/07/2020   HDL 28 (L) 04/07/2020   LDLCALC 67 04/07/2020   TRIG 207 (H) 04/07/2020      Wt Readings from Last 3 Encounters:  10/07/20 183 lb (83 kg)  04/07/20 173 lb 8 oz (78.7 kg)  02/28/20 178 lb 4.8 oz (80.9 kg)       ASSESSMENT AND  PLAN:  CAD with stable angina S/p CABG Completed cardiac rehab Stressed importance of staying on her Lipitor, long time spent on smoking cessation, She has borderline glucose levels discussed lifestyle modification exercise program and diet modification  Smoker Could not tolerate Chantix this caused nausea Causing her to cough Discussed need to try other modalities, patches, lozenges, gum Husband does not smoke  Hyperlipidemia 6 months ago lipids at goal, recommended weight loss  Essential hypertension Blood pressure elevated we will restart lisinopril HCTZ 20/12.5 daily with titration upwards Suggested she call us with blood pressure numbers in the next week or 2 Would likely need to go higher on the lisinopril HCTZ  Shortness of breath Stable, secondary to weight, deconditioning, smoking history  Anxiety Anxious today for unclear reasons, tearful at times   Total encounter time more than 25 minutes  Greater than 50%  was spent in counseling and coordination of care with the patient    No orders of the defined types were placed in this encounter.    Signed, Dossie Arbour, M.D., Ph.D. 10/07/2020  [facility] [place], [place] [phone]

## 2020-10-07 ENCOUNTER — Telehealth: Payer: Self-pay | Admitting: Cardiovascular Disease

## 2020-10-07 ENCOUNTER — Encounter: Payer: Self-pay | Admitting: Cardiovascular Disease

## 2020-10-07 ENCOUNTER — Other Ambulatory Visit: Payer: Self-pay

## 2020-10-07 ENCOUNTER — Ambulatory Visit (INDEPENDENT_AMBULATORY_CARE_PROVIDER_SITE_OTHER): Payer: BC Managed Care – PPO | Admitting: Cardiovascular Disease

## 2020-10-07 VITALS — BP 190/100 | HR 94 | Ht 62.0 in | Wt 183.0 lb

## 2020-10-07 DIAGNOSIS — I10 Essential (primary) hypertension: Secondary | ICD-10-CM

## 2020-10-07 DIAGNOSIS — E785 Hyperlipidemia, unspecified: Secondary | ICD-10-CM

## 2020-10-07 DIAGNOSIS — I25118 Atherosclerotic heart disease of native coronary artery with other forms of angina pectoris: Secondary | ICD-10-CM

## 2020-10-07 DIAGNOSIS — I429 Cardiomyopathy, unspecified: Secondary | ICD-10-CM

## 2020-10-07 DIAGNOSIS — F17200 Nicotine dependence, unspecified, uncomplicated: Secondary | ICD-10-CM | POA: Diagnosis not present

## 2020-10-07 DIAGNOSIS — Z951 Presence of aortocoronary bypass graft: Secondary | ICD-10-CM

## 2020-10-07 MED ORDER — ASPIRIN EC 81 MG PO TBEC: 81.0000 mg | DELAYED_RELEASE_TABLET | Freq: Every day | ORAL | 3 refills | Status: AC

## 2020-10-07 MED ORDER — LISINOPRIL-HYDROCHLOROTHIAZIDE 20-12.5 MG PO TABS: 1.0000 | ORAL_TABLET | Freq: Every day | ORAL | 3 refills | Status: DC

## 2020-10-07 NOTE — Progress Notes (Signed)
Spoke with patient regarding her elevated blood pressures and she is going to go home, take medication, obtain blood pressure 2 hours after medication then call back with her blood pressure. She was agreeable with this plan with no further questions at this time.

## 2020-10-07 NOTE — Patient Instructions (Addendum)
Primary care: Provided phone # to call for assistance finding primary care provider.   Medication Instructions:   Please decrease the asa down to 81 mg daily  Please restart lisinopril HCTZ 20/12.5 daily  Monitor pressures, call with numbers  Please monitor blood pressures and keep a log of your readings.  Make sure to check 2 hours after your medications.   AVOID these things for 30 minutes before checking your blood pressure:  Drinking caffeine.  Drinking alcohol.  Eating.  Smoking.  Exercising.   Five minutes before checking your blood pressure:  Pee.  Sit in a dining chair. Avoid sitting in a soft couch or armchair.  Be quiet. Do not talk.    If you need a refill on your cardiac medications before your next appointment, please call your pharmacy.    Lab work: No new labs needed   If you have labs (blood work) drawn today and your tests are completely normal, you will receive your results only by: Marland Kitchen MyChart Message (if you have MyChart) OR . A paper copy in the mail If you have any lab test that is abnormal or we need to change your treatment, we will call you to review the results.   Testing/Procedures: No new testing needed   Follow-Up: At [facility], you and your health needs are our priority.  As part of our continuing mission to provide you with exceptional heart care, we have created designated Provider Care Teams.  These Care Teams include your primary Cardiologist (physician) and Advanced Practice Providers (APPs -  Physician Assistants and Nurse Practitioners) who all work together to provide you with the care you need, when you need it.  . You will need a follow up appointment in 6 months  . Providers on your designated Care Team:   . Nicolasa Ducking, NP . Eula Listen, PA-C . Marisue Ivan, PA-C  Any Other Special Instructions Will Be Listed Below (If Applicable).  COVID-19 Vaccine Information can be found at:  PodExchange.nl For questions related to vaccine distribution or appointments, please email [email or web address] or call [phone].

## 2020-10-07 NOTE — Telephone Encounter (Signed)
Patient is calling back with her BP 123/78.

## 2020-10-08 NOTE — Telephone Encounter (Signed)
This call came in yesterday morning after she went home from her office visit that was at 8:00 am with Dr. Mariah Milling.  To Dr. Mariah Milling to review.

## 2020-11-08 ENCOUNTER — Other Ambulatory Visit: Payer: Self-pay | Admitting: Cardiovascular Disease

## 2020-11-08 DIAGNOSIS — E785 Hyperlipidemia, unspecified: Secondary | ICD-10-CM

## 2020-11-10 NOTE — Telephone Encounter (Signed)
Rx request sent to pharmacy.  

## 2021-04-30 ENCOUNTER — Other Ambulatory Visit: Payer: Self-pay | Admitting: Cardiovascular Disease

## 2021-04-30 DIAGNOSIS — I25118 Atherosclerotic heart disease of native coronary artery with other forms of angina pectoris: Secondary | ICD-10-CM

## 2021-04-30 NOTE — Telephone Encounter (Signed)
Scheduled 06/21

## 2021-04-30 NOTE — Telephone Encounter (Signed)
Pt overdue for 6 month f/u.  Please contact pt for future appointment. 

## 2021-05-19 ENCOUNTER — Other Ambulatory Visit: Payer: Self-pay

## 2021-05-19 ENCOUNTER — Encounter: Payer: Self-pay | Admitting: Cardiovascular Disease

## 2021-05-19 ENCOUNTER — Ambulatory Visit: Payer: BC Managed Care – PPO | Admitting: Cardiovascular Disease

## 2021-05-19 VITALS — BP 130/90 | HR 96 | Ht 62.0 in | Wt 187.5 lb

## 2021-05-19 DIAGNOSIS — I429 Cardiomyopathy, unspecified: Secondary | ICD-10-CM

## 2021-05-19 DIAGNOSIS — F17200 Nicotine dependence, unspecified, uncomplicated: Secondary | ICD-10-CM | POA: Diagnosis not present

## 2021-05-19 DIAGNOSIS — Z13220 Encounter for screening for lipoid disorders: Secondary | ICD-10-CM

## 2021-05-19 DIAGNOSIS — I25118 Atherosclerotic heart disease of native coronary artery with other forms of angina pectoris: Secondary | ICD-10-CM

## 2021-05-19 DIAGNOSIS — I10 Essential (primary) hypertension: Secondary | ICD-10-CM | POA: Diagnosis not present

## 2021-05-19 DIAGNOSIS — Z951 Presence of aortocoronary bypass graft: Secondary | ICD-10-CM

## 2021-05-19 NOTE — Progress Notes (Signed)
Cardiology Office Note  Date:  05/19/2021   ID:  Victoria Avery, DOB [birth year], MRN [id number]  PCP:  Patient, No Pcp Per (Inactive)   Chief Complaint  Patient presents with   6 month follow up     "Doing well." Medications reviewed by the patient verbally.     HPI:  Victoria Avery is a 55 y.o. female with a past medical history of malignant hypertension smoking, who continues to smoke 1 pack/day CAD, CABG x3,  Feb 2021 Who presents for f/u of her CAD, CABG  Works at [employer] improvement Active, denies any anginal symptoms, no shortness of breath on exertion  Continues to smoke 1/2 ppd Difficult time quiting Tried vapors, had heavy coughing  Blood pressure well controlled  Does not have primary care physician  Completed cardiac rehab July 2021  EKG personally reviewed by myself on todays visit Normal sinus rhythm rate 96 bpm nonspecific ST-T wave abnormality, no change from prior EKGs  Past medical history reviewed  [facility] 01/13/2020 reporting chest pain.   ED EKG showed ST dependent depression with positive troponin.  Ruled in for NSTEMI.    Cardiac cath 01/14/2020 with mildly reduced LVEF and multivessel CAD.   Transferred to [facility] for [facility]G.   Underwent CABG with LIMA to LAD, R SVG to diagonal 1, SVG to PDA.  discharged 01/21/2020.  Cardiac cath 01/14/20 There is mild left ventricular systolic dysfunction. LV end diastolic pressure is normal. The left ventricular ejection fraction is 45-50% by visual estimate. Prox RCA lesion is 90% stenosed. Prox RCA to Mid RCA lesion is 100% stenosed. Mid Cx to Dist Cx lesion is 99% stenosed. Mid LAD-1 lesion is 90% stenosed. Mid LAD-2 lesion is 60% stenosed. 1st Diag-1 lesion is 85% stenosed. 1st Diag-2 lesion is 70% stenosed.   1.  Severe three-vessel coronary artery disease with chronic occlusion of the RCA with left-to-right collaterals, subtotal occlusion of the distal left circumflex supplying small OM  branches and significant mid LAD stenosis and significant disease in ostial large first diagonal.  OM branches are likely too small to graft. 2.  Mildly reduced LV systolic function with an EF of 45 to 50% with inferior wall hypokinesis. 3.  Normal left ventricular end-diastolic pressure.  Echo 01/14/20  1. Left ventricular ejection fraction, by estimation, is 50 to 55%. The  left ventricle has low normal function. LV endocardial border not  optimally defined to evaluate regional wall motion. Left ventricular  diastolic parameters were normal.   2. Right ventricular systolic function is normal. The right ventricular  size is normal. Tricuspid regurgitation signal is inadequate for assessing  PA pressure.   Echo2/15/2021 Left ventricular ejection fraction, by estimation, is 50 to 55%. The  left ventricle has low normal function. LV endocardial border not  optimally defined to evaluate regional wall motion. Left ventricular  diastolic parameters were normal.    PMH:   has a past medical history of Cardiomyopathy (HCC), Hypertension, and Tobacco abuse.  PSH:    Past Surgical History:  Procedure Laterality Date   CARDIAC CATHETERIZATION     CESAREAN SECTION     CORONARY ARTERY BYPASS GRAFT N/A 01/18/2020   Procedure: OFF PUMP CORONARY ARTERY BYPASS GRAFTING (CABG) times three using left internal mammary artery and right greater saphenous vein harvested endoscopically.;  Surgeon: Corliss Skains, MD;  Location: [facility] OR;  Service: Open Heart Surgery;  Laterality: N/A;   LEFT HEART CATH AND CORONARY ANGIOGRAPHY N/A 01/14/2020   Procedure:  LEFT HEART CATH AND CORONARY ANGIOGRAPHY;  Surgeon: Iran Ouch, MD;  Location: [facility] INVASIVE CV LAB;  Service: Cardiovascular;  Laterality: N/A;   TEE WITHOUT CARDIOVERSION N/A 01/18/2020   Procedure: TRANSESOPHAGEAL ECHOCARDIOGRAM (TEE);  Surgeon: Corliss Skains, MD;  Location: [facility] OR;  Service: Open Heart Surgery;  Laterality: N/A;     Current Outpatient Medications  Medication Sig Dispense Refill   acetaminophen (TYLENOL) 500 MG tablet Take 2 tablets (1,000 mg total) by mouth every 6 (six) hours as needed. 30 tablet 0   aspirin EC 81 MG tablet Take 1 tablet (81 mg total) by mouth daily. Swallow whole. 90 tablet 3   atorvastatin (LIPITOR) 80 MG tablet TAKE 1 TABLET (80 MG TOTAL) BY MOUTH DAILY AT 6 PM. 90 tablet 3   lisinopril-hydrochlorothiazide (ZESTORETIC) 20-12.5 MG tablet Take 1 tablet by mouth daily. 90 tablet 3   metoprolol succinate (TOPROL-XL) 50 MG 24 hr tablet TAKE 1 TABLET (50 MG TOTAL) BY MOUTH DAILY. TAKE WITH OR IMMEDIATELY FOLLOWING A MEAL. 90 tablet 0   nitroGLYCERIN (NITROSTAT) 0.4 MG SL tablet Place 1 tablet (0.4 mg total) under the tongue every 5 (five) minutes as needed for chest pain. 25 tablet 3   No current facility-administered medications for this visit.     Allergies:   Patient has no known allergies.   Social History:  The patient  reports that she has been smoking cigarettes. She has a 15.00 pack-year smoking history. She has never used smokeless tobacco. She reports current alcohol use. She reports that she does not use drugs.   Family History:   family history includes CAD in her mother; Hypertension in her mother.    Review of Systems: Review of Systems  Constitutional: Negative.   HENT: Negative.    Respiratory: Negative.    Cardiovascular: Negative.   Gastrointestinal: Negative.   Musculoskeletal: Negative.   Neurological: Negative.   Psychiatric/Behavioral:  The patient is nervous/anxious.   All other systems reviewed and are negative.  PHYSICAL EXAM: VS:  BP 130/90 (BP Location: Left Arm, Patient Position: Sitting, Cuff Size: Normal)   Pulse 96   Ht 5\' 2"  (1.575 m)   Wt 187 lb 8 oz (85 kg)   LMP 11/25/2017   SpO2 98%   BMI 34.29 kg/m  , BMI Body mass index is 34.29 kg/m. Constitutional:  oriented to person, place, and time. No distress.  HENT:  Head: Grossly  normal Eyes:  no discharge. No scleral icterus.  Neck: No JVD, no carotid bruits  Cardiovascular: Regular rate and rhythm, no murmurs appreciated Pulmonary/Chest: Clear to auscultation bilaterally, no wheezes or rails Abdominal: Soft.  no distension.  no tenderness.  Musculoskeletal: Normal range of motion Neurological:  normal muscle tone. Coordination normal. No atrophy Skin: Skin warm and dry Psychiatric: normal affect, pleasant  Recent Labs: No results found for requested labs within last 8760 hours.    Lipid Panel Lab Results  Component Value Date   CHOL 130 04/07/2020   HDL 28 (L) 04/07/2020   LDLCALC 67 04/07/2020   TRIG 207 (H) 04/07/2020      Wt Readings from Last 3 Encounters:  05/19/21 187 lb 8 oz (85 kg)  10/07/20 183 lb (83 kg)  04/07/20 173 lb 8 oz (78.7 kg)       ASSESSMENT AND PLAN: CAD with stable angina S/p CABG Completed cardiac rehab -Smoking cessation recommended, discussed in detail -Cholesterol at goal, orders placed for repeat lab work today  Smoker Could not tolerate  Chantix this caused nausea Vapor cigarettes caused cough Discussed other modalities  Hyperlipidemia Lifestyle modification recommended, weight trending upwards, Lipids order placed, pending from today  Essential hypertension Blood pressure is well controlled on today's visit. No changes made to the medications.  Shortness of breath Stable, secondary to weight, deconditioning, smoking history Recommend weight loss, walking program    Total encounter time more than 25 minutes  Greater than 50% was spent in counseling and coordination of care with the patient    No orders of the defined types were placed in this encounter.    Signed, Dossie Arbour, M.D., Ph.D. 05/19/2021  [facility] [place], [place] [phone]

## 2021-05-19 NOTE — Patient Instructions (Addendum)
Medication Instructions:  No changes  If you need a refill on your cardiac medications before your next appointment, please call your pharmacy.    Lab work: lipids, LFTs LABS WILL APPEAR ON MYCHART, ABNORMAL RESULTS WILL BE CALLED  Testing/Procedures: No new testing needed  Follow-Up: At [facility], you and your health needs are our priority.  As part of our continuing mission to provide you with exceptional heart care, we have created designated Provider Care Teams.  These Care Teams include your primary Cardiologist (physician) and Advanced Practice Providers (APPs -  Physician Assistants and Nurse Practitioners) who all work together to provide you with the care you need, when you need it.  You will need a follow up appointment in 12 months  Providers on your designated Care Team:   Nicolasa Ducking, NP Eula Listen, PA-C Marisue Ivan, PA-C  Any Other Special Instructions Will Be Listed Below (If Applicable).  COVID-19 Vaccine Information can be found at: PodExchange.nl For questions related to vaccine distribution or appointments, please email [email or web address] or call [phone].

## 2021-05-20 LAB — LIPID PANEL
Chol/HDL Ratio: 5.2 ratio — ABNORMAL HIGH (ref 0.0–4.4)
Cholesterol, Total: 181 mg/dL (ref 100–199)
HDL: 35 mg/dL — ABNORMAL LOW (ref >39)
LDL Chol Calc (NIH): 99 mg/dL (ref 0–99)
Triglycerides: 279 mg/dL — ABNORMAL HIGH (ref 0–149)
VLDL Cholesterol Cal: 47 mg/dL — ABNORMAL HIGH (ref 5–40)

## 2021-05-20 LAB — HEPATIC FUNCTION PANEL
ALT: 18 IU/L (ref 0–32)
AST: 19 IU/L (ref 0–40)
Albumin: 5 g/dL — ABNORMAL HIGH (ref 3.8–4.9)
Alkaline Phosphatase: 98 IU/L (ref 44–121)
Bilirubin Total: 0.5 mg/dL (ref 0.0–1.2)
Bilirubin, Direct: 0.15 mg/dL (ref 0.00–0.40)
Total Protein: 7.2 g/dL (ref 6.0–8.5)

## 2021-05-26 ENCOUNTER — Telehealth: Payer: Self-pay

## 2021-05-26 NOTE — Telephone Encounter (Signed)
Able to reach pt regarding her recent lab work, Dr. Mariah Milling had a chance to review her results and advised   "Cholesterol numbers are elevated compared to last year  If taking Lipitor daily would add Zetia 10 mg daily  If missing Lipitor dosing, will try to take daily "  Victoria Avery reprots she takes her Lipitor in the evening and occasionally will miss does a few days in a row, but has been trying to "focus to stay on track, all depends on the day I've had". At this time, pt does not want ot add on another pill Zetia, reports. Suggested to call back in if she decided if she wants to try Zetia, suggested setting an alarm as a reminder to take Lipitor. Also advised on diet and exercise to help with cholesterol levels. Victoria Avery verbalized understanding, and is thankful for the phone call. All questions or concerns were address and no additional concerns at this time. Agreeable to plan, will call back for anything further.

## 2021-06-08 ENCOUNTER — Other Ambulatory Visit: Payer: Self-pay | Admitting: Family

## 2021-06-08 DIAGNOSIS — I25118 Atherosclerotic heart disease of native coronary artery with other forms of angina pectoris: Secondary | ICD-10-CM

## 2021-07-26 ENCOUNTER — Other Ambulatory Visit: Payer: Self-pay | Admitting: Cardiovascular Disease

## 2021-07-26 DIAGNOSIS — I25118 Atherosclerotic heart disease of native coronary artery with other forms of angina pectoris: Secondary | ICD-10-CM

## 2021-09-11 ENCOUNTER — Other Ambulatory Visit: Payer: Self-pay | Admitting: Cardiovascular Disease

## 2021-09-11 DIAGNOSIS — E785 Hyperlipidemia, unspecified: Secondary | ICD-10-CM

## 2021-12-27 IMAGING — CR DG CHEST 2V
1 series · 2 of 2 positions shown · non-contrast
Comparison: None.

CLINICAL DATA: Chest pain chest tightness mild shortness of breath

EXAM:
CHEST - 2 VIEW

[Series 1: dg chest 2 view · 0.14mm/px · 2 of 2 slices shown]
[im 1/2]
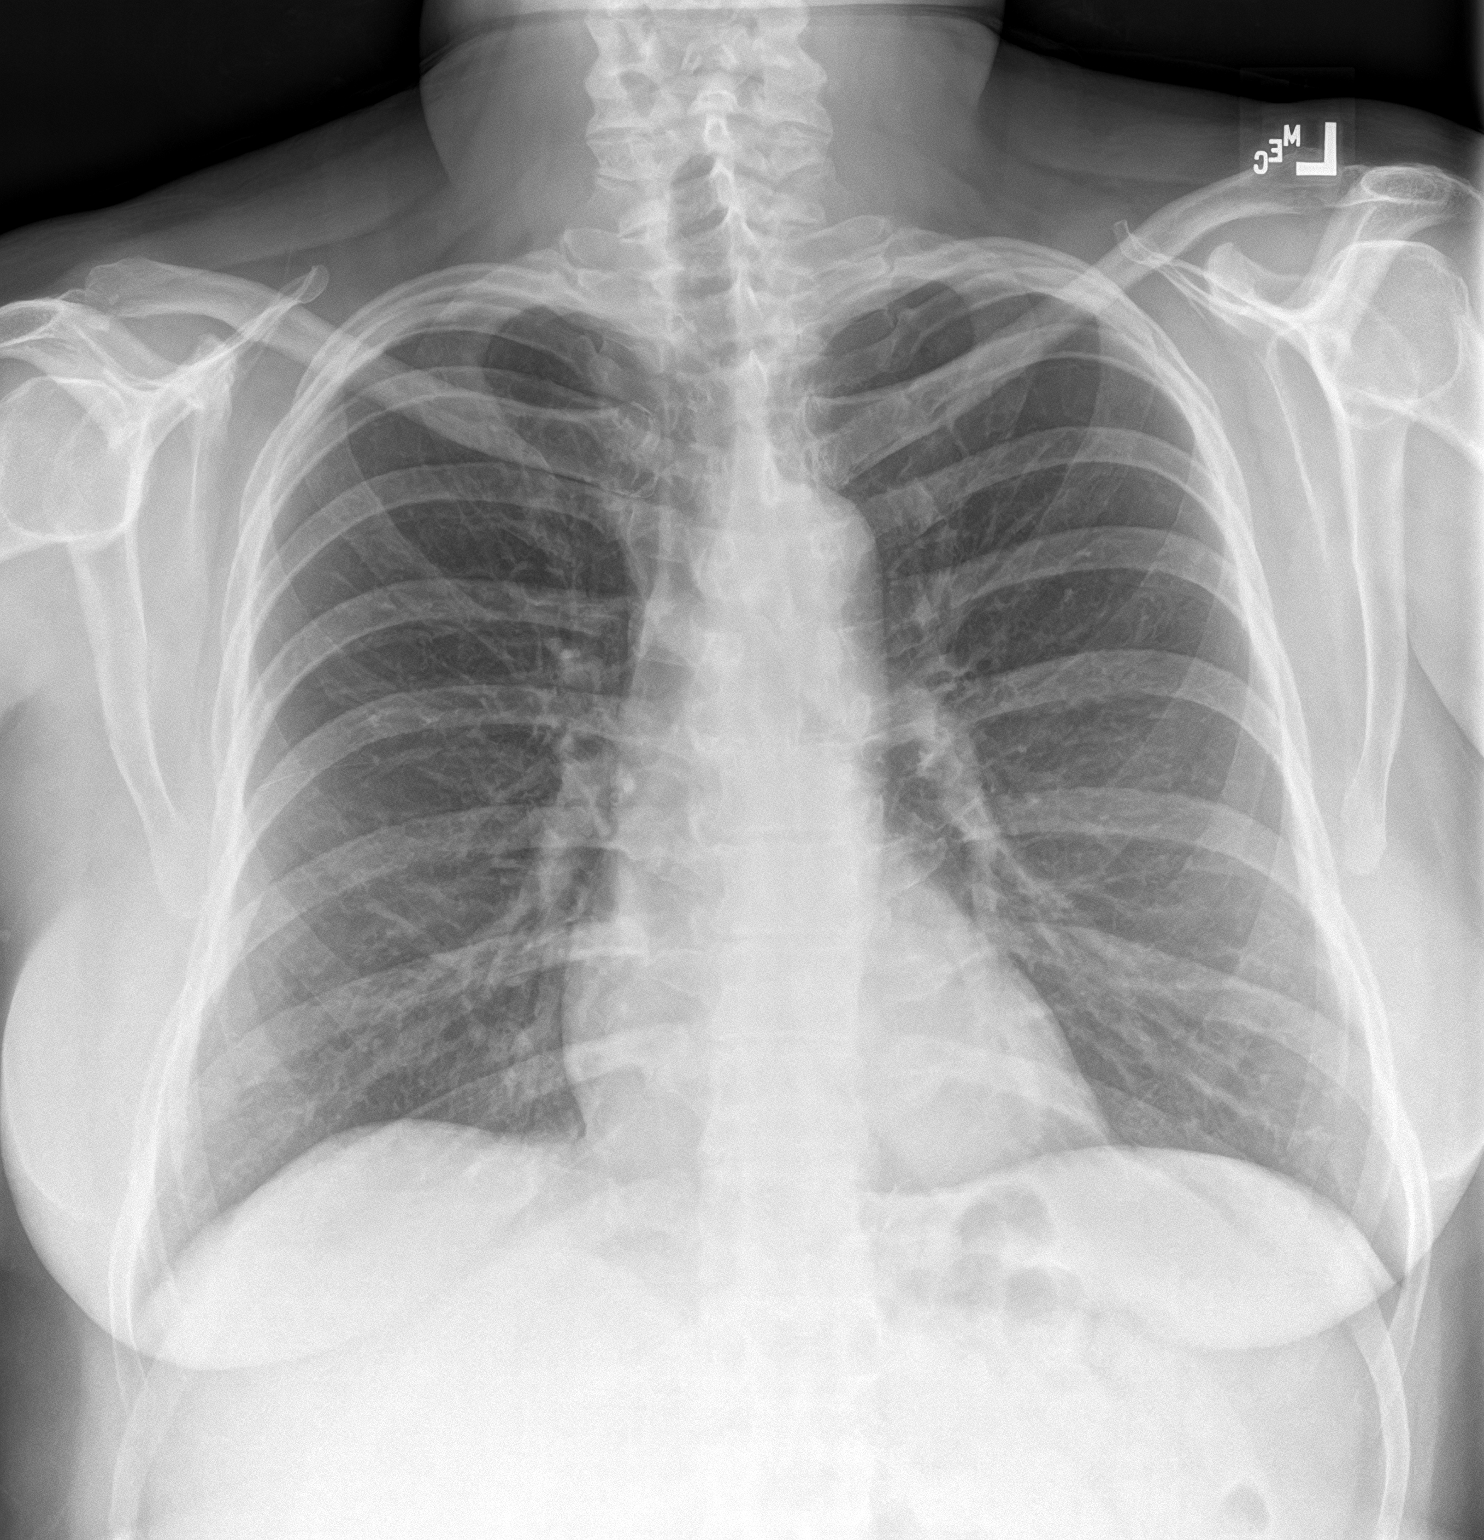
[im 2/2]
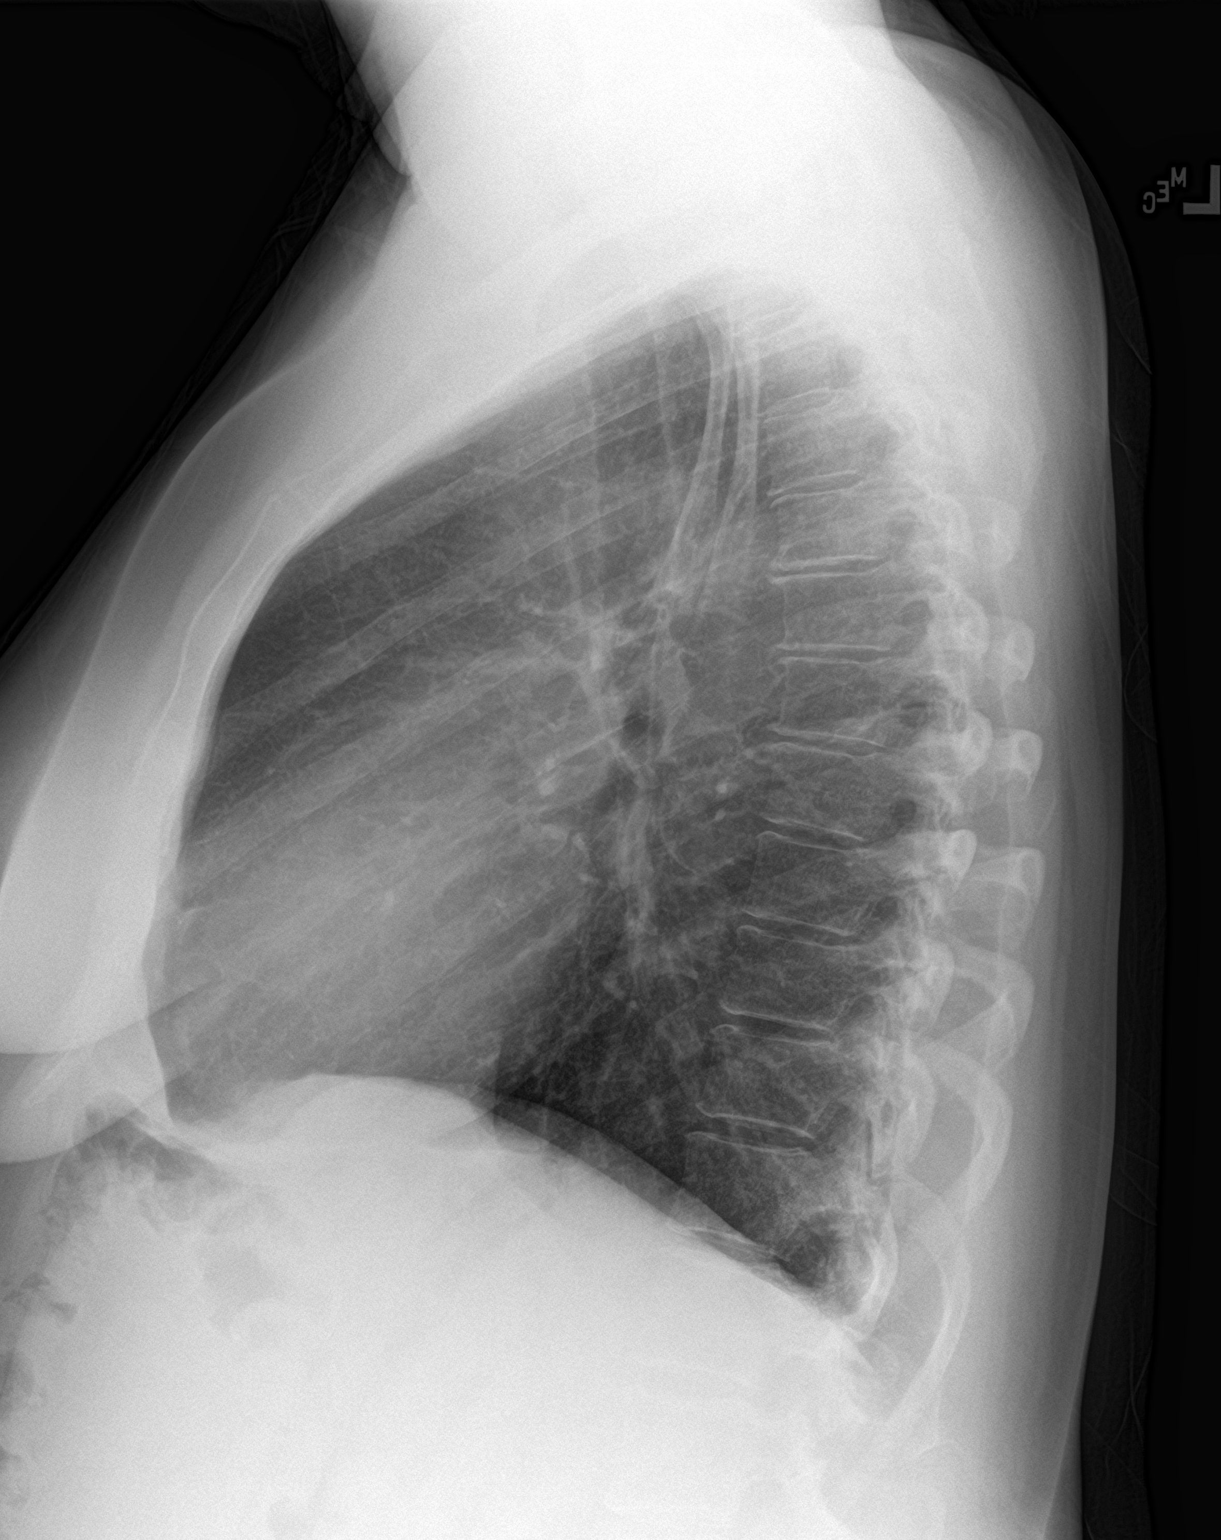

[2 of 2 positions shown; findings below may reference images not displayed]

FINDINGS: The heart size and mediastinal contours are within normal limits.
Both lungs are clear. The visualized skeletal structures are
unremarkable.
IMPRESSION: No active cardiopulmonary disease.

## 2021-12-30 ENCOUNTER — Other Ambulatory Visit: Payer: Self-pay

## 2021-12-30 ENCOUNTER — Emergency Department
Admission: EM | Admit: 2021-12-30 | Discharge: 2021-12-30 | Disposition: A | Discharge status: Home / Self Care | Payer: BC Managed Care – PPO | Attending: Emergency Medicine | Admitting: Emergency Medicine

## 2021-12-30 DIAGNOSIS — R112 Nausea with vomiting, unspecified: Secondary | ICD-10-CM | POA: Diagnosis not present

## 2021-12-30 DIAGNOSIS — R197 Diarrhea, unspecified: Secondary | ICD-10-CM | POA: Diagnosis not present

## 2021-12-30 DIAGNOSIS — E860 Dehydration: Secondary | ICD-10-CM

## 2021-12-30 DIAGNOSIS — R9431 Abnormal electrocardiogram [ECG] [EKG]: Secondary | ICD-10-CM | POA: Diagnosis not present

## 2021-12-30 LAB — URINALYSIS, ROUTINE W REFLEX MICROSCOPIC
Glucose, UA: NEGATIVE mg/dL
Leukocytes,Ua: NEGATIVE
Nitrite: NEGATIVE
Protein, ur: 30 mg/dL — AB
Specific Gravity, Urine: 1.025 (ref 1.005–1.030)
pH: 6 (ref 5.0–8.0)

## 2021-12-30 LAB — COMPREHENSIVE METABOLIC PANEL
ALT: 19 U/L (ref 0–44)
AST: 20 U/L (ref 15–41)
Albumin: 4.4 g/dL (ref 3.5–5.0)
Alkaline Phosphatase: 76 U/L (ref 38–126)
Anion gap: 8 (ref 5–15)
BUN: 13 mg/dL (ref 6–20)
CO2: 26 mmol/L (ref 22–32)
Calcium: 9.3 mg/dL (ref 8.9–10.3)
Chloride: 97 mmol/L — ABNORMAL LOW (ref 98–111)
Creatinine, Ser: 0.75 mg/dL (ref 0.44–1.00)
GFR, Estimated: >60 mL/min (ref >60)
Glucose, Bld: 140 mg/dL — ABNORMAL HIGH (ref 70–99)
Potassium: 3.3 mmol/L — ABNORMAL LOW (ref 3.5–5.1)
Sodium: 131 mmol/L — ABNORMAL LOW (ref 135–145)
Total Bilirubin: 0.8 mg/dL (ref 0.3–1.2)
Total Protein: 7.5 g/dL (ref 6.5–8.1)

## 2021-12-30 LAB — CBC
HCT: 44.7 % (ref 36.0–46.0)
Hemoglobin: 15.5 g/dL — ABNORMAL HIGH (ref 12.0–15.0)
MCH: 32.4 pg (ref 26.0–34.0)
MCHC: 34.7 g/dL (ref 30.0–36.0)
MCV: 93.5 fL (ref 80.0–100.0)
Platelets: 184 10*3/uL (ref 150–400)
RBC: 4.78 MIL/uL (ref 3.87–5.11)
RDW: 12.8 % (ref 11.5–15.5)
WBC: 7 10*3/uL (ref 4.0–10.5)
nRBC: 0 % (ref 0.0–0.2)

## 2021-12-30 LAB — LIPASE, BLOOD: Lipase: 23 U/L (ref 11–51)

## 2021-12-30 MED ORDER — METOPROLOL SUCCINATE ER 50 MG PO TB24
50.0000 mg | ORAL_TABLET | Freq: Every day | ORAL | Status: DC
  Administered 2021-12-30: 50 mg via ORAL
  Filled 2021-12-30: qty 1

## 2021-12-30 MED ORDER — PROMETHAZINE HCL 25 MG PO TABS
12.5000 mg | ORAL_TABLET | Freq: Once | ORAL | Status: AC
  Administered 2021-12-30: 12.5 mg via ORAL
  Filled 2021-12-30: qty 1

## 2021-12-30 MED ORDER — ONDANSETRON 4 MG PO TBDP: 4.0000 mg | ORAL_TABLET | Freq: Three times a day (TID) | ORAL | 0 refills | Status: DC | PRN

## 2021-12-30 MED ORDER — LACTATED RINGERS IV BOLUS
1000.0000 mL | Freq: Once | INTRAVENOUS | Status: AC
  Administered 2021-12-30: 1000 mL via INTRAVENOUS

## 2021-12-30 MED ORDER — LABETALOL HCL 5 MG/ML IV SOLN
10.0000 mg | Freq: Once | INTRAVENOUS | Status: AC
  Administered 2021-12-30: 10 mg via INTRAVENOUS
  Filled 2021-12-30: qty 4

## 2021-12-30 MED ORDER — SODIUM CHLORIDE 0.9 % IV BOLUS
1000.0000 mL | Freq: Once | INTRAVENOUS | Status: AC
  Administered 2021-12-30: 1000 mL via INTRAVENOUS

## 2021-12-30 MED ORDER — LISINOPRIL 10 MG PO TABS
20.0000 mg | ORAL_TABLET | Freq: Once | ORAL | Status: AC
  Administered 2021-12-30: 20 mg via ORAL
  Filled 2021-12-30: qty 2

## 2021-12-30 MED ORDER — PROMETHAZINE HCL 25 MG RE SUPP: 25.0000 mg | Freq: Three times a day (TID) | RECTAL | 0 refills | Status: DC | PRN

## 2021-12-30 MED ORDER — SODIUM CHLORIDE 0.9 % IV SOLN
12.5000 mg | Freq: Four times a day (QID) | INTRAVENOUS | Status: DC | PRN
  Administered 2021-12-30: 12.5 mg via INTRAVENOUS
  Filled 2021-12-30: qty 12.5

## 2021-12-30 MED ORDER — ONDANSETRON HCL 4 MG/2ML IJ SOLN
4.0000 mg | Freq: Once | INTRAMUSCULAR | Status: AC
  Administered 2021-12-30: 4 mg via INTRAVENOUS
  Filled 2021-12-30: qty 2

## 2021-12-30 NOTE — ED Triage Notes (Signed)
Pt comes with c/o nausea and some vomiting. Pt states this started MOnday night. Pt denies any belly pain.  BP elevated pt states she hasn't been able to take her meds since she can't keep anything down.

## 2021-12-30 NOTE — ED Provider Notes (Signed)
[facility] Provider Note    Event Date/Time   First MD Initiated Contact with Patient 12/30/21 1505     (approximate)   History   Nausea and Emesis   HPI  Victoria Avery is a 56 y.o. female here with nausea, vomiting, diarrhea.  The patient states that for the last 4 days or so, she has had progressively worsening, persistent, nausea, vomiting, and diarrhea.  Patient states her symptoms started gradually and she has had persistent loose stools, nausea, and difficulty initially tolerating solid, then liquid, and now any p.o. intake of the last 24 hours.  She denies any associated fevers or chills.  No significant abdominal pain.  She has not tried anything for these symptoms.  She denies known sick contacts or suspicious food intakes.  Her significant other has not had any similar symptoms.  No recent travel.  No recent antibiotic use.  Denies any significant abdominal pain or distention.  She states she has felt weak today because she has had difficulty eating or drinking anything, as well as taking her medications, so she presents for the evaluation.  No chest pain.     Physical Exam   Triage Vital Signs: ED Triage Vitals  Enc Vitals Group     BP 12/30/21 1211 (!) 180/121     Pulse Rate 12/30/21 1211 (!) 102     Resp 12/30/21 1211 18     Temp 12/30/21 1211 98 F (36.7 C)     Temp src --      SpO2 12/30/21 1211 95 %     Weight --      Height --      Head Circumference --      Peak Flow --      Pain Score 12/30/21 1211 2     Pain Loc --      Pain Edu? --      Excl. in GC? --     Most recent vital signs: Vitals:   12/30/21 1626 12/30/21 1735  BP: (!) 165/82 (!) 173/93  Pulse: 82 82  Resp: 18 18  Temp:    SpO2: 96% 97%     General: Awake, no distress.  Mildly dry mucous membranes. CV:  Good peripheral perfusion.  Resp:  Normal effort.  Normal work of breathing.  Lungs are clear to auscultation bilaterally. Abd:  No distention.  No  rebound or guarding.  No tenderness, specifically no left lower quadrant or right lower quadrant tenderness. Other:  Overall well-appearing and in no distress.  Good peripheral perfusion and cap refill.  No rash.   ED Results / Procedures / Treatments   Labs (all labs ordered are listed, but only abnormal results are displayed) Labs Reviewed  COMPREHENSIVE METABOLIC PANEL - Abnormal; Notable for the following components:      Result Value   Sodium 131 (*)    Potassium 3.3 (*)    Chloride 97 (*)    Glucose, Bld 140 (*)    All other components within normal limits  CBC - Abnormal; Notable for the following components:   Hemoglobin 15.5 (*)    All other components within normal limits  URINALYSIS, ROUTINE W REFLEX MICROSCOPIC - Abnormal; Notable for the following components:   APPearance CLEAR (*)    Hgb urine dipstick TRACE (*)    Bilirubin Urine SMALL (*)    Ketones, ur TRACE (*)    Protein, ur 30 (*)    Bacteria, UA RARE (*)  All other components within normal limits  LIPASE, BLOOD     EKG Normal sinus rhythm, ventricular at 81.  PR 132, QRS 92, QTc 448.  No acute ST elevations or depressions.  When compared to prior, T wave inversions are resolved.  No acute ischemic changes.  RADIOLOGY None   PROCEDURES:  Critical Care performed: No     MEDICATIONS ORDERED IN ED: Medications  metoprolol succinate (TOPROL-XL) 24 hr tablet 50 mg (50 mg Oral Given 12/30/21 1735)  promethazine (PHENERGAN) 12.5 mg in sodium chloride 0.9 % 50 mL IVPB (0 mg Intravenous Stopped 12/30/21 1736)  lactated ringers bolus 1,000 mL (0 mLs Intravenous Stopped 12/30/21 1704)  ondansetron (ZOFRAN) injection 4 mg (4 mg Intravenous Given 12/30/21 1538)  labetalol (NORMODYNE) injection 10 mg (10 mg Intravenous Given 12/30/21 1537)  lisinopril (ZESTRIL) tablet 20 mg (20 mg Oral Given 12/30/21 1735)  sodium chloride 0.9 % bolus 1,000 mL (1,000 mLs Intravenous New Bag/Given 12/30/21 1706)  promethazine  (PHENERGAN) tablet 12.5 mg (12.5 mg Oral Given 12/30/21 1851)     IMPRESSION / MDM / ASSESSMENT AND PLAN / ED COURSE  I reviewed the triage vital signs and the nursing notes.                               Ddx:  Viral GI illness, foodborne illness, less likely diverticulitis, colitis, IBS   MDM:  56 year old well-appearing female here with nausea, vomiting, diarrhea.  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  She is afebrile and very well-appearing.  Her lab work is reassuring with no significant leukocytosis.  CMP with mild hypokalemia likely from her vomiting but normal renal function and liver function enzymes.  Lipase is normal.  Urinalysis shows no signs of UTI or significant dehydration.  On exam, patient's abdomen is completely soft and nontender with no focal signs to suggest cholecystitis, appendicitis, diverticulitis, obstruction, or other acute intra-abdominal emergency.  Patient was given IV fluids as well as IV antiemetics with improvement in her symptoms.  She is tolerating p.o.  Patient is otherwise fairly healthy with no significant risk factors for decompensation and has a supportive spouse at home who can help her with her medications and food.  We will trial outpatient management.  Given absence of any focal tenderness, leukocytosis, do not feel CT imaging is needed at this time.  Patient is in agreement this plan.  Discharged home with good return precautions.  MEDICATIONS GIVEN IN ED: Medications  metoprolol succinate (TOPROL-XL) 24 hr tablet 50 mg (50 mg Oral Given 12/30/21 1735)  promethazine (PHENERGAN) 12.5 mg in sodium chloride 0.9 % 50 mL IVPB (0 mg Intravenous Stopped 12/30/21 1736)  lactated ringers bolus 1,000 mL (0 mLs Intravenous Stopped 12/30/21 1704)  ondansetron (ZOFRAN) injection 4 mg (4 mg Intravenous Given 12/30/21 1538)  labetalol (NORMODYNE) injection 10 mg (10 mg Intravenous Given 12/30/21 1537)  lisinopril (ZESTRIL) tablet 20 mg (20 mg Oral  Given 12/30/21 1735)  sodium chloride 0.9 % bolus 1,000 mL (1,000 mLs Intravenous New Bag/Given 12/30/21 1706)  promethazine (PHENERGAN) tablet 12.5 mg (12.5 mg Oral Given 12/30/21 1851)     Consults:  None   EMR reviewed  Prior hospitalization notes from last year which show triple-vessel CABG and follow-up with Dr. Rockey Situ on 04/2021     FINAL CLINICAL IMPRESSION(S) / ED DIAGNOSES   Final diagnoses:  Nausea vomiting and diarrhea  Dehydration     Rx /  DC Orders   ED Discharge Orders          Ordered    ondansetron (ZOFRAN-ODT) 4 MG disintegrating tablet  Every 8 hours PRN        12/30/21 1912    promethazine (PHENERGAN) 25 MG suppository  Every 8 hours PRN        12/30/21 1912             Note:  This document was prepared using Dragon voice recognition software and may include unintentional dictation errors.   Duffy Bruce, MD 12/30/21 [phone]

## 2021-12-30 NOTE — ED Triage Notes (Signed)
First Nurse Note:  C/O Nausea and diarrhea since Monday.  AAOx3.  Skin warm and dry.  NAD

## 2022-01-02 IMAGING — DX DG CHEST 1V PORT
1 series · 1 of 1 positions shown · non-contrast
Comparison: Chest radiograph from one day prior.

CLINICAL DATA: Post CABG

EXAM:
PORTABLE CHEST 1 VIEW

[chest ap]
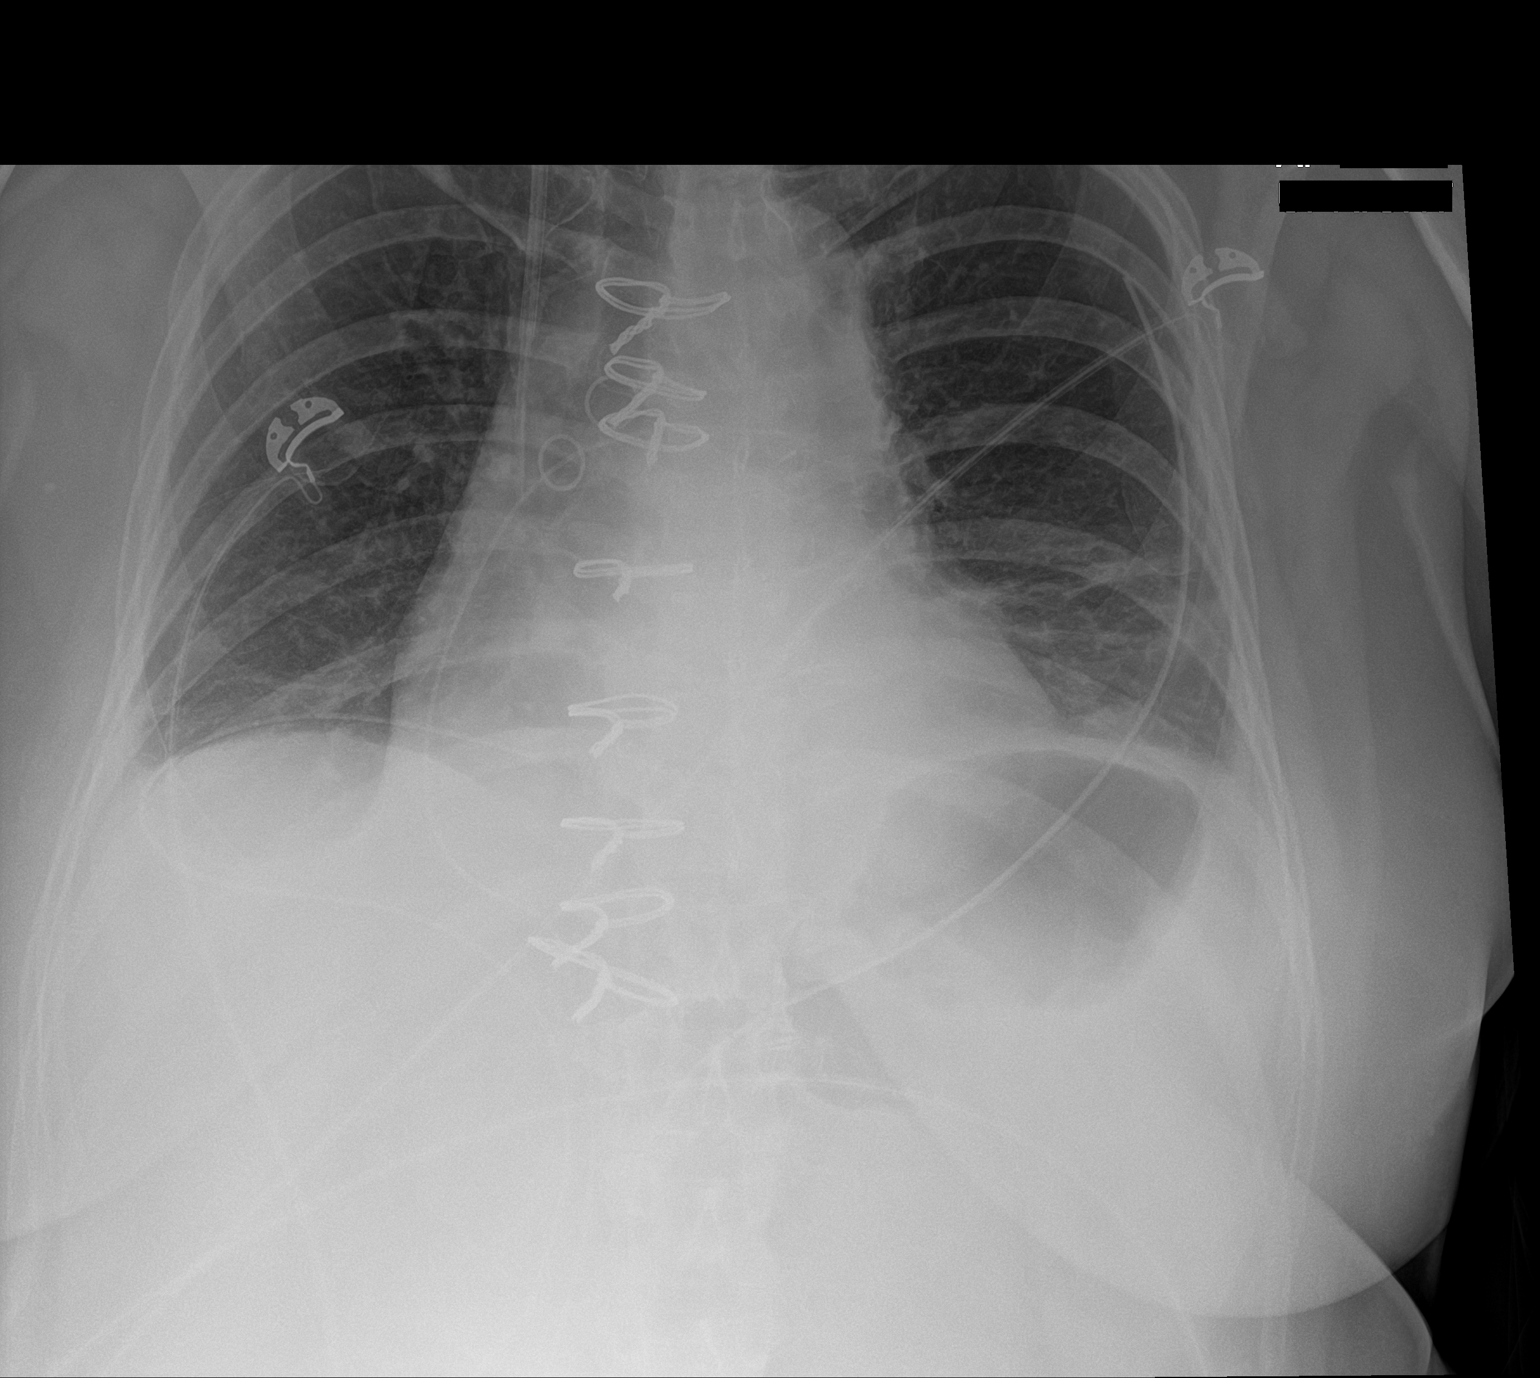

[1 of 1 positions shown; findings below may reference images not displayed]

FINDINGS: Right internal jugular central venous catheter terminates in the
lower third of the SVC. Intact sternotomy wires. Interval extubation
and removal of enteric tube. Stable upper left and lower right chest
tubes and mediastinal drain. Stable cardiomediastinal silhouette
with mild cardiomegaly. No pneumothorax. Slight blunting of both
costophrenic angles, slightly increased. No pulmonary edema. Similar
mild bibasilar atelectasis.
IMPRESSION: 1. No pneumothorax.
2. Slight blunting of both costophrenic angles, slightly increased,
suggesting small bilateral pleural effusions. Similar mild bibasilar
atelectasis.
3. Stable mild cardiomegaly without pulmonary edema.

## 2022-02-02 ENCOUNTER — Encounter: Payer: Self-pay | Admitting: Emergency Medicine

## 2022-02-02 ENCOUNTER — Emergency Department
Admission: EM | Admit: 2022-02-02 | Discharge: 2022-02-02 | Disposition: A | Discharge status: Home / Self Care | Payer: BC Managed Care – PPO | Attending: Emergency Medicine | Admitting: Emergency Medicine

## 2022-02-02 ENCOUNTER — Other Ambulatory Visit: Payer: Self-pay

## 2022-02-02 DIAGNOSIS — I2510 Atherosclerotic heart disease of native coronary artery without angina pectoris: Secondary | ICD-10-CM | POA: Diagnosis not present

## 2022-02-02 DIAGNOSIS — R000 Tachycardia, unspecified: Secondary | ICD-10-CM | POA: Diagnosis not present

## 2022-02-02 DIAGNOSIS — I10 Essential (primary) hypertension: Secondary | ICD-10-CM | POA: Insufficient documentation

## 2022-02-02 DIAGNOSIS — R112 Nausea with vomiting, unspecified: Secondary | ICD-10-CM | POA: Diagnosis not present

## 2022-02-02 DIAGNOSIS — R197 Diarrhea, unspecified: Secondary | ICD-10-CM | POA: Diagnosis not present

## 2022-02-02 DIAGNOSIS — R42 Dizziness and giddiness: Secondary | ICD-10-CM | POA: Diagnosis not present

## 2022-02-02 DIAGNOSIS — K2900 Acute gastritis without bleeding: Secondary | ICD-10-CM | POA: Insufficient documentation

## 2022-02-02 LAB — CBC
HCT: 48.7 % — ABNORMAL HIGH (ref 36.0–46.0)
Hemoglobin: 16.2 g/dL — ABNORMAL HIGH (ref 12.0–15.0)
MCH: 31.2 pg (ref 26.0–34.0)
MCHC: 33.3 g/dL (ref 30.0–36.0)
MCV: 93.7 fL (ref 80.0–100.0)
Platelets: 200 10*3/uL (ref 150–400)
RBC: 5.2 MIL/uL — ABNORMAL HIGH (ref 3.87–5.11)
RDW: 13.2 % (ref 11.5–15.5)
WBC: 7.5 10*3/uL (ref 4.0–10.5)
nRBC: 0 % (ref 0.0–0.2)

## 2022-02-02 LAB — BASIC METABOLIC PANEL
Anion gap: 11 (ref 5–15)
BUN: 21 mg/dL — ABNORMAL HIGH (ref 6–20)
CO2: 24 mmol/L (ref 22–32)
Calcium: 9.8 mg/dL (ref 8.9–10.3)
Chloride: 99 mmol/L (ref 98–111)
Creatinine, Ser: 1.12 mg/dL — ABNORMAL HIGH (ref 0.44–1.00)
GFR, Estimated: 59 mL/min — ABNORMAL LOW (ref >60)
Glucose, Bld: 160 mg/dL — ABNORMAL HIGH (ref 70–99)
Potassium: 3.8 mmol/L (ref 3.5–5.1)
Sodium: 134 mmol/L — ABNORMAL LOW (ref 135–145)

## 2022-02-02 MED ORDER — PROMETHAZINE HCL 25 MG RE SUPP: 25.0000 mg | Freq: Three times a day (TID) | RECTAL | 0 refills | Status: AC | PRN

## 2022-02-02 MED ORDER — ONDANSETRON 4 MG PO TBDP: 4.0000 mg | ORAL_TABLET | Freq: Three times a day (TID) | ORAL | 0 refills | Status: DC | PRN

## 2022-02-02 NOTE — ED Provider Notes (Signed)
? ?[facility] ?Provider Note ? ? ? Event Date/Time  ? First MD Initiated Contact with Patient 02/02/22 [phone]   ?  (approximate) ? ? ?History  ? ?Dizziness ? ? ?HPI ? ?Victoria Avery is a 56 y.o. female with a history of hypertension, CAD who presents with complaints of nausea, vomiting over the last 1 day.  Patient reports diarrhea as well.  Occasional abdominal cramping, no fevers or chills.  No sick contacts.  Has not take anything for this.  No chest pain, no shortness of breath. ?  ? ? ?Physical Exam  ? ?Triage Vital Signs: ?ED Triage Vitals  ?Enc Vitals Group  ?   BP 02/02/22 0859 (!) 133/97  ?   Pulse Rate 02/02/22 0859 (!) 102  ?   Resp --   ?   Temp 02/02/22 0859 98.6 ?F (37 ?C)  ?   Temp Source 02/02/22 0859 Oral  ?   SpO2 02/02/22 0859 95 %  ?   Weight 02/02/22 0854 85 kg (187 lb 6.3 oz)  ?   Height 02/02/22 0854 1.575 m (5\' 2" )  ?   Head Circumference --   ?   Peak Flow --   ?   Pain Score 02/02/22 0854 0  ?   Pain Loc --   ?   Pain Edu? --   ?   Excl. in GC? --   ? ? ?Most recent vital signs: ?Vitals:  ? 02/02/22 0859  ?BP: (!) 133/97  ?Pulse: (!) 102  ?Temp: 98.6 ?F (37 ?C)  ?SpO2: 95%  ? ? ? ?General: Awake, no distress.  ?CV:  Good peripheral perfusion.  ?Resp:  Normal effort.  ?Abd:  No distention.  Reassuring abdominal exam ?Other:   ? ? ?ED Results / Procedures / Treatments  ? ?Labs ?(all labs ordered are listed, but only abnormal results are displayed) ?Labs Reviewed  ?BASIC METABOLIC PANEL - Abnormal; Notable for the following components:  ?    Result Value  ? Sodium 134 (*)   ? Glucose, Bld 160 (*)   ? BUN 21 (*)   ? Creatinine, Ser 1.12 (*)   ? GFR, Estimated 59 (*)   ? All other components within normal limits  ?CBC - Abnormal; Notable for the following components:  ? RBC 5.20 (*)   ? Hemoglobin 16.2 (*)   ? HCT 48.7 (*)   ? All other components within normal limits  ?CBG MONITORING, ED  ? ? ? ?EKG ? ?ED ECG REPORT ?I, 04/04/22, the attending physician,  personally viewed and interpreted this ECG. ? ?Date: 02/02/2022 ? ?Rhythm: Sinus tachycardia ?QRS Axis: normal ?Intervals: normal ?ST/T Wave abnormalities: Nonspecific changes ?Narrative Interpretation: No significant change from prior ? ? ? ?RADIOLOGY ?None ? ? ? ?PROCEDURES: ? ?Critical Care performed:  ? ?Procedures ? ? ?MEDICATIONS ORDERED IN ED: ?Medications - No data to display ? ? ?IMPRESSION / MDM / ASSESSMENT AND PLAN / ED COURSE  ?I reviewed the triage vital signs and the nursing notes. ? ? ?Patient presents with nausea, vomiting, abdominal cramping and diarrhea.  Most consistent with viral gastroenteritis.  No chest pain, no diaphoresis.  She is frustrated because she recently had a similar viral illness that she recovered from a couple of weeks ago. ? ?Lab work reviewed today and is reassuring, mildly elevated BUN/creatinine ratio consistent with mild dehydration, will prescribe antiemetics, supportive care recommended, return precautions discussed. ? ? ? ? ?  ? ? ?FINAL  CLINICAL IMPRESSION(S) / ED DIAGNOSES  ? ?Final diagnoses:  ?Acute gastritis without hemorrhage, unspecified gastritis type  ? ? ? ?Rx / DC Orders  ? ?ED Discharge Orders   ? ?      Ordered  ?  ondansetron (ZOFRAN-ODT) 4 MG disintegrating tablet  Every 8 hours PRN       ? 02/02/22 0948  ?  promethazine (PHENERGAN) 25 MG suppository  Every 8 hours PRN       ? 02/02/22 0948  ? ?  ?  ? ?  ? ? ? ?Note:  This document was prepared using Dragon voice recognition software and may include unintentional dictation errors. ?  ?Jene Every, MD ?02/02/22 1031 ? ?

## 2022-02-02 NOTE — ED Triage Notes (Signed)
C/O nausea, lightheadedness and weakness x 2-3 days. States symptoms have been intermittently ongoing x 1 month. ? ?AAOx3.  Skin warm and dry.  Patient appears anxious.  NAD ?

## 2022-02-02 NOTE — ED Notes (Signed)
See triage note  presents with couple weeks of intermittent dizziness and lightheaded  ?

## 2022-02-05 IMAGING — CR DG CHEST 2V
2 series · 2 of 2 positions shown · non-contrast
Comparison: 01/21/2020

CLINICAL DATA: History of coronary bypass grafting

EXAM:
CHEST - 2 VIEW

[w chest pa]
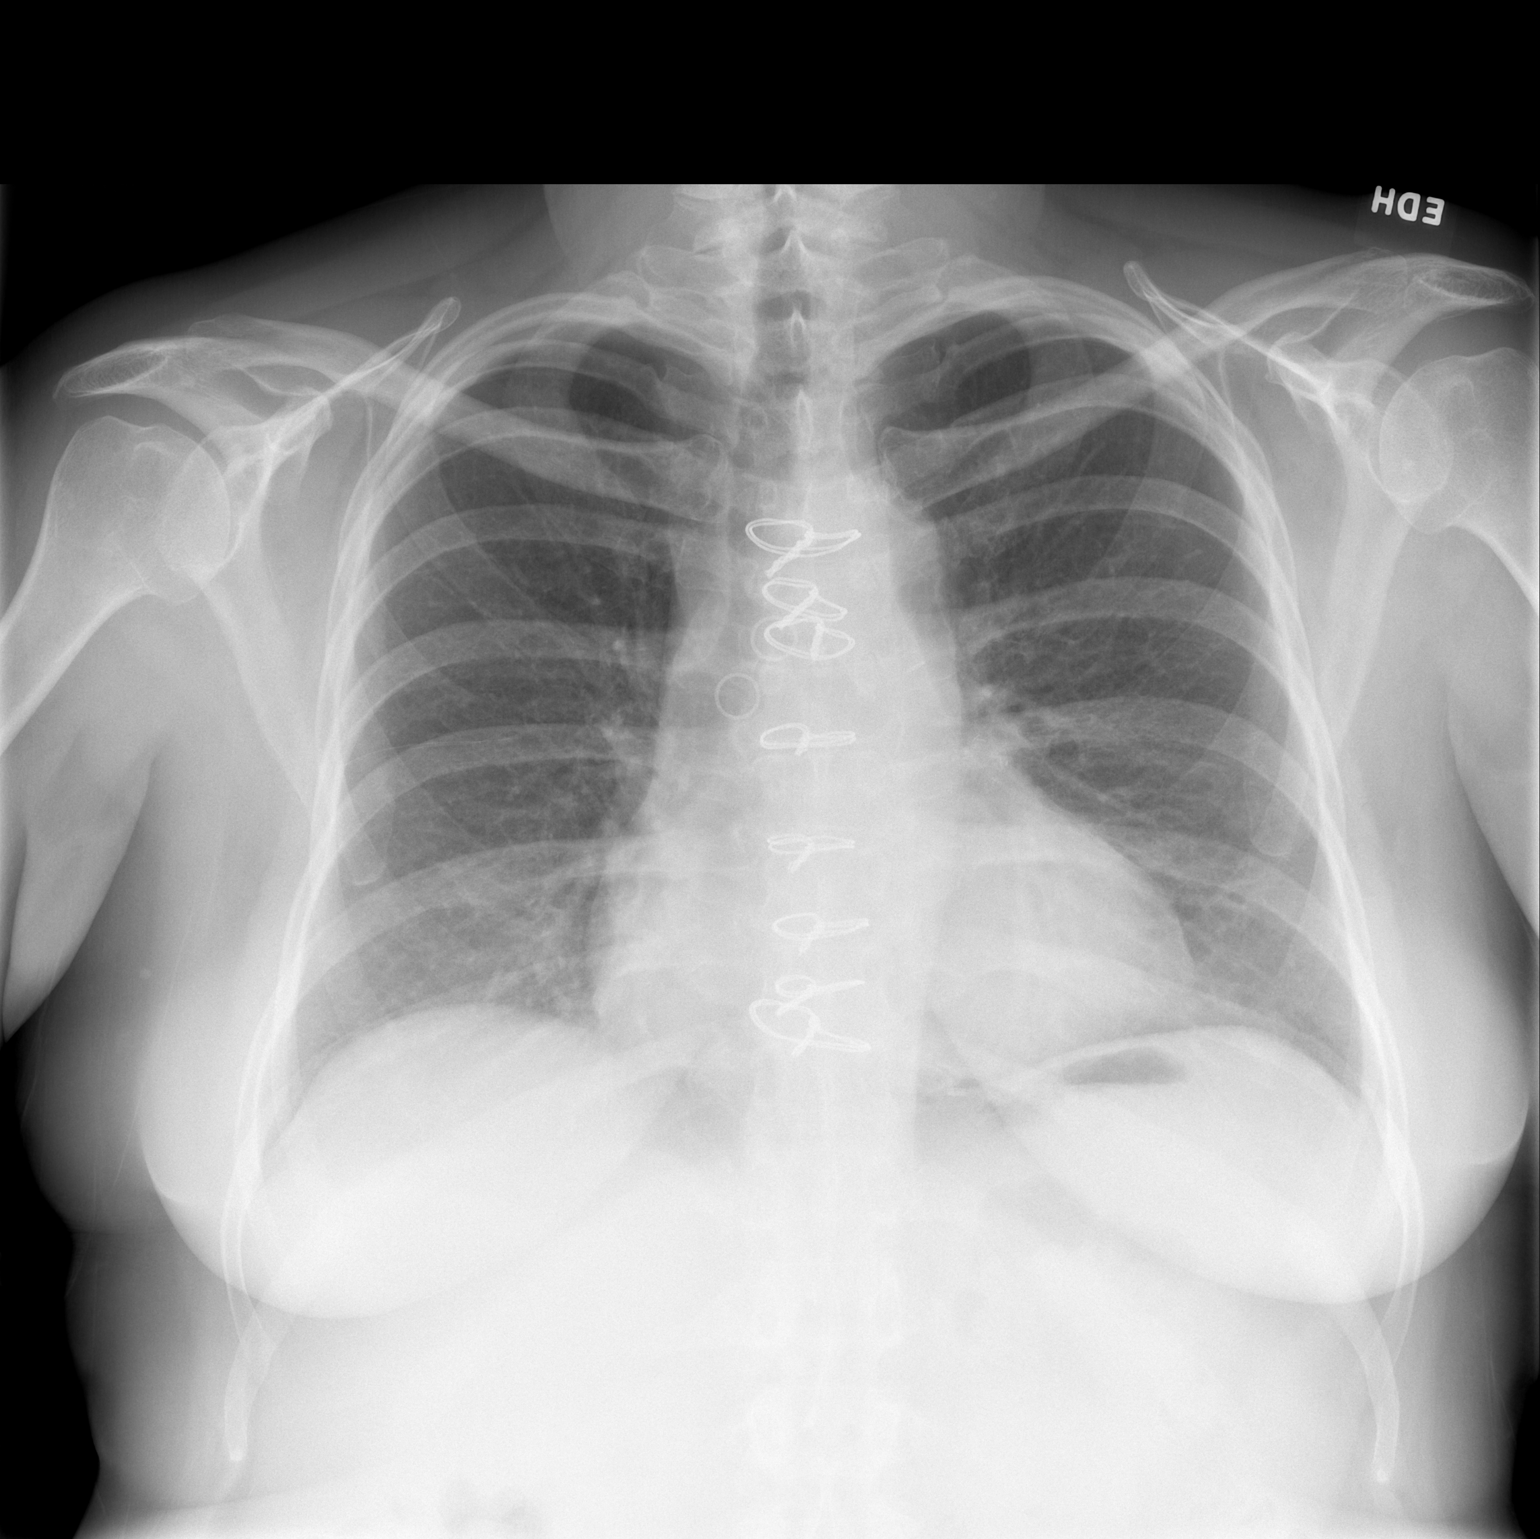

[w chest lat]
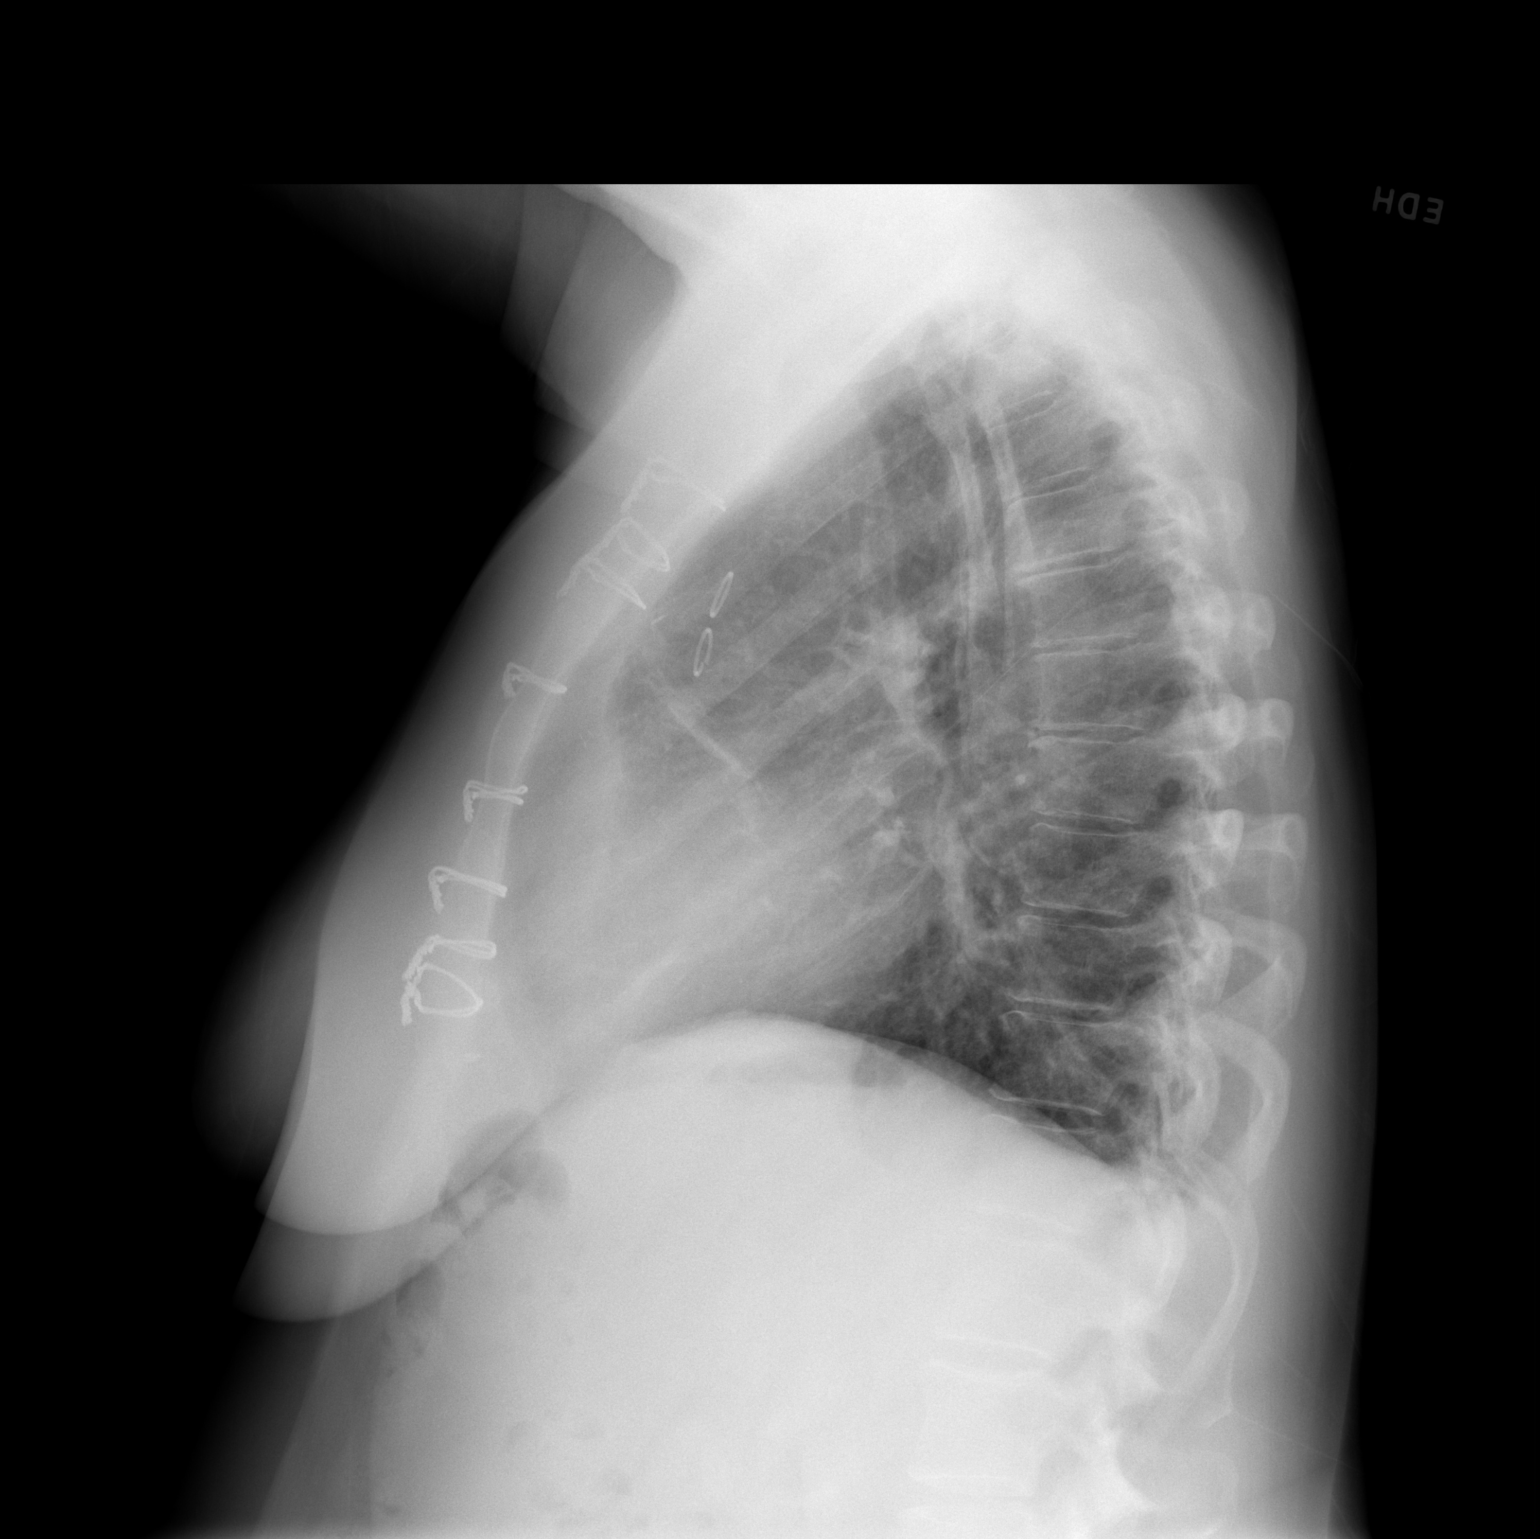

[2 of 2 positions shown; findings below may reference images not displayed]

FINDINGS: Cardiac shadow is stable. Postsurgical changes are again seen.
Resolution of previously seen left basilar atelectasis is seen. No
new focal infiltrate is seen. No sizable effusion is noted. No acute
bony abnormality is seen.
IMPRESSION: No active cardiopulmonary disease.

## 2022-03-19 ENCOUNTER — Other Ambulatory Visit: Payer: Self-pay | Admitting: Cardiovascular Disease

## 2022-04-21 ENCOUNTER — Other Ambulatory Visit: Payer: Self-pay | Admitting: Cardiovascular Disease

## 2022-04-21 DIAGNOSIS — E785 Hyperlipidemia, unspecified: Secondary | ICD-10-CM

## 2022-06-14 ENCOUNTER — Other Ambulatory Visit: Payer: Self-pay | Admitting: Cardiovascular Disease

## 2022-06-15 NOTE — Telephone Encounter (Signed)
Please contact pt for future appointment. Pt due for 12 month f/u. 

## 2022-06-15 NOTE — Telephone Encounter (Signed)
Attempted to schedule.  LMOV to call office.  ° °

## 2022-06-29 NOTE — Telephone Encounter (Signed)
Attempted to schedule.  LMOV to call office.  ° °

## 2022-07-05 NOTE — Telephone Encounter (Signed)
Scheduled

## 2022-07-13 ENCOUNTER — Other Ambulatory Visit: Payer: Self-pay | Admitting: Cardiovascular Disease

## 2022-07-13 NOTE — Telephone Encounter (Signed)
Please advise if OK to refill. Patient last seen 04/2021. Scheduled appt with Dr. Mariah Milling on 09/27/22. Thank you!

## 2022-07-21 ENCOUNTER — Other Ambulatory Visit: Payer: Self-pay | Admitting: Cardiovascular Disease

## 2022-07-21 DIAGNOSIS — I25118 Atherosclerotic heart disease of native coronary artery with other forms of angina pectoris: Secondary | ICD-10-CM

## 2022-07-23 ENCOUNTER — Other Ambulatory Visit: Payer: Self-pay

## 2022-07-23 DIAGNOSIS — I25118 Atherosclerotic heart disease of native coronary artery with other forms of angina pectoris: Secondary | ICD-10-CM

## 2022-07-23 DIAGNOSIS — E785 Hyperlipidemia, unspecified: Secondary | ICD-10-CM

## 2022-07-23 MED ORDER — ATORVASTATIN CALCIUM 80 MG PO TABS: ORAL_TABLET | ORAL | 0 refills | Status: DC

## 2022-07-23 MED ORDER — METOPROLOL SUCCINATE ER 50 MG PO TB24: ORAL_TABLET | ORAL | 0 refills | Status: DC

## 2022-07-23 MED ORDER — LISINOPRIL-HYDROCHLOROTHIAZIDE 20-12.5 MG PO TABS: 1.0000 | ORAL_TABLET | Freq: Every day | ORAL | 0 refills | Status: DC

## 2022-08-14 ENCOUNTER — Other Ambulatory Visit: Payer: Self-pay | Admitting: Cardiovascular Disease

## 2022-09-25 NOTE — Progress Notes (Unsigned)
Cardiology Office Note  Date:  09/27/2022   ID:  Victoria Avery, DOB June 19, 1966, MRN [id number]  PCP:  Pcp, No   Chief Complaint  Patient presents with   12 month follow up     "Doing well." Medications reviewed by the patient verbally.     HPI:  Victoria Avery is a 56 y.o. female with a past medical history of malignant hypertension smoking, who continues to smoke 1 pack/day CAD, CABG x3,  Feb 2021 Who presents for f/u of her CAD, CABG  Last seen by myself in clinic June 2022  Doing well, denies significant chest pain concerning for angina  Previously worked for [employer], Now Works for [employer], recent trips to [place] with family  Continues to smoke 1/2 ppd Tried chantix, unable to quit, unable to tolerate vapor cigarettes  Blood pressure elevated today, not checking at home  Does not have primary care physician  Completed cardiac rehab July 2021  EKG personally reviewed by myself on todays visit Normal sinus rhythm rate 92 bpm nonspecific ST-T wave abnormality, no change from prior EKGs  Past medical history reviewed  [facility] 01/13/2020 reporting chest pain.   ED EKG showed ST dependent depression with positive troponin.  Ruled in for NSTEMI.    Cardiac cath 01/14/2020 with mildly reduced LVEF and multivessel CAD.   Transferred to [facility] for [facility]G.   Underwent CABG with LIMA to LAD, R SVG to diagonal 1, SVG to PDA.  discharged 01/21/2020.  Cardiac cath 01/14/20 There is mild left ventricular systolic dysfunction. LV end diastolic pressure is normal. The left ventricular ejection fraction is 45-50% by visual estimate. Prox RCA lesion is 90% stenosed. Prox RCA to Mid RCA lesion is 100% stenosed. Mid Cx to Dist Cx lesion is 99% stenosed. Mid LAD-1 lesion is 90% stenosed. Mid LAD-2 lesion is 60% stenosed. 1st Diag-1 lesion is 85% stenosed. 1st Diag-2 lesion is 70% stenosed.   1.  Severe three-vessel coronary artery disease with chronic occlusion of  the RCA with left-to-right collaterals, subtotal occlusion of the distal left circumflex supplying small OM branches and significant mid LAD stenosis and significant disease in ostial large first diagonal.  OM branches are likely too small to graft. 2.  Mildly reduced LV systolic function with an EF of 45 to 50% with inferior wall hypokinesis. 3.  Normal left ventricular end-diastolic pressure.  Echo 01/14/20  1. Left ventricular ejection fraction, by estimation, is 50 to 55%. The  left ventricle has low normal function. LV endocardial border not  optimally defined to evaluate regional wall motion. Left ventricular  diastolic parameters were normal.   2. Right ventricular systolic function is normal. The right ventricular  size is normal. Tricuspid regurgitation signal is inadequate for assessing  PA pressure.   Echo2/15/2021 Left ventricular ejection fraction, by estimation, is 50 to 55%. The  left ventricle has low normal function. LV endocardial border not  optimally defined to evaluate regional wall motion. Left ventricular  diastolic parameters were normal.    PMH:   has a past medical history of Cardiomyopathy ([place]), Hypertension, and Tobacco abuse.  PSH:    Past Surgical History:  Procedure Laterality Date   CARDIAC CATHETERIZATION     CESAREAN SECTION     CORONARY ARTERY BYPASS GRAFT N/A 01/18/2020   Procedure: OFF PUMP CORONARY ARTERY BYPASS GRAFTING (CABG) times three using left internal mammary artery and right greater saphenous vein harvested endoscopically.;  Surgeon: Lajuana Matte, MD;  [facility]  OR;  Service: Open Heart Surgery;  Laterality: N/A;   LEFT HEART CATH AND CORONARY ANGIOGRAPHY N/A 01/14/2020   Procedure: LEFT HEART CATH AND CORONARY ANGIOGRAPHY;  Surgeon: Wellington Hampshire, MD;  Location: [facility];  Service: Cardiovascular;  Laterality: N/A;   TEE WITHOUT CARDIOVERSION N/A 01/18/2020   Procedure: TRANSESOPHAGEAL ECHOCARDIOGRAM (TEE);   Surgeon: Lajuana Matte, MD;  Location: [facility];  Service: Open Heart Surgery;  Laterality: N/A;    Current Outpatient Medications  Medication Sig Dispense Refill   acetaminophen (TYLENOL) 500 MG tablet Take 2 tablets (1,000 mg total) by mouth every 6 (six) hours as needed. 30 tablet 0   aspirin EC 81 MG tablet Take 1 tablet (81 mg total) by mouth daily. Swallow whole. 90 tablet 3   atorvastatin (LIPITOR) 80 MG tablet TAKE 1 TABLET BY MOUTH DAILY AT 6 PM. 90 tablet 0   lisinopril-hydrochlorothiazide (ZESTORETIC) 20-12.5 MG tablet TAKE 1 TABLET BY MOUTH EVERY DAY 30 tablet 0   metoprolol succinate (TOPROL-XL) 50 MG 24 hr tablet TAKE 1 TABLET BY MOUTH EVERY DAY WITH OR IMMEDIATELY FOLLOWING A MEAL 90 tablet 0   nitroGLYCERIN (NITROSTAT) 0.4 MG SL tablet PLACE 1 TABLET (0.4 MG TOTAL) UNDER THE TONGUE EVERY 5 (FIVE) MINUTES AS NEEDED FOR CHEST PAIN. 25 tablet 3   ondansetron (ZOFRAN-ODT) 4 MG disintegrating tablet Take 1 tablet (4 mg total) by mouth every 8 (eight) hours as needed for nausea or vomiting. (Patient not taking: Reported on 09/27/2022) 20 tablet 0   promethazine (PHENERGAN) 25 MG suppository Place 1 suppository (25 mg total) rectally every 8 (eight) hours as needed for refractory nausea / vomiting. (Patient not taking: Reported on 09/27/2022) 12 each 0   No current facility-administered medications for this visit.    Allergies:   Patient has no known allergies.   Social History:  The patient  reports that she has been smoking cigarettes. She has a 15.00 pack-year smoking history. She has never used smokeless tobacco. She reports current alcohol use. She reports that she does not use drugs.   Family History:   family history includes CAD in her mother; Hypertension in her mother.    Review of Systems: Review of Systems  Constitutional: Negative.   HENT: Negative.    Respiratory: Negative.    Cardiovascular: Negative.   Gastrointestinal: Negative.   Musculoskeletal:  Negative.   Neurological: Negative.   Psychiatric/Behavioral:  The patient is nervous/anxious.   All other systems reviewed and are negative.   PHYSICAL EXAM: VS:  BP (!) 160/88 (BP Location: Left Arm, Patient Position: Sitting, Cuff Size: Normal)   Pulse 92   Ht 5\' 2"  (1.575 m)   Wt 167 lb (75.8 kg)   LMP 11/25/2017   SpO2 96%   BMI 30.54 kg/m  , BMI Body mass index is 30.54 kg/m. Constitutional:  oriented to person, place, and time. No distress.  HENT:  Head: Grossly normal Eyes:  no discharge. No scleral icterus.  Neck: No JVD, no carotid bruits  Cardiovascular: Regular rate and rhythm, no murmurs appreciated Pulmonary/Chest: Clear to auscultation bilaterally, no wheezes or rails Abdominal: Soft.  no distension.  no tenderness.  Musculoskeletal: Normal range of motion Neurological:  normal muscle tone. Coordination normal. No atrophy Skin: Skin warm and dry Psychiatric: normal affect, pleasant  Recent Labs: 12/30/2021: ALT 19 02/02/2022: BUN 21; Creatinine, Ser 1.12; Hemoglobin 16.2; Platelets 200; Potassium 3.8; Sodium 134    Lipid Panel Lab Results  Component Value Date  CHOL 181 05/19/2021   HDL 35 (L) 05/19/2021   LDLCALC 99 05/19/2021   TRIG 279 (H) 05/19/2021      Wt Readings from Last 3 Encounters:  09/27/22 167 lb (75.8 kg)  02/02/22 187 lb 6.3 oz (85 kg)  05/19/21 187 lb 8 oz (85 kg)     ASSESSMENT AND PLAN: CAD with stable angina S/p CABG Completed cardiac rehab Smoking cessation recommended Repeat lipid panel ordered, on Lipitor 80  Smoker Could not tolerate Chantix this caused nausea Vapor cigarettes caused cough Recommend she continue to wean off the cigarettes  Hyperlipidemia Lipid panel ordered, continue Lipitor 80, goal LDL less than 60  Essential hypertension Blood pressure elevated, mild improvement on recheck Recommend she double the lisinopril HCTZ Repeat BMP Minimally elevated renal function early 2023 in the setting of  diarrhea  Shortness of breath Walking program recommended   Total encounter time more than 30 minutes  Greater than 50% was spent in counseling and coordination of care with the patient    No orders of the defined types were placed in this encounter.    Signed, Esmond Plants, M.D., Ph.D. 09/27/2022  [place], [place]

## 2022-09-27 ENCOUNTER — Encounter: Payer: Self-pay | Admitting: Cardiovascular Disease

## 2022-09-27 ENCOUNTER — Ambulatory Visit: Payer: BC Managed Care – PPO | Attending: Cardiovascular Disease | Admitting: Cardiovascular Disease

## 2022-09-27 VITALS — BP 145/80 | HR 92 | Ht 62.0 in | Wt 167.0 lb

## 2022-09-27 DIAGNOSIS — E785 Hyperlipidemia, unspecified: Secondary | ICD-10-CM

## 2022-09-27 DIAGNOSIS — I429 Cardiomyopathy, unspecified: Secondary | ICD-10-CM | POA: Diagnosis not present

## 2022-09-27 DIAGNOSIS — I10 Essential (primary) hypertension: Secondary | ICD-10-CM

## 2022-09-27 DIAGNOSIS — Z951 Presence of aortocoronary bypass graft: Secondary | ICD-10-CM | POA: Diagnosis not present

## 2022-09-27 DIAGNOSIS — F17200 Nicotine dependence, unspecified, uncomplicated: Secondary | ICD-10-CM | POA: Diagnosis not present

## 2022-09-27 DIAGNOSIS — I25118 Atherosclerotic heart disease of native coronary artery with other forms of angina pectoris: Secondary | ICD-10-CM

## 2022-09-27 MED ORDER — NITROGLYCERIN 0.4 MG SL SUBL: 0.4000 mg | SUBLINGUAL_TABLET | SUBLINGUAL | 3 refills | Status: DC | PRN

## 2022-09-27 MED ORDER — METOPROLOL SUCCINATE ER 50 MG PO TB24: ORAL_TABLET | ORAL | 3 refills | Status: DC

## 2022-09-27 MED ORDER — LISINOPRIL-HYDROCHLOROTHIAZIDE 20-12.5 MG PO TABS: ORAL_TABLET | ORAL | 3 refills | Status: DC

## 2022-09-27 NOTE — Patient Instructions (Addendum)
Medication Instructions:  - Your physician has recommended you make the following change in your medication:   1) INCREASE lisinopril HCTZ 20-12.5 mg - take 2 tablets (40-25 mg) by mouth once daily  Metoprolol has been refilled today  Monitor blood pressure at home, Goal <130  If you need a refill on your cardiac medications before your next appointment, please call your pharmacy.    Lab work: - Your physician recommends that you return for FASTING lab work at your convenience:     Lipids , Liver,  BMP  Do  not eat/ drink anything for 8 hours prior to your lab draw except for water/ black coffee  [facility] Entrance at [facility] 1st desk on the right to check in (REGISTRATION)  Lab hours: Monday- Friday (7:30 am- 5:30 pm)    Testing/Procedures: No new testing needed   Follow-Up: At [facility], you and your health needs are our priority.  As part of our continuing mission to provide you with exceptional heart care, we have created designated Provider Care Teams.  These Care Teams include your primary Cardiologist (physician) and Advanced Practice Providers (APPs -  Physician Assistants and Nurse Practitioners) who all work together to provide you with the care you need, when you need it.  You will need a follow up appointment in 12 months  Providers on your designated Care Team:   Murray Hodgkins, NP Christell Faith, PA-C Cadence Kathlen Mody, [place]  COVID-19 Vaccine Information can be found at: [email or web address] For questions related to vaccine distribution or appointments, please email [email or web address] or call [phone].

## 2022-12-13 ENCOUNTER — Other Ambulatory Visit: Payer: Self-pay | Admitting: Cardiovascular Disease

## 2022-12-13 DIAGNOSIS — E785 Hyperlipidemia, unspecified: Secondary | ICD-10-CM

## 2023-08-29 ENCOUNTER — Other Ambulatory Visit: Payer: Self-pay | Admitting: Cardiovascular Disease

## 2023-08-29 NOTE — Telephone Encounter (Signed)
last visit on 09/27/22 with plan to f/u in 12 months, please schedule.  Thanks

## 2023-08-30 ENCOUNTER — Telehealth: Payer: Self-pay | Admitting: Cardiovascular Disease

## 2023-08-30 NOTE — Telephone Encounter (Signed)
Left voice mail to schedule appt

## 2023-08-30 NOTE — Telephone Encounter (Signed)
 Left voice mail, pt needs appt scheduled from recall.

## 2023-09-05 NOTE — Telephone Encounter (Signed)
Left voice mail

## 2023-09-05 NOTE — Telephone Encounter (Signed)
 Left voice mail, pt needs appt scheduled from recall.

## 2023-09-06 NOTE — Telephone Encounter (Signed)
Pt scheduled on 11/19

## 2023-09-15 ENCOUNTER — Other Ambulatory Visit: Payer: Self-pay | Admitting: Cardiovascular Disease

## 2023-09-15 NOTE — Telephone Encounter (Signed)
Good Morning,   Could you schedule this patient a 12 month follow up visit? The patient was last seen by Dr. Mariah Milling on 09-27-2022. Thank you so much.

## 2023-10-17 NOTE — Progress Notes (Unsigned)
Cardiology Office Note  Date:  10/18/2023   ID:  Victoria Avery, DOB 1965-12-06, MRN [id number]  PCP:  Pcp, No   Chief Complaint  Patient presents with   12 month follow up     "Doing well." Medications reviewed by the patient verbally.     HPI:  Victoria Avery is a 57 y.o. 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 Chronic cough Denies significant chest pain concerning for angina Tolerating current medications, and has not been checking blood pressure at home Denies significant lower extremity edema, no PND orthopnea  Continues to smoke one half up to 1 pack/day Tried chantix, unable to quit, unable to tolerate vapor cigarette  Does not have primary care physician, thinking about getting 1  Completed cardiac rehab July 2021  EKG personally reviewed by myself on todays visit EKG Interpretation Date/Time:  Tuesday October 18 2023 09:58:49 EST Ventricular Rate:  81 PR Interval:  132 QRS Duration:  90 QT Interval:  378 QTC Calculation: 439 R Axis:   72  Text Interpretation: Normal sinus rhythm Possible Left atrial enlargement Minimal voltage criteria for LVH, may be normal variant ( Cornell product ) When compared with ECG of 02-Feb-2022 09:01, ST elevation now present in Inferior leads T wave inversion no longer evident in Inferior leads T wave inversion no longer evident in Anterolateral leads Confirmed by Julien Nordmann [phone]) on 10/18/2023 10:06:13 AM    Past medical history reviewed  [facility] 01/13/2020 reporting chest pain.   ED EKG showed ST dependent depression with positive troponin.  Ruled in for NSTEMI.    Cardiac cath 01/14/2020 with mildly reduced LVEF and multivessel CAD.   Transferred to [facility] for CABG.   Underwent CABG with LIMA to LAD, R  SVG to diagonal 1, SVG to PDA.  discharged 01/21/2020.  Cardiac cath 01/14/20 There is mild left ventricular systolic dysfunction. LV end diastolic pressure is normal. The left ventricular ejection fraction is 45-50% by visual estimate. Prox RCA lesion is 90% stenosed. Prox RCA to Mid RCA lesion is 100% stenosed. Mid Cx to Dist Cx lesion is 99% stenosed. Mid LAD-1 lesion is 90% stenosed. Mid LAD-2 lesion is 60% stenosed. 1st Diag-1 lesion is 85% stenosed. 1st Diag-2 lesion is 70% stenosed.   1.  Severe three-vessel coronary artery disease with chronic occlusion of the RCA with left-to-right collaterals, subtotal occlusion of the distal left circumflex supplying small OM branches and significant mid LAD stenosis and significant disease in ostial large first diagonal.  OM branches are likely too small to graft. 2.  Mildly reduced LV systolic function with an EF of 45 to 50% with inferior wall hypokinesis. 3.  Normal left ventricular end-diastolic pressure.  Echo 01/14/20  1. Left ventricular ejection fraction, by estimation, is 50 to 55%. The  left ventricle has low normal function. LV endocardial border not  optimally defined to evaluate regional wall motion. Left ventricular  diastolic parameters were normal.   2. Right ventricular systolic function is normal. The right ventricular  size is normal. Tricuspid regurgitation signal is inadequate for assessing  PA pressure.   Echo2/15/2021 Left ventricular ejection fraction, by estimation, is 50 to 55%. The  left ventricle has low normal function. LV endocardial border not  optimally defined to evaluate regional  wall motion. Left ventricular  diastolic parameters were normal.    PMH:   has a past medical history of Cardiomyopathy (HCC), Hypertension, and Tobacco abuse.  PSH:    Past Surgical History:  Procedure Laterality Date   CARDIAC CATHETERIZATION     CESAREAN SECTION     CORONARY ARTERY BYPASS GRAFT N/A 01/18/2020   Procedure:  OFF PUMP CORONARY ARTERY BYPASS GRAFTING (CABG) times three using left internal mammary artery and right greater saphenous vein harvested endoscopically.;  Surgeon: Corliss Skains, MD;  Location: [facility] OR;  Service: Open Heart Surgery;  Laterality: N/A;   LEFT HEART CATH AND CORONARY ANGIOGRAPHY N/A 01/14/2020   Procedure: LEFT HEART CATH AND CORONARY ANGIOGRAPHY;  Surgeon: Iran Ouch, MD;  Location: [facility] INVASIVE CV LAB;  Service: Cardiovascular;  Laterality: N/A;   TEE WITHOUT CARDIOVERSION N/A 01/18/2020   Procedure: TRANSESOPHAGEAL ECHOCARDIOGRAM (TEE);  Surgeon: Corliss Skains, MD;  Location: [facility] OR;  Service: Open Heart Surgery;  Laterality: N/A;    Current Outpatient Medications  Medication Sig Dispense Refill   acetaminophen (TYLENOL) 500 MG tablet Take 2 tablets (1,000 mg total) by mouth every 6 (six) hours as needed. 30 tablet 0   aspirin EC 81 MG tablet Take 1 tablet (81 mg total) by mouth daily. Swallow whole. 90 tablet 3   atorvastatin (LIPITOR) 80 MG tablet TAKE 1 TABLET DAILY AT 6 P.M. 90 tablet 3   metoprolol succinate (TOPROL-XL) 50 MG 24 hr tablet TAKE 1 TABLET DAILY WITH OR IMMEDIATELY FOLLOWING A MEAL 90 tablet 0   nitroGLYCERIN (NITROSTAT) 0.4 MG SL tablet Place 1 tablet (0.4 mg total) under the tongue every 5 (five) minutes as needed for chest pain. 25 tablet 3   lisinopril-hydrochlorothiazide (ZESTORETIC) 20-12.5 MG tablet TAKE 2 TABLETS ONCE DAILY/Due for follow up. 180 tablet 3   ondansetron (ZOFRAN-ODT) 4 MG disintegrating tablet Take 1 tablet (4 mg total) by mouth every 8 (eight) hours as needed for nausea or vomiting. (Patient not taking: Reported on 09/27/2022) 20 tablet 0   promethazine (PHENERGAN) 25 MG suppository Place 1 suppository (25 mg total) rectally every 8 (eight) hours as needed for refractory nausea / vomiting. (Patient not taking: Reported on 09/27/2022) 12 each 0   No current facility-administered medications for this visit.    Allergies:    Patient has no known allergies.   Social History:  The patient  reports that she has been smoking cigarettes. She has a 15 pack-year smoking history. She has never used smokeless tobacco. She reports current alcohol use. She reports that she does not use drugs.   Family History:   family history includes CAD in her mother; Hypertension in her mother.    Review of Systems: Review of Systems  Constitutional: Negative.   HENT: Negative.    Respiratory: Negative.    Cardiovascular: Negative.   Gastrointestinal: Negative.   Musculoskeletal: Negative.   Neurological: Negative.   Psychiatric/Behavioral: Negative.    All other systems reviewed and are negative.   PHYSICAL EXAM: VS:  BP (!) 140/78 (BP Location: Left Arm, Patient Position: Sitting, Cuff Size: Normal)   Pulse 81   Ht 5\' 2"  (1.575 m)   Wt 169 lb 2 oz (76.7 kg)   LMP 11/25/2017   SpO2 97%   BMI 30.93 kg/m  , BMI Body mass index is 30.93 kg/m. Constitutional:  oriented to person, place, and time. No distress.  HENT:  Head: Grossly normal Eyes:  no discharge. No scleral icterus.  Neck: No  JVD, no carotid bruits  Cardiovascular: Regular rate and rhythm, no murmurs appreciated Pulmonary/Chest: Clear to auscultation bilaterally, no wheezes or rails Abdominal: Soft.  no distension.  no tenderness.  Musculoskeletal: Normal range of motion Neurological:  normal muscle tone. Coordination normal. No atrophy Skin: Skin warm and dry Psychiatric: normal affect, pleasant   Recent Labs: No results found for requested labs within last 365 days.    Lipid Panel Lab Results  Component Value Date   CHOL 181 05/19/2021   HDL 35 (L) 05/19/2021   LDLCALC 99 05/19/2021   TRIG 279 (H) 05/19/2021      Wt Readings from Last 3 Encounters:  10/18/23 169 lb 2 oz (76.7 kg)  09/27/22 167 lb (75.8 kg)  02/02/22 187 lb 6.3 oz (85 kg)     ASSESSMENT AND PLAN: CAD with stable angina S/p CABG Completed cardiac rehab smoking  cessation recommended Repeat lipid panel ordered, on Lipitor 80 Goal LDL less than 70, preferably less than 55  Smoker Could not tolerate Chantix this caused nausea Vapor cigarettes caused cough Recommend weaning off cigarettes  Hyperlipidemia Lipid panel ordered,  Continue Lipitor 80, may need Zetia if numbers are elevated  Essential hypertension Blood pressure high end of range on today's visit, recommend she monitor closely at home BMP today as she is on lisinopril HCTZ 40/25 daily with metoprolol succinate 50 daily If blood pressure runs high could increase metoprolol succinate up to 75 daily or add amlodipine 5  Shortness of breath Walking program recommended Smoking cessation    Orders Placed This Encounter  Procedures   EKG 12-Lead     Signed, Dossie Arbour, M.D., Ph.D. 10/18/2023  [facility] [place], [place] [phone]

## 2023-10-18 ENCOUNTER — Ambulatory Visit: Payer: BC Managed Care – PPO | Attending: Cardiovascular Disease | Admitting: Cardiovascular Disease

## 2023-10-18 ENCOUNTER — Encounter: Payer: Self-pay | Admitting: Cardiovascular Disease

## 2023-10-18 VITALS — BP 140/78 | HR 81 | Ht 62.0 in | Wt 169.1 lb

## 2023-10-18 DIAGNOSIS — Z951 Presence of aortocoronary bypass graft: Secondary | ICD-10-CM | POA: Diagnosis not present

## 2023-10-18 DIAGNOSIS — I25118 Atherosclerotic heart disease of native coronary artery with other forms of angina pectoris: Secondary | ICD-10-CM | POA: Diagnosis not present

## 2023-10-18 DIAGNOSIS — I429 Cardiomyopathy, unspecified: Secondary | ICD-10-CM | POA: Diagnosis not present

## 2023-10-18 DIAGNOSIS — F17200 Nicotine dependence, unspecified, uncomplicated: Secondary | ICD-10-CM | POA: Diagnosis not present

## 2023-10-18 DIAGNOSIS — E785 Hyperlipidemia, unspecified: Secondary | ICD-10-CM

## 2023-10-18 DIAGNOSIS — I10 Essential (primary) hypertension: Secondary | ICD-10-CM

## 2023-10-18 DIAGNOSIS — Z79899 Other long term (current) drug therapy: Secondary | ICD-10-CM

## 2023-10-18 MED ORDER — LISINOPRIL-HYDROCHLOROTHIAZIDE 20-12.5 MG PO TABS: ORAL_TABLET | ORAL | 3 refills | Status: DC

## 2023-10-18 NOTE — Patient Instructions (Signed)
Medication Instructions:  No changes  If you need a refill on your cardiac medications before your next appointment, please call your pharmacy.   Lab work: CMP and lipids  Testing/Procedures: No new testing needed  Follow-Up: At [facility], you and your health needs are our priority.  As part of our continuing mission to provide you with exceptional heart care, we have created designated Provider Care Teams.  These Care Teams include your primary Cardiologist (physician) and Advanced Practice Providers (APPs -  Physician Assistants and Nurse Practitioners) who all work together to provide you with the care you need, when you need it.  You will need a follow up appointment in 12 months  Providers on your designated Care Team:   Nicolasa Ducking, NP Eula Listen, PA-C Cadence Fransico Michael, [place]  COVID-19 Vaccine Information can be found at: [email or web address] For questions related to vaccine distribution or appointments, please email [email or web address] or call [phone].

## 2023-10-19 LAB — LIPID PANEL
Chol/HDL Ratio: 3.7 ratio (ref 0.0–4.4)
Cholesterol, Total: 131 mg/dL (ref 100–199)
HDL: 35 mg/dL — ABNORMAL LOW (ref >39)
LDL Chol Calc (NIH): 73 mg/dL (ref 0–99)
Triglycerides: 128 mg/dL (ref 0–149)
VLDL Cholesterol Cal: 23 mg/dL (ref 5–40)

## 2023-10-19 LAB — COMPREHENSIVE METABOLIC PANEL
ALT: 19 [IU]/L (ref 0–32)
AST: 20 [IU]/L (ref 0–40)
Albumin: 4.8 g/dL (ref 3.8–4.9)
Alkaline Phosphatase: 98 [IU]/L (ref 44–121)
BUN/Creatinine Ratio: 15 (ref 9–23)
BUN: 13 mg/dL (ref 6–24)
Bilirubin Total: 0.5 mg/dL (ref 0.0–1.2)
CO2: 23 mmol/L (ref 20–29)
Calcium: 10 mg/dL (ref 8.7–10.2)
Chloride: 101 mmol/L (ref 96–106)
Creatinine, Ser: 0.85 mg/dL (ref 0.57–1.00)
Globulin, Total: 2 g/dL (ref 1.5–4.5)
Glucose: 109 mg/dL — ABNORMAL HIGH (ref 70–99)
Potassium: 4.3 mmol/L (ref 3.5–5.2)
Sodium: 139 mmol/L (ref 134–144)
Total Protein: 6.8 g/dL (ref 6.0–8.5)
eGFR: 81 mL/min/{1.73_m2} (ref >59)

## 2023-10-24 ENCOUNTER — Encounter: Payer: Self-pay | Admitting: Emergency Medicine

## 2023-11-21 ENCOUNTER — Other Ambulatory Visit: Payer: Self-pay | Admitting: Cardiovascular Disease

## 2023-11-21 DIAGNOSIS — E785 Hyperlipidemia, unspecified: Secondary | ICD-10-CM

## 2023-12-14 ENCOUNTER — Other Ambulatory Visit: Payer: Self-pay | Admitting: Cardiovascular Disease

## 2024-02-07 ENCOUNTER — Other Ambulatory Visit: Payer: Self-pay | Admitting: Cardiovascular Disease

## 2024-02-07 DIAGNOSIS — I25118 Atherosclerotic heart disease of native coronary artery with other forms of angina pectoris: Secondary | ICD-10-CM

## 2024-02-08 NOTE — Telephone Encounter (Signed)
 Refill sent as requested.

## 2024-09-07 ENCOUNTER — Other Ambulatory Visit: Payer: Self-pay | Admitting: Cardiovascular Disease

## 2024-10-18 ENCOUNTER — Telehealth: Payer: Self-pay | Admitting: Cardiovascular Disease

## 2024-10-18 NOTE — Telephone Encounter (Signed)
*  STAT* If patient is at the pharmacy, call can be transferred to refill team.   1. Which medications need to be refilled? (please list name of each medication and dose if known)   lisinopril -hydrochlorothiazide  (ZESTORETIC ) 20-12.5 MG tablet     4. Which pharmacy/location (including street and city if local pharmacy) is medication to be sent to?  [facility] - [place], [place] - [address]     5. Do they need a 30 day or 90 day supply? 90    Sch 11/30/24

## 2024-10-19 MED ORDER — LISINOPRIL-HYDROCHLOROTHIAZIDE 20-12.5 MG PO TABS: ORAL_TABLET | ORAL | 0 refills | Status: DC

## 2024-10-19 NOTE — Telephone Encounter (Signed)
 Pt scheduled to see Dr. Gollan 11/30/24.  Refill sent.

## 2024-11-01 ENCOUNTER — Emergency Department

## 2024-11-01 ENCOUNTER — Emergency Department
Admission: EM | Admit: 2024-11-01 | Discharge: 2024-11-01 | Disposition: A | Discharge status: Home / Self Care | Attending: Emergency Medicine | Admitting: Emergency Medicine

## 2024-11-01 ENCOUNTER — Other Ambulatory Visit: Payer: Self-pay

## 2024-11-01 DIAGNOSIS — I10 Essential (primary) hypertension: Secondary | ICD-10-CM

## 2024-11-01 DIAGNOSIS — I2510 Atherosclerotic heart disease of native coronary artery without angina pectoris: Secondary | ICD-10-CM | POA: Diagnosis not present

## 2024-11-01 DIAGNOSIS — I110 Hypertensive heart disease with heart failure: Secondary | ICD-10-CM | POA: Insufficient documentation

## 2024-11-01 DIAGNOSIS — I509 Heart failure, unspecified: Secondary | ICD-10-CM | POA: Insufficient documentation

## 2024-11-01 DIAGNOSIS — R112 Nausea with vomiting, unspecified: Secondary | ICD-10-CM | POA: Diagnosis not present

## 2024-11-01 DIAGNOSIS — K5900 Constipation, unspecified: Secondary | ICD-10-CM | POA: Diagnosis not present

## 2024-11-01 LAB — COMPREHENSIVE METABOLIC PANEL WITH GFR
ALT: 18 U/L (ref 0–44)
AST: 25 U/L (ref 15–41)
Albumin: 4.8 g/dL (ref 3.5–5.0)
Alkaline Phosphatase: 109 U/L (ref 38–126)
Anion gap: 12 (ref 5–15)
BUN: 15 mg/dL (ref 6–20)
CO2: 27 mmol/L (ref 22–32)
Calcium: 10.3 mg/dL (ref 8.9–10.3)
Chloride: 95 mmol/L — ABNORMAL LOW (ref 98–111)
Creatinine, Ser: 0.88 mg/dL (ref 0.44–1.00)
GFR, Estimated: >60 mL/min (ref >60)
Glucose, Bld: 188 mg/dL — ABNORMAL HIGH (ref 70–99)
Potassium: 4.7 mmol/L (ref 3.5–5.1)
Sodium: 134 mmol/L — ABNORMAL LOW (ref 135–145)
Total Bilirubin: 0.5 mg/dL (ref 0.0–1.2)
Total Protein: 7.6 g/dL (ref 6.5–8.1)

## 2024-11-01 LAB — CBC
HCT: 46.2 % — ABNORMAL HIGH (ref 36.0–46.0)
Hemoglobin: 16 g/dL — ABNORMAL HIGH (ref 12.0–15.0)
MCH: 31.9 pg (ref 26.0–34.0)
MCHC: 34.6 g/dL (ref 30.0–36.0)
MCV: 92.2 fL (ref 80.0–100.0)
Platelets: 214 K/uL (ref 150–400)
RBC: 5.01 MIL/uL (ref 3.87–5.11)
RDW: 11.9 % (ref 11.5–15.5)
WBC: 8.6 K/uL (ref 4.0–10.5)
nRBC: 0 % (ref 0.0–0.2)

## 2024-11-01 LAB — RESP PANEL BY RT-PCR (RSV, FLU A&B, COVID)  RVPGX2
Influenza A by PCR: NEGATIVE
Influenza B by PCR: NEGATIVE
Resp Syncytial Virus by PCR: NEGATIVE
SARS Coronavirus 2 by RT PCR: NEGATIVE

## 2024-11-01 LAB — LIPASE, BLOOD: Lipase: 20 U/L (ref 11–51)

## 2024-11-01 MED ORDER — ONDANSETRON HCL 4 MG/2ML IJ SOLN
4.0000 mg | Freq: Once | INTRAMUSCULAR | Status: AC
  Administered 2024-11-01: 4 mg via INTRAVENOUS
  Filled 2024-11-01: qty 2

## 2024-11-01 MED ORDER — METOCLOPRAMIDE HCL 10 MG PO TABS: 10.0000 mg | ORAL_TABLET | Freq: Three times a day (TID) | ORAL | 1 refills | Status: AC

## 2024-11-01 MED ORDER — ONDANSETRON 4 MG PO TBDP: 4.0000 mg | ORAL_TABLET | Freq: Three times a day (TID) | ORAL | 0 refills | Status: AC | PRN

## 2024-11-01 MED ORDER — LABETALOL HCL 5 MG/ML IV SOLN
10.0000 mg | Freq: Once | INTRAVENOUS | Status: AC
  Administered 2024-11-01: 10 mg via INTRAVENOUS
  Filled 2024-11-01: qty 4

## 2024-11-01 MED ORDER — DROPERIDOL 2.5 MG/ML IJ SOLN
1.2500 mg | Freq: Once | INTRAMUSCULAR | Status: AC
  Administered 2024-11-01: 1.25 mg via INTRAVENOUS
  Filled 2024-11-01: qty 2

## 2024-11-01 NOTE — ED Notes (Signed)
Pt given graham crackers and sprite for PO challenge.

## 2024-11-01 NOTE — ED Notes (Signed)
 See triage note. Pt reports severe nausea, states feels similar to having norovirus. Denies fever. States was at walk in clinic and sent to ED d/t HTN. Reports threw up bp meds this am.

## 2024-11-01 NOTE — Discharge Instructions (Addendum)
 Please use this Zofran  medication (the dissolvable 1) first if you are still vomiting after that, please take 1 dose of Reglan .  You may take both of these medications 1 time within an 8-hour period.

## 2024-11-01 NOTE — ED Provider Notes (Signed)
 [facility] Provider Note   Event Date/Time   First MD Initiated Contact with Patient 11/01/24 1219     (approximate) History  Hypertension  HPI Victoria Avery is a 58 y.o. female with past medical history of bleeding hypertension, hyperlipidemia, CAD, and CHF who presents complaining of nausea/vomiting over the last week.  Patient presented to the walk-in clinic was sent to our emergency department due to hypertension as patient has been unable to take her antihypertensive medication without throwing them up.  Patient also endorses intermittent headaches.  Patient denies any abdominal pain.  Patient denies any recent travel, sick contacts, or food out of the ordinary.  Patient denies any medication changes recently. ROS: Patient currently denies any vision changes, tinnitus, difficulty speaking, facial droop, sore throat, chest pain, shortness of breath, abdominal pain, diarrhea, dysuria, or weakness/numbness/paresthesias in any extremity   Physical Exam  Triage Vital Signs: ED Triage Vitals  Encounter Vitals Group     BP 11/01/24 1118 (!) 204/114     Girls Systolic BP Percentile --      Girls Diastolic BP Percentile --      Boys Systolic BP Percentile --      Boys Diastolic BP Percentile --      Pulse Rate 11/01/24 1118 93     Resp 11/01/24 1118 20     Temp 11/01/24 1118 (!) 97.5 F (36.4 C)     Temp Source 11/01/24 1118 Oral     SpO2 11/01/24 1118 98 %     Weight --      Height --      Head Circumference --      Peak Flow --      Pain Score 11/01/24 1119 3     Pain Loc --      Pain Education --      Exclude from Growth Chart --    Most recent vital signs: Vitals:   11/01/24 1400 11/01/24 1430  BP: (!) 175/89 (!) 178/88  Pulse: 93 95  Resp:    Temp:    SpO2:     General: Awake, oriented x4. CV:  Good peripheral perfusion. Resp:  Normal effort. Abd:  No distention. Other:  Middle-aged overweight Caucasian female resting comfortably in no  acute distress ED Results / Procedures / Treatments  Labs (all labs ordered are listed, but only abnormal results are displayed) Labs Reviewed  COMPREHENSIVE METABOLIC PANEL WITH GFR - Abnormal; Notable for the following components:      Result Value   Sodium 134 (*)    Chloride 95 (*)    Glucose, Bld 188 (*)    All other components within normal limits  CBC - Abnormal; Notable for the following components:   Hemoglobin 16.0 (*)    HCT 46.2 (*)    All other components within normal limits  RESP PANEL BY RT-PCR (RSV, FLU A&B, COVID)  RVPGX2  LIPASE, BLOOD   EKG ED ECG REPORT I, Artist MARLA Kerns, the attending physician, personally viewed and interpreted this ECG. Date: 11/01/2024 EKG Time: 1241 Rate: 90 Rhythm: normal sinus rhythm QRS Axis: normal Intervals: normal ST/T Wave abnormalities: normal Narrative Interpretation: no evidence of acute ischemia RADIOLOGY ED MD interpretation: Single view x-ray of the abdomen shows no evidence of acute abnormalities.  There is incidentally found mild fecal retention over the right colon - All radiology independently interpreted and agree with radiology assessment Official radiology report(s): No results found. PROCEDURES: Critical Care performed: No Procedures  MEDICATIONS ORDERED IN ED: Medications  ondansetron  (ZOFRAN ) injection 4 mg (4 mg Intravenous Given 11/01/24 1258)  droperidol  (INAPSINE ) 2.5 MG/ML injection 1.25 mg (1.25 mg Intravenous Given 11/01/24 1352)  labetalol  (NORMODYNE ) injection 10 mg (10 mg Intravenous Given 11/01/24 1352)   IMPRESSION / MDM / ASSESSMENT AND PLAN / ED COURSE  I reviewed the triage vital signs and the nursing notes.                             The patient is on the cardiac monitor to evaluate for evidence of arrhythmia and/or significant heart rate changes. Patient's presentation is most consistent with acute presentation with potential threat to life or bodily function. Patient is a 58 year old  female with the above-stated past medical history presents complaining of hypertension after nausea and vomiting over the last week causing her to vomit the his antihypertensive medications. DDx: Gastroenteritis, small bowel obstruction, biliary disease, appendicitis Plan: CBC, CMP, UA, RVP, EKG, KUB  Patient's radiologic and laboratory evaluation only shows mild constipation.  Patient is p.o. tolerant after antiemetic treatment in our emergency department.  Patient was given labetalol  with improvement of her blood pressure as well.  Patient was able to take her home blood pressure medicines after p.o. tolerance.  Patient agrees with plan for discharge at this time with outpatient follow-up.  Patient given strict return precautions and all questions answered prior to discharge.  Dispo: Discharge home with PCP follow-up   FINAL CLINICAL IMPRESSION(S) / ED DIAGNOSES   Final diagnoses:  Nausea and vomiting, unspecified vomiting type  Hypertension, unspecified type   Rx / DC Orders   ED Discharge Orders          Ordered    Ambulatory Referral to Primary Care (Establish Care)       Comments: New onset diabetes   11/01/24 1524    ondansetron  (ZOFRAN -ODT) 4 MG disintegrating tablet  Every 8 hours PRN        11/01/24 1524    metoCLOPramide  (REGLAN ) 10 MG tablet  3 times daily with meals        11/01/24 1524           Note:  This document was prepared using Dragon voice recognition software and may include unintentional dictation errors.   Rondi Ivy K, MD 11/02/24 [phone]

## 2024-11-01 NOTE — ED Triage Notes (Signed)
 Pt to ED via POV from home. Pt reports has been having N/V. Pt went to walk in but sent due to HTN. Pt reports has been taking BP meds. Pt reports intermittent HA.

## 2024-11-06 ENCOUNTER — Encounter: Payer: Self-pay | Admitting: *Deleted

## 2024-11-06 ENCOUNTER — Emergency Department
Admission: EM | Admit: 2024-11-06 | Discharge: 2024-11-06 | Disposition: A | Discharge status: Home / Self Care | Attending: Emergency Medicine | Admitting: Emergency Medicine

## 2024-11-06 ENCOUNTER — Other Ambulatory Visit: Payer: Self-pay

## 2024-11-06 ENCOUNTER — Emergency Department

## 2024-11-06 DIAGNOSIS — K760 Fatty (change of) liver, not elsewhere classified: Secondary | ICD-10-CM | POA: Diagnosis not present

## 2024-11-06 DIAGNOSIS — R1032 Left lower quadrant pain: Secondary | ICD-10-CM | POA: Diagnosis not present

## 2024-11-06 DIAGNOSIS — R110 Nausea: Secondary | ICD-10-CM | POA: Diagnosis not present

## 2024-11-06 DIAGNOSIS — R109 Unspecified abdominal pain: Secondary | ICD-10-CM | POA: Diagnosis not present

## 2024-11-06 DIAGNOSIS — E871 Hypo-osmolality and hyponatremia: Secondary | ICD-10-CM | POA: Diagnosis not present

## 2024-11-06 DIAGNOSIS — I10 Essential (primary) hypertension: Secondary | ICD-10-CM | POA: Diagnosis not present

## 2024-11-06 DIAGNOSIS — K5732 Diverticulitis of large intestine without perforation or abscess without bleeding: Secondary | ICD-10-CM | POA: Diagnosis not present

## 2024-11-06 DIAGNOSIS — R000 Tachycardia, unspecified: Secondary | ICD-10-CM | POA: Diagnosis not present

## 2024-11-06 DIAGNOSIS — K5792 Diverticulitis of intestine, part unspecified, without perforation or abscess without bleeding: Secondary | ICD-10-CM | POA: Diagnosis not present

## 2024-11-06 DIAGNOSIS — K5730 Diverticulosis of large intestine without perforation or abscess without bleeding: Secondary | ICD-10-CM | POA: Diagnosis not present

## 2024-11-06 DIAGNOSIS — R519 Headache, unspecified: Secondary | ICD-10-CM | POA: Diagnosis not present

## 2024-11-06 LAB — URINALYSIS, ROUTINE W REFLEX MICROSCOPIC
Bilirubin Urine: NEGATIVE
Glucose, UA: NEGATIVE mg/dL
Hgb urine dipstick: NEGATIVE
Ketones, ur: NEGATIVE mg/dL
Leukocytes,Ua: NEGATIVE
Nitrite: NEGATIVE
Protein, ur: NEGATIVE mg/dL
Specific Gravity, Urine: 1.033 — ABNORMAL HIGH (ref 1.005–1.030)
pH: 7 (ref 5.0–8.0)

## 2024-11-06 LAB — COMPREHENSIVE METABOLIC PANEL WITH GFR
ALT: 16 U/L (ref 0–44)
AST: 17 U/L (ref 15–41)
Albumin: 4.2 g/dL (ref 3.5–5.0)
Alkaline Phosphatase: 97 U/L (ref 38–126)
Anion gap: 14 (ref 5–15)
BUN: 13 mg/dL (ref 6–20)
CO2: 25 mmol/L (ref 22–32)
Calcium: 9.8 mg/dL (ref 8.9–10.3)
Chloride: 88 mmol/L — ABNORMAL LOW (ref 98–111)
Creatinine, Ser: 0.84 mg/dL (ref 0.44–1.00)
GFR, Estimated: >60 mL/min (ref >60)
Glucose, Bld: 130 mg/dL — ABNORMAL HIGH (ref 70–99)
Potassium: 3.6 mmol/L (ref 3.5–5.1)
Sodium: 127 mmol/L — ABNORMAL LOW (ref 135–145)
Total Bilirubin: 0.6 mg/dL (ref 0.0–1.2)
Total Protein: 7.1 g/dL (ref 6.5–8.1)

## 2024-11-06 LAB — CBC
HCT: 44.8 % (ref 36.0–46.0)
Hemoglobin: 15.9 g/dL — ABNORMAL HIGH (ref 12.0–15.0)
MCH: 32.1 pg (ref 26.0–34.0)
MCHC: 35.5 g/dL (ref 30.0–36.0)
MCV: 90.5 fL (ref 80.0–100.0)
Platelets: 222 K/uL (ref 150–400)
RBC: 4.95 MIL/uL (ref 3.87–5.11)
RDW: 11.8 % (ref 11.5–15.5)
WBC: 13.6 K/uL — ABNORMAL HIGH (ref 4.0–10.5)
nRBC: 0 % (ref 0.0–0.2)

## 2024-11-06 LAB — LIPASE, BLOOD: Lipase: 20 U/L (ref 11–51)

## 2024-11-06 LAB — TROPONIN T, HIGH SENSITIVITY: Troponin T High Sensitivity: 19 ng/L (ref 0–19)

## 2024-11-06 LAB — LACTIC ACID, PLASMA: Lactic Acid, Venous: 0.8 mmol/L (ref 0.5–1.9)

## 2024-11-06 MED ORDER — SODIUM CHLORIDE 0.9 % IV BOLUS
1000.0000 mL | Freq: Once | INTRAVENOUS | Status: AC
  Administered 2024-11-06: 1000 mL via INTRAVENOUS

## 2024-11-06 MED ORDER — SODIUM CHLORIDE 0.9 % IV SOLN
3.0000 g | Freq: Once | INTRAVENOUS | Status: AC
  Administered 2024-11-06: 3 g via INTRAVENOUS
  Filled 2024-11-06: qty 8

## 2024-11-06 MED ORDER — AMOXICILLIN-POT CLAVULANATE ER 1000-62.5 MG PO TB12: 2.0000 | ORAL_TABLET | Freq: Two times a day (BID) | ORAL | 0 refills | Status: AC

## 2024-11-06 MED ORDER — IOHEXOL 300 MG/ML  SOLN
100.0000 mL | Freq: Once | INTRAMUSCULAR | Status: AC | PRN
  Administered 2024-11-06: 100 mL via INTRAVENOUS

## 2024-11-06 NOTE — Discharge Instructions (Addendum)
 We are starting you on medications to help with diverticulitis.  If you develop inability to keep medications down, worsening nausea, vomiting unable to keep fluids down please return to the ER for evaluation.  We discussed admission to the hospital to monitor your sodium levels but you preferred outpatient management.  Is important to try a liquid diet with some bowel rest although I would start very well-hydrated with Pedialyte, Gatorade without sugar to try to keep your electrolytes elevated.  Your sodium level should be rechecked in 1 to 2 days and if downtrending you will need to return to the ER for repeat evaluation.   IMPRESSION: 1. Advanced diverticular disease of the left colon with mild wall thickening and fat stranding at the distal descending and sigmoid colon, suspicious for acute diverticulitis. No perforation or abscess. 2. Upper normal size of appendix with equivocal mild fat stranding, correlate for any signs or symptoms referable to the right lower quadrant. 3. Hepatic steatosis. Vague 17 mm hypodensity in the inferior right hepatic lobe, indeterminate. When the patient is clinically stable and able to follow directions and hold their breath (preferably as an outpatient) further evaluation with dedicated abdominal MRI should be considered. 4. Aortic atherosclerosis.

## 2024-11-06 NOTE — ED Triage Notes (Addendum)
 Pt ambulatory to triage.  Pt reports nausea and abnormal labs.  Pt was seen here last week with nausea. Pt had labs drawn yesterday and reports abnormal sodium and WBC per pt   Pt alert  speech clear.

## 2024-11-06 NOTE — ED Provider Notes (Signed)
 [facility] Provider Note    Event Date/Time   First MD Initiated Contact with Patient 11/06/24 1752     (approximate)   History   Nausea   HPI  Victoria Avery is a 58 y.o. female with hypertension who comes in with concerns for abnormal blood work.  Patient was seen on 11/01/2024 for nausea, vomiting.  X-ray shows concerns for some possible stool retention patient started on Zofran .  She reports that she has not been vomiting but she is been dry heaving.  The nausea medicine seems to have helped but she went to go follow-up at the office yesterday and her white count was 14 and sodium was low therefore she was sent here for evaluation.  She denies any chest pain, shortness of breath.  She does report a little bit of left lower quadrant abdominal pain.  Denies any falls and hitting her head.  She reports that only feeling nauseous but she does not only have anything on her stomach right now.  Physical Exam   Triage Vital Signs: ED Triage Vitals [11/06/24 1641]  Encounter Vitals Group     BP 122/87     Girls Systolic BP Percentile      Girls Diastolic BP Percentile      Boys Systolic BP Percentile      Boys Diastolic BP Percentile      Pulse Rate (!) 114     Resp 20     Temp 98 F (36.7 C)     Temp Source Oral     SpO2 97 %     Weight 170 lb (77.1 kg)     Height 5' 2 (1.575 m)     Head Circumference      Peak Flow      Pain Score 0     Pain Loc      Pain Education      Exclude from Growth Chart     Most recent vital signs: Vitals:   11/06/24 1641  BP: 122/87  Pulse: (!) 114  Resp: 20  Temp: 98 F (36.7 C)  SpO2: 97%     General: Awake, no distress.  CV:  Good peripheral perfusion.  Tachycardic Resp:  Normal effort.  Abd:  No distention.  Tenderness to the left lower quadrant Other:     ED Results / Procedures / Treatments   Labs (all labs ordered are listed, but only abnormal results are displayed) Labs Reviewed   COMPREHENSIVE METABOLIC PANEL WITH GFR - Abnormal; Notable for the following components:      Result Value   Sodium 127 (*)    Chloride 88 (*)    Glucose, Bld 130 (*)    All other components within normal limits  CBC - Abnormal; Notable for the following components:   WBC 13.6 (*)    Hemoglobin 15.9 (*)    All other components within normal limits  LIPASE, BLOOD  URINALYSIS, ROUTINE W REFLEX MICROSCOPIC     EKG  My interpretation of EKG:  Normal sinus rhythm 92 without any ST elevation, T wave version in V2, normal intervals  RADIOLOGY I have reviewed the CT personally and interpreted no ICH or mass    PROCEDURES:  Critical Care performed: No  Procedures   MEDICATIONS ORDERED IN ED: Medications  Ampicillin -Sulbactam (UNASYN ) 3 g in sodium chloride  0.9 % 100 mL IVPB (has no administration in time range)  sodium chloride  0.9 % bolus 1,000 mL (1,000 mLs Intravenous New  Bag/Given 11/06/24 1850)  iohexol  (OMNIPAQUE ) 300 MG/ML solution 100 mL (100 mLs Intravenous Contrast Given 11/06/24 1822)     IMPRESSION / MDM / ASSESSMENT AND PLAN / ED COURSE  I reviewed the triage vital signs and the nursing notes.   Patient's presentation is most consistent with acute presentation with potential threat to life or bodily function.   Differential includes intracranial mass, diverticulitis, perforation, abscess we will proceed with CT imaging to further evaluate and check blood work to evaluate for electrolyte abnormalities, AKI.  Declines any Zofran .    CT head negative  IMPRESSION: 1. Advanced diverticular disease of the left colon with mild wall thickening and fat stranding at the distal descending and sigmoid colon, suspicious for acute diverticulitis. No perforation or abscess. 2. Upper normal size of appendix with equivocal mild fat stranding, correlate for any signs or symptoms referable to the right lower quadrant. 3. Hepatic steatosis. Vague 17 mm hypodensity in the  inferior right hepatic lobe, indeterminate. When the patient is clinically stable and able to follow directions and hold their breath (preferably as an outpatient) further evaluation with dedicated abdominal MRI should be considered. 4. Aortic atherosclerosis.    7:20 PM reevaluated patient she has got no right lower quadrant tenderness.  Therefore I discussed with patient the CT imaging and I have low since her concerns for appendicitis.  Her pain is on the left side which is where they are noticing the diverticulitis so I suspect her elevated white count is related to this.  Discussed with patient admission to the hospital for IV antibiotics, monitoring her sodium levels.  Patient declined states she preferred to go home and follow-up outpatient with her primary care doctor.  This is reasonable but we did discuss liquid diet especially pushing Pedialyte, Gatorade without sugar as well as antibiotics for treatment for diverticulitis but she wanted to try this outpatient and plan for a recheck of sodium in 2 days and if sodium levels are decreasing further she will come back to the hospital for admission.  Incidental findings provided for patient and discussed with patient  Urine negative.  Patient was initially tachycardic but heart rates have come down.  She feels much improved she is tolerating p.o. and she prefers discharge home. Declined need for more zofran .      FINAL CLINICAL IMPRESSION(S) / ED DIAGNOSES   Final diagnoses:  Diverticulitis  Hyponatremia     Rx / DC Orders   ED Discharge Orders          Ordered    amoxicillin -clavulanate (AUGMENTIN  XR) 1000-62.5 MG 12 hr tablet  2 times daily        11/06/24 1947             Note:  This document was prepared using Dragon voice recognition software and may include unintentional dictation errors.   Ernest Ronal BRAVO, MD 11/06/24 [phone]

## 2024-11-06 NOTE — ED Triage Notes (Signed)
 First nurse note: pt sent from [facility] for [facility] labs. Reports low Na+

## 2024-11-11 LAB — CULTURE, BLOOD (ROUTINE X 2)
Culture: NO GROWTH
Culture: NO GROWTH
Special Requests: ADEQUATE

## 2024-11-14 ENCOUNTER — Other Ambulatory Visit: Payer: Self-pay | Admitting: Cardiovascular Disease

## 2024-11-14 DIAGNOSIS — E785 Hyperlipidemia, unspecified: Secondary | ICD-10-CM

## 2024-11-29 NOTE — Progress Notes (Unsigned)
 Cardiology Office Note  Date:  11/30/2024   ID:  Victoria Avery, DOB 02/26/1966, MRN [id number]  PCP:  Pcp, No   Chief Complaint  Patient presents with   Follow-up    12 month f/u no complaints today. Meds reviewed verbally with pt.    HPI:  Victoria Avery is a 59 y.o. female with a past medical history of malignant hypertension smoking, who continues to smoke 1 pack/day CAD, CABG x3,  Feb 2021 Who presents for f/u of her CAD, CABG  Last seen by myself in clinic 11/24  Seen in the emergency room November 06, 2024 nausea  Seen in the emergency room November 01, 2024 hypertension, nausea vomiting Reports throwing up her blood pressure medication Blood pressure 204/114 CT scan abdomen advanced diverticular disease left colon  Works at [employer] Denies shortness of breath or chest pain on exertion No lower extremity edema  Continues to smoke 1 pack/day Tried chantix , unable to quit, unable to tolerate vapor cigarette  Does not have primary care physician  Does lab work through work Labs reviewed No recent lipid panel Tolerating Lipitor  80 daily  EKG personally reviewed by myself on todays visit EKG Interpretation Date/Time:  Friday November 30 2024 09:41:56 EST Ventricular Rate:  87 PR Interval:  136 QRS Duration:  84 QT Interval:  354 QTC Calculation: 425 R Axis:   78  Text Interpretation: Normal sinus rhythm ST & T wave abnormality, consider inferior ischemia When compared with ECG of 06-Nov-2024 18:54, T wave inversion now evident in Inferior leads T wave inversion no longer evident in Anterolateral leads Confirmed by Perla Lye ([id number]) on 11/30/2024 9:56:42 AM    Past medical history reviewed  [facility] 01/13/2020 reporting chest pain.   ED EKG showed ST dependent depression with positive troponin.  Ruled in for NSTEMI.    Cardiac cath 01/14/2020 with mildly reduced LVEF and multivessel CAD.   Transferred to [facility] for CABG.    Underwent CABG with LIMA to LAD, R SVG to diagonal 1, SVG to PDA.  discharged 01/21/2020.  Cardiac cath 01/14/20 There is mild left ventricular systolic dysfunction. LV end diastolic pressure is normal. The left ventricular ejection fraction is 45-50% by visual estimate. Prox RCA lesion is 90% stenosed. Prox RCA to Mid RCA lesion is 100% stenosed. Mid Cx to Dist Cx lesion is 99% stenosed. Mid LAD-1 lesion is 90% stenosed. Mid LAD-2 lesion is 60% stenosed. 1st Diag-1 lesion is 85% stenosed. 1st Diag-2 lesion is 70% stenosed.   1.  Severe three-vessel coronary artery disease with chronic occlusion of the RCA with left-to-right collaterals, subtotal occlusion of the distal left circumflex supplying small OM branches and significant mid LAD stenosis and significant disease in ostial large first diagonal.  OM branches are likely too small to graft. 2.  Mildly reduced LV systolic function with an EF of 45 to 50% with inferior wall hypokinesis. 3.  Normal left ventricular end-diastolic pressure.  Echo 01/14/20  1. Left ventricular ejection fraction, by estimation, is 50 to 55%. The  left ventricle has low normal function. LV endocardial border not  optimally defined to evaluate regional wall motion. Left ventricular  diastolic parameters were normal.   2. Right ventricular systolic function is normal. The right ventricular  size is normal. Tricuspid regurgitation signal is inadequate for assessing  PA pressure.   Echo2/15/2021 Left ventricular ejection fraction, by estimation, is 50 to 55%. The  left ventricle has low normal function. LV endocardial border not  optimally defined to evaluate regional wall motion. Left ventricular  diastolic parameters were normal.    PMH:   has a past medical history of Cardiomyopathy (HCC), Hypertension, and Tobacco abuse.  PSH:    Past Surgical History:  Procedure Laterality Date   CARDIAC CATHETERIZATION     CESAREAN SECTION     CORONARY ARTERY  BYPASS GRAFT N/A 01/18/2020   Procedure: OFF PUMP CORONARY ARTERY BYPASS GRAFTING (CABG) times three using left internal mammary artery and right greater saphenous vein harvested endoscopically.;  Surgeon: Shyrl Linnie KIDD, MD;  Location: [facility] OR;  Service: Open Heart Surgery;  Laterality: N/A;   LEFT HEART CATH AND CORONARY ANGIOGRAPHY N/A 01/14/2020   Procedure: LEFT HEART CATH AND CORONARY ANGIOGRAPHY;  Surgeon: Darron Deatrice LABOR, MD;  Location: [facility] INVASIVE CV LAB;  Service: Cardiovascular;  Laterality: N/A;   TEE WITHOUT CARDIOVERSION N/A 01/18/2020   Procedure: TRANSESOPHAGEAL ECHOCARDIOGRAM (TEE);  Surgeon: Shyrl Linnie KIDD, MD;  Location: [facility] OR;  Service: Open Heart Surgery;  Laterality: N/A;    Current Outpatient Medications  Medication Sig Dispense Refill   acetaminophen  (TYLENOL ) 500 MG tablet Take 2 tablets (1,000 mg total) by mouth every 6 (six) hours as needed. 30 tablet 0   aspirin  EC 81 MG tablet Take 1 tablet (81 mg total) by mouth daily. Swallow whole. 90 tablet 3   atorvastatin  (LIPITOR ) 80 MG tablet TAKE 1 TABLET DAILY AT 6 P.M. 90 tablet 0   lisinopril -hydrochlorothiazide  (ZESTORETIC ) 20-12.5 MG tablet TAKE 2 TABLETS ONCE DAILY/Due for follow up. 180 tablet 0   metoCLOPramide  (REGLAN ) 10 MG tablet Take 1 tablet (10 mg total) by mouth 3 (three) times daily with meals. (Patient taking differently: Take 10 mg by mouth as needed.) 90 tablet 1   metoprolol  succinate (TOPROL -XL) 50 MG 24 hr tablet TAKE 1 TABLET DAILY WITH OR IMMEDIATELY FOLLOWING A MEAL 90 tablet 0   nitroGLYCERIN  (NITROSTAT ) 0.4 MG SL tablet PLACE 1 TABLET UNDER THE TONGUE EVERY 5 MINUTES AS NEEDED FOR CHEST PAIN. 25 tablet 3   ondansetron  (ZOFRAN -ODT) 4 MG disintegrating tablet Take 1 tablet (4 mg total) by mouth every 8 (eight) hours as needed for nausea or vomiting. 20 tablet 0   promethazine  (PHENERGAN ) 25 MG suppository Place 1 suppository (25 mg total) rectally every 8 (eight) hours as needed for  refractory nausea / vomiting. 12 each 0   No current facility-administered medications for this visit.    Allergies:   Patient has no known allergies.   Social History:  The patient  reports that she has been smoking cigarettes. She has a 15 pack-year smoking history. She has never used smokeless tobacco. She reports current alcohol use. She reports that she does not use drugs.   Family History:   family history includes CAD in her mother; Hypertension in her mother.    Review of Systems: Review of Systems  Constitutional: Negative.   HENT: Negative.    Respiratory: Negative.    Cardiovascular: Negative.   Gastrointestinal: Negative.   Musculoskeletal: Negative.   Neurological: Negative.   Psychiatric/Behavioral: Negative.    All other systems reviewed and are negative.   PHYSICAL EXAM: VS:  BP 138/74 (BP Location: Left Arm, Patient Position: Sitting, Cuff Size: Normal)   Pulse 87   Ht 5' 2 (1.575 m)   Wt 168 lb 2 oz (76.3 kg)   LMP 11/25/2017   SpO2 97%   BMI 30.75 kg/m  , BMI Body mass index is 30.75 kg/m. Constitutional:  oriented to  person, place, and time. No distress.  HENT:  Head: Grossly normal Eyes:  no discharge. No scleral icterus.  Neck: No JVD, no carotid bruits  Cardiovascular: Regular rate and rhythm, no murmurs appreciated Pulmonary/Chest: Clear to auscultation bilaterally, no wheezes or rales Abdominal: Soft.  no distension.  no tenderness.  Musculoskeletal: Normal range of motion Neurological:  normal muscle tone. Coordination normal. No atrophy Skin: Skin warm and dry Psychiatric: normal affect, pleasant   Recent Labs: 11/06/2024: ALT 16; BUN 13; Creatinine, Ser 0.84; Hemoglobin 15.9; Platelets 222; Potassium 3.6; Sodium 127    Lipid Panel Lab Results  Component Value Date   CHOL 131 10/18/2023   HDL 35 (L) 10/18/2023   LDLCALC 73 10/18/2023   TRIG 128 10/18/2023      Wt Readings from Last 3 Encounters:  11/30/24 168 lb 2 oz (76.3 kg)   11/06/24 170 lb (77.1 kg)  10/18/23 169 lb 2 oz (76.7 kg)     ASSESSMENT AND PLAN: CAD with stable angina S/p CABG Recommended smoking cessation Lipid panel ordered on Lipitor  80, previously at goal Discussed anginal symptoms to watch for A1c 6.2  Smoker Could not tolerate Chantix  this caused nausea Vapor cigarettes caused cough Recommend smoking cessation  Hyperlipidemia Continue Lipitor  80, we have ordered lipid panel  Essential hypertension Blood pressure is well controlled on today's visit. No changes made to the medications. Recent high blood pressure in the emergency room in the setting of abdominal pain, constipation  Shortness of breath Smoking cessation recommended, suggested regular walking program    Orders Placed This Encounter  Procedures   EKG 12-Lead     Signed, Velinda Lunger, M.D., Ph.D. 11/30/2024  [facility] [place], [place] [phone]

## 2024-11-30 ENCOUNTER — Encounter: Payer: Self-pay | Admitting: Cardiovascular Disease

## 2024-11-30 ENCOUNTER — Ambulatory Visit: Attending: Cardiovascular Disease | Admitting: Cardiovascular Disease

## 2024-11-30 VITALS — BP 138/74 | HR 87 | Ht 62.0 in | Wt 168.1 lb

## 2024-11-30 DIAGNOSIS — Z951 Presence of aortocoronary bypass graft: Secondary | ICD-10-CM | POA: Diagnosis not present

## 2024-11-30 DIAGNOSIS — E785 Hyperlipidemia, unspecified: Secondary | ICD-10-CM

## 2024-11-30 DIAGNOSIS — I214 Non-ST elevation (NSTEMI) myocardial infarction: Secondary | ICD-10-CM | POA: Diagnosis not present

## 2024-11-30 DIAGNOSIS — I519 Heart disease, unspecified: Secondary | ICD-10-CM | POA: Diagnosis not present

## 2024-11-30 DIAGNOSIS — I10 Essential (primary) hypertension: Secondary | ICD-10-CM | POA: Diagnosis not present

## 2024-11-30 DIAGNOSIS — F17200 Nicotine dependence, unspecified, uncomplicated: Secondary | ICD-10-CM | POA: Diagnosis not present

## 2024-11-30 DIAGNOSIS — I160 Hypertensive urgency: Secondary | ICD-10-CM

## 2024-11-30 DIAGNOSIS — I25118 Atherosclerotic heart disease of native coronary artery with other forms of angina pectoris: Secondary | ICD-10-CM | POA: Diagnosis not present

## 2024-11-30 MED ORDER — METOPROLOL SUCCINATE ER 50 MG PO TB24: ORAL_TABLET | ORAL | 3 refills | Status: AC

## 2024-11-30 MED ORDER — LISINOPRIL-HYDROCHLOROTHIAZIDE 20-12.5 MG PO TABS: ORAL_TABLET | ORAL | 3 refills | Status: AC

## 2024-11-30 MED ORDER — ATORVASTATIN CALCIUM 80 MG PO TABS: ORAL_TABLET | ORAL | 3 refills | Status: AC

## 2024-11-30 NOTE — Patient Instructions (Addendum)
 Medication Instructions:  No changes  Look for colace (docusate)/senna  If you need a refill on your cardiac medications before your next appointment, please call your pharmacy.   Lab work: Your provider would like for you to return in the next few weeks to have the following labs drawn: Lipid.   Please go to [facility] [address] ([facility] [address], [place] [address] You do not need an appointment.  They are open from 8 am- 4:30 pm.  Lunch from 1:00 pm- 2:00 pm You will need to be fasting.   You may also go to one of the following [facility]:  [address] [place], [place] [address] Phone: [phone] Lab hours: Mon-Fri 8 am- 5 pm    Lunch 12 pm- 1 pm  [address] [place],  [place]  [address]  [place]  Phone: [phone] Lab hours: 7 am- 4 pm Lunch 12 pm-1 pm   [address] [place],  [place]  [address]  [place]  Phone: [phone] Lab hours: Mon-Fri 8 am- 5 pm    Lunch 12 pm- 1 pm   Testing/Procedures: No new testing needed  Follow-Up: At [facility], you and your health needs are our priority.  As part of our continuing mission to provide you with exceptional heart care, we have created designated Provider Care Teams.  These Care Teams include your primary Cardiologist (physician) and Advanced Practice Providers (APPs -  Physician Assistants and Nurse Practitioners) who all work together to provide you with the care you need, when you need it.  You will need a follow up appointment in 12 months  Providers on your designated Care Team:   Lonni Meager, NP Bernardino Bring, PA-C [place], [place]  COVID-19 Vaccine Information can be found at: [email or web address] For questions related to vaccine distribution or appointments, please email [email or web address] or call [phone].

## 2024-12-20 ENCOUNTER — Encounter: Payer: Self-pay | Admitting: Emergency Medicine
